# Patient Record
Sex: Female | Born: 1955 | Race: Black or African American | Hispanic: No | Marital: Married | Smoking: Never smoker
Health system: Southern US, Community
[De-identification: ages and names within clinical notes are randomized; demographics above are authoritative.]

## PROBLEM LIST (undated history)

## (undated) DIAGNOSIS — D649 Anemia, unspecified: Secondary | ICD-10-CM

## (undated) DIAGNOSIS — E785 Hyperlipidemia, unspecified: Secondary | ICD-10-CM

## (undated) DIAGNOSIS — IMO0001 Reserved for inherently not codable concepts without codable children: Secondary | ICD-10-CM

## (undated) DIAGNOSIS — F419 Anxiety disorder, unspecified: Secondary | ICD-10-CM

## (undated) DIAGNOSIS — M109 Gout, unspecified: Secondary | ICD-10-CM

## (undated) DIAGNOSIS — Z794 Long term (current) use of insulin: Secondary | ICD-10-CM

## (undated) DIAGNOSIS — F909 Attention-deficit hyperactivity disorder, unspecified type: Secondary | ICD-10-CM

## (undated) DIAGNOSIS — M1990 Unspecified osteoarthritis, unspecified site: Secondary | ICD-10-CM

## (undated) DIAGNOSIS — L219 Seborrheic dermatitis, unspecified: Secondary | ICD-10-CM

## (undated) DIAGNOSIS — G5601 Carpal tunnel syndrome, right upper limb: Secondary | ICD-10-CM

## (undated) DIAGNOSIS — F319 Bipolar disorder, unspecified: Secondary | ICD-10-CM

## (undated) DIAGNOSIS — I10 Essential (primary) hypertension: Secondary | ICD-10-CM

## (undated) DIAGNOSIS — E119 Type 2 diabetes mellitus without complications: Secondary | ICD-10-CM

## (undated) DIAGNOSIS — F329 Major depressive disorder, single episode, unspecified: Secondary | ICD-10-CM

## (undated) DIAGNOSIS — E1140 Type 2 diabetes mellitus with diabetic neuropathy, unspecified: Secondary | ICD-10-CM

## (undated) DIAGNOSIS — K219 Gastro-esophageal reflux disease without esophagitis: Secondary | ICD-10-CM

## (undated) DIAGNOSIS — G4730 Sleep apnea, unspecified: Secondary | ICD-10-CM

## (undated) DIAGNOSIS — H269 Unspecified cataract: Secondary | ICD-10-CM

## (undated) DIAGNOSIS — M2040 Other hammer toe(s) (acquired), unspecified foot: Secondary | ICD-10-CM

## (undated) DIAGNOSIS — F32A Depression, unspecified: Secondary | ICD-10-CM

## (undated) DIAGNOSIS — D509 Iron deficiency anemia, unspecified: Secondary | ICD-10-CM

## (undated) HISTORY — DX: Major depressive disorder, single episode, unspecified: F32.9

## (undated) HISTORY — PX: BREAST REDUCTION SURGERY: SHX8

## (undated) HISTORY — DX: Unspecified osteoarthritis, unspecified site: M19.90

## (undated) HISTORY — DX: Seborrheic dermatitis, unspecified: L21.9

## (undated) HISTORY — DX: Bipolar disorder, unspecified: F31.9

## (undated) HISTORY — DX: Other hammer toe(s) (acquired), unspecified foot: M20.40

## (undated) HISTORY — DX: Depression, unspecified: F32.A

## (undated) HISTORY — DX: Hyperlipidemia, unspecified: E78.5

## (undated) HISTORY — DX: Gastro-esophageal reflux disease without esophagitis: K21.9

## (undated) HISTORY — DX: Iron deficiency anemia, unspecified: D50.9

## (undated) HISTORY — DX: Gout, unspecified: M10.9

## (undated) HISTORY — DX: Anemia, unspecified: D64.9

## (undated) HISTORY — DX: Sleep apnea, unspecified: G47.30

---

## 1998-11-25 ENCOUNTER — Encounter: Admission: RE | Admit: 1998-11-25 | Discharge: 1999-02-23 | Payer: Self-pay | Admitting: Specialist

## 1998-12-31 ENCOUNTER — Ambulatory Visit (HOSPITAL_COMMUNITY): Admission: RE | Admit: 1998-12-31 | Discharge: 1998-12-31 | Payer: Self-pay | Admitting: Obstetrics and Gynecology

## 1998-12-31 ENCOUNTER — Encounter: Payer: Self-pay | Admitting: Obstetrics and Gynecology

## 1999-10-07 ENCOUNTER — Ambulatory Visit (HOSPITAL_COMMUNITY): Admission: RE | Admit: 1999-10-07 | Discharge: 1999-10-07 | Payer: Self-pay | Admitting: Family Medicine

## 1999-10-07 ENCOUNTER — Encounter: Payer: Self-pay | Admitting: Family Medicine

## 1999-11-09 ENCOUNTER — Encounter (INDEPENDENT_AMBULATORY_CARE_PROVIDER_SITE_OTHER): Payer: Self-pay

## 1999-11-09 ENCOUNTER — Other Ambulatory Visit: Admission: RE | Admit: 1999-11-09 | Discharge: 1999-11-09 | Payer: Self-pay | Admitting: Obstetrics and Gynecology

## 2000-01-02 ENCOUNTER — Encounter: Payer: Self-pay | Admitting: Obstetrics and Gynecology

## 2000-01-02 ENCOUNTER — Ambulatory Visit (HOSPITAL_COMMUNITY): Admission: RE | Admit: 2000-01-02 | Discharge: 2000-01-02 | Payer: Self-pay | Admitting: Obstetrics and Gynecology

## 2000-02-27 ENCOUNTER — Encounter: Admission: RE | Admit: 2000-02-27 | Discharge: 2000-05-27 | Payer: Self-pay | Admitting: *Deleted

## 2001-01-03 ENCOUNTER — Encounter: Payer: Self-pay | Admitting: Obstetrics and Gynecology

## 2001-01-03 ENCOUNTER — Ambulatory Visit (HOSPITAL_COMMUNITY): Admission: RE | Admit: 2001-01-03 | Discharge: 2001-01-03 | Payer: Self-pay | Admitting: Obstetrics and Gynecology

## 2001-01-07 ENCOUNTER — Other Ambulatory Visit: Admission: RE | Admit: 2001-01-07 | Discharge: 2001-01-07 | Payer: Self-pay | Admitting: Obstetrics and Gynecology

## 2002-01-13 ENCOUNTER — Other Ambulatory Visit: Admission: RE | Admit: 2002-01-13 | Discharge: 2002-01-13 | Payer: Self-pay | Admitting: Obstetrics and Gynecology

## 2002-01-13 ENCOUNTER — Ambulatory Visit (HOSPITAL_COMMUNITY): Admission: RE | Admit: 2002-01-13 | Discharge: 2002-01-13 | Payer: Self-pay | Admitting: Obstetrics and Gynecology

## 2002-01-13 ENCOUNTER — Encounter: Payer: Self-pay | Admitting: Obstetrics and Gynecology

## 2002-12-19 ENCOUNTER — Ambulatory Visit (HOSPITAL_COMMUNITY): Admission: RE | Admit: 2002-12-19 | Discharge: 2002-12-19 | Payer: Self-pay | Admitting: Family Medicine

## 2002-12-19 ENCOUNTER — Encounter: Payer: Self-pay | Admitting: Family Medicine

## 2003-01-15 ENCOUNTER — Encounter: Payer: Self-pay | Admitting: Obstetrics and Gynecology

## 2003-01-15 ENCOUNTER — Ambulatory Visit (HOSPITAL_COMMUNITY): Admission: RE | Admit: 2003-01-15 | Discharge: 2003-01-15 | Payer: Self-pay | Admitting: Obstetrics and Gynecology

## 2004-01-18 ENCOUNTER — Ambulatory Visit (HOSPITAL_COMMUNITY): Admission: RE | Admit: 2004-01-18 | Discharge: 2004-01-18 | Payer: Self-pay | Admitting: Obstetrics and Gynecology

## 2004-02-03 HISTORY — PX: ESOPHAGOGASTRODUODENOSCOPY: SHX1529

## 2004-02-22 ENCOUNTER — Other Ambulatory Visit: Admission: RE | Admit: 2004-02-22 | Discharge: 2004-02-22 | Payer: Self-pay | Admitting: Obstetrics and Gynecology

## 2004-05-23 ENCOUNTER — Encounter: Payer: Self-pay | Admitting: Gastroenterology

## 2004-05-23 LAB — HM COLONOSCOPY

## 2004-06-03 ENCOUNTER — Encounter: Payer: Self-pay | Admitting: Gastroenterology

## 2004-06-15 ENCOUNTER — Ambulatory Visit (HOSPITAL_COMMUNITY): Admission: RE | Admit: 2004-06-15 | Discharge: 2004-06-15 | Payer: Self-pay | Admitting: Gastroenterology

## 2004-07-11 ENCOUNTER — Ambulatory Visit: Payer: Self-pay | Admitting: Endocrinology

## 2004-11-10 ENCOUNTER — Ambulatory Visit: Payer: Self-pay | Admitting: Endocrinology

## 2004-11-18 ENCOUNTER — Ambulatory Visit: Payer: Self-pay | Admitting: Gastroenterology

## 2005-01-09 ENCOUNTER — Ambulatory Visit (HOSPITAL_COMMUNITY): Admission: RE | Admit: 2005-01-09 | Discharge: 2005-01-09 | Payer: Self-pay | Admitting: Obstetrics and Gynecology

## 2005-01-30 ENCOUNTER — Ambulatory Visit: Payer: Self-pay | Admitting: Endocrinology

## 2005-02-10 ENCOUNTER — Other Ambulatory Visit: Admission: RE | Admit: 2005-02-10 | Discharge: 2005-02-10 | Payer: Self-pay | Admitting: Obstetrics and Gynecology

## 2005-03-28 ENCOUNTER — Emergency Department (HOSPITAL_COMMUNITY): Admission: EM | Admit: 2005-03-28 | Discharge: 2005-03-28 | Payer: Self-pay | Admitting: Emergency Medicine

## 2005-04-07 ENCOUNTER — Ambulatory Visit (HOSPITAL_COMMUNITY): Admission: RE | Admit: 2005-04-07 | Discharge: 2005-04-07 | Payer: Self-pay | Admitting: *Deleted

## 2005-04-12 ENCOUNTER — Encounter: Admission: RE | Admit: 2005-04-12 | Discharge: 2005-07-11 | Payer: Self-pay | Admitting: *Deleted

## 2005-04-24 ENCOUNTER — Ambulatory Visit (HOSPITAL_COMMUNITY): Admission: RE | Admit: 2005-04-24 | Discharge: 2005-04-24 | Payer: Self-pay | Admitting: *Deleted

## 2005-05-22 ENCOUNTER — Ambulatory Visit: Payer: Self-pay | Admitting: Endocrinology

## 2005-05-26 ENCOUNTER — Ambulatory Visit: Payer: Self-pay | Admitting: Psychology

## 2005-05-29 ENCOUNTER — Ambulatory Visit: Payer: Self-pay | Admitting: Endocrinology

## 2005-06-14 ENCOUNTER — Ambulatory Visit: Payer: Self-pay | Admitting: Psychology

## 2005-06-19 ENCOUNTER — Ambulatory Visit: Payer: Self-pay | Admitting: Endocrinology

## 2005-08-07 ENCOUNTER — Ambulatory Visit: Payer: Self-pay | Admitting: Psychology

## 2005-09-19 ENCOUNTER — Ambulatory Visit: Payer: Self-pay | Admitting: Endocrinology

## 2005-10-23 ENCOUNTER — Ambulatory Visit: Payer: Self-pay | Admitting: Endocrinology

## 2005-11-22 ENCOUNTER — Encounter: Admission: RE | Admit: 2005-11-22 | Discharge: 2005-12-19 | Payer: Self-pay | Admitting: *Deleted

## 2005-12-05 ENCOUNTER — Encounter (INDEPENDENT_AMBULATORY_CARE_PROVIDER_SITE_OTHER): Payer: Self-pay | Admitting: *Deleted

## 2005-12-05 ENCOUNTER — Ambulatory Visit (HOSPITAL_COMMUNITY): Admission: RE | Admit: 2005-12-05 | Discharge: 2005-12-06 | Payer: Self-pay | Admitting: *Deleted

## 2005-12-05 HISTORY — PX: LAPAROSCOPIC GASTRIC BANDING: SHX1100

## 2005-12-21 ENCOUNTER — Ambulatory Visit: Payer: Self-pay | Admitting: Endocrinology

## 2006-01-25 ENCOUNTER — Ambulatory Visit (HOSPITAL_COMMUNITY): Admission: RE | Admit: 2006-01-25 | Discharge: 2006-01-25 | Payer: Self-pay | Admitting: Obstetrics and Gynecology

## 2006-03-09 ENCOUNTER — Ambulatory Visit: Payer: Self-pay | Admitting: Endocrinology

## 2006-03-12 ENCOUNTER — Ambulatory Visit: Payer: Self-pay | Admitting: Endocrinology

## 2006-04-16 ENCOUNTER — Other Ambulatory Visit: Admission: RE | Admit: 2006-04-16 | Discharge: 2006-04-16 | Payer: Self-pay | Admitting: Obstetrics and Gynecology

## 2006-06-12 ENCOUNTER — Ambulatory Visit: Payer: Self-pay | Admitting: Endocrinology

## 2006-06-12 LAB — CONVERTED CEMR LAB
Hgb A1c MFr Bld: 7.4 % — ABNORMAL HIGH (ref 4.6–6.0)
Microalb, Ur: 1.8 mg/dL (ref 0.0–1.9)

## 2006-09-03 ENCOUNTER — Ambulatory Visit: Payer: Self-pay | Admitting: Endocrinology

## 2006-10-04 ENCOUNTER — Ambulatory Visit: Payer: Self-pay | Admitting: Internal Medicine

## 2006-11-15 ENCOUNTER — Ambulatory Visit: Payer: Self-pay | Admitting: Endocrinology

## 2007-01-14 ENCOUNTER — Ambulatory Visit: Payer: Self-pay | Admitting: Endocrinology

## 2007-01-28 ENCOUNTER — Ambulatory Visit (HOSPITAL_COMMUNITY): Admission: RE | Admit: 2007-01-28 | Discharge: 2007-01-28 | Payer: Self-pay | Admitting: Obstetrics and Gynecology

## 2007-03-14 ENCOUNTER — Encounter: Payer: Self-pay | Admitting: Endocrinology

## 2007-03-14 DIAGNOSIS — E1149 Type 2 diabetes mellitus with other diabetic neurological complication: Secondary | ICD-10-CM | POA: Insufficient documentation

## 2007-03-14 DIAGNOSIS — F329 Major depressive disorder, single episode, unspecified: Secondary | ICD-10-CM | POA: Insufficient documentation

## 2007-03-14 DIAGNOSIS — F3289 Other specified depressive episodes: Secondary | ICD-10-CM | POA: Insufficient documentation

## 2007-03-14 DIAGNOSIS — J309 Allergic rhinitis, unspecified: Secondary | ICD-10-CM | POA: Insufficient documentation

## 2007-03-14 DIAGNOSIS — I10 Essential (primary) hypertension: Secondary | ICD-10-CM | POA: Insufficient documentation

## 2007-04-15 ENCOUNTER — Ambulatory Visit: Payer: Self-pay | Admitting: Endocrinology

## 2007-04-15 LAB — CONVERTED CEMR LAB
Hgb A1c MFr Bld: 7.3 % — ABNORMAL HIGH (ref 4.6–6.0)
Sed Rate: 28 mm/hr — ABNORMAL HIGH (ref 0–25)

## 2007-07-15 ENCOUNTER — Ambulatory Visit: Payer: Self-pay | Admitting: Endocrinology

## 2007-07-15 LAB — CONVERTED CEMR LAB: Hgb A1c MFr Bld: 7.3 % — ABNORMAL HIGH (ref 4.6–6.0)

## 2007-10-14 ENCOUNTER — Ambulatory Visit: Payer: Self-pay | Admitting: Endocrinology

## 2007-10-14 LAB — CONVERTED CEMR LAB: Microalb, Ur: 1.4 mg/dL (ref 0.0–1.9)

## 2007-12-23 ENCOUNTER — Encounter: Payer: Self-pay | Admitting: Endocrinology

## 2008-01-13 ENCOUNTER — Ambulatory Visit: Payer: Self-pay | Admitting: Endocrinology

## 2008-01-13 LAB — CONVERTED CEMR LAB: Hgb A1c MFr Bld: 7.6 % — ABNORMAL HIGH (ref 4.6–6.0)

## 2008-01-29 ENCOUNTER — Ambulatory Visit (HOSPITAL_COMMUNITY): Admission: RE | Admit: 2008-01-29 | Discharge: 2008-01-29 | Payer: Self-pay | Admitting: Obstetrics and Gynecology

## 2008-04-20 ENCOUNTER — Ambulatory Visit: Payer: Self-pay | Admitting: Endocrinology

## 2008-04-20 DIAGNOSIS — R209 Unspecified disturbances of skin sensation: Secondary | ICD-10-CM | POA: Insufficient documentation

## 2008-04-20 LAB — CONVERTED CEMR LAB
Hgb A1c MFr Bld: 7.8 % — ABNORMAL HIGH (ref 4.6–6.0)
Vitamin B-12: 240 pg/mL (ref 211–911)

## 2008-04-30 ENCOUNTER — Encounter: Payer: Self-pay | Admitting: Endocrinology

## 2008-07-13 ENCOUNTER — Ambulatory Visit: Payer: Self-pay | Admitting: Endocrinology

## 2008-07-13 LAB — CONVERTED CEMR LAB: Hgb A1c MFr Bld: 8.6 % — ABNORMAL HIGH (ref 4.6–6.0)

## 2008-11-30 ENCOUNTER — Ambulatory Visit: Payer: Self-pay | Admitting: Endocrinology

## 2008-11-30 LAB — CONVERTED CEMR LAB: Hgb A1c MFr Bld: 8.7 % — ABNORMAL HIGH (ref 4.6–6.5)

## 2008-12-04 ENCOUNTER — Encounter: Admission: RE | Admit: 2008-12-04 | Discharge: 2008-12-04 | Payer: Self-pay | Admitting: Emergency Medicine

## 2009-01-18 ENCOUNTER — Ambulatory Visit: Payer: Self-pay | Admitting: Endocrinology

## 2009-01-18 DIAGNOSIS — G4730 Sleep apnea, unspecified: Secondary | ICD-10-CM | POA: Insufficient documentation

## 2009-01-19 LAB — CONVERTED CEMR LAB
Folate: 4.7 ng/mL
Vitamin B-12: 295 pg/mL (ref 211–911)

## 2009-01-21 LAB — CONVERTED CEMR LAB: PTH: 70.8 pg/mL (ref 14.0–72.0)

## 2009-02-01 ENCOUNTER — Ambulatory Visit (HOSPITAL_COMMUNITY): Admission: RE | Admit: 2009-02-01 | Discharge: 2009-02-01 | Payer: Self-pay | Admitting: Family Medicine

## 2009-03-08 ENCOUNTER — Ambulatory Visit: Payer: Self-pay | Admitting: Endocrinology

## 2009-03-08 ENCOUNTER — Telehealth: Payer: Self-pay | Admitting: Endocrinology

## 2009-04-01 ENCOUNTER — Telehealth: Payer: Self-pay | Admitting: Endocrinology

## 2009-04-07 ENCOUNTER — Telehealth (INDEPENDENT_AMBULATORY_CARE_PROVIDER_SITE_OTHER): Payer: Self-pay | Admitting: *Deleted

## 2009-04-15 ENCOUNTER — Encounter: Payer: Self-pay | Admitting: Endocrinology

## 2009-06-07 ENCOUNTER — Ambulatory Visit: Payer: Self-pay | Admitting: Endocrinology

## 2009-06-07 LAB — CONVERTED CEMR LAB: Microalb, Ur: 1.5 mg/dL (ref 0.0–1.9)

## 2009-07-01 ENCOUNTER — Telehealth: Payer: Self-pay | Admitting: Endocrinology

## 2009-07-28 LAB — CONVERTED CEMR LAB

## 2009-08-28 LAB — CONVERTED CEMR LAB: Pap Smear: NORMAL

## 2009-09-20 ENCOUNTER — Ambulatory Visit: Payer: Self-pay | Admitting: Endocrinology

## 2009-12-20 ENCOUNTER — Ambulatory Visit: Payer: Self-pay | Admitting: Endocrinology

## 2009-12-20 DIAGNOSIS — R609 Edema, unspecified: Secondary | ICD-10-CM | POA: Insufficient documentation

## 2009-12-20 LAB — CONVERTED CEMR LAB
BUN: 6 mg/dL (ref 6–23)
Bilirubin Urine: NEGATIVE
CO2: 31 meq/L (ref 19–32)
GFR calc non Af Amer: 84.52 mL/min (ref >60)
Glucose, Bld: 91 mg/dL (ref 70–99)
Leukocytes, UA: NEGATIVE
Nitrite: NEGATIVE
Potassium: 4.2 meq/L (ref 3.5–5.1)
pH: 6 (ref 5.0–8.0)

## 2010-01-26 LAB — HM MAMMOGRAPHY: HM Mammogram: NORMAL

## 2010-02-07 ENCOUNTER — Ambulatory Visit (HOSPITAL_COMMUNITY): Admission: RE | Admit: 2010-02-07 | Discharge: 2010-02-07 | Payer: Self-pay | Admitting: Family Medicine

## 2010-03-04 ENCOUNTER — Telehealth: Payer: Self-pay | Admitting: Endocrinology

## 2010-03-14 ENCOUNTER — Ambulatory Visit: Payer: Self-pay | Admitting: Endocrinology

## 2010-03-14 LAB — CONVERTED CEMR LAB: Hgb A1c MFr Bld: 6.7 % — ABNORMAL HIGH (ref 4.6–6.5)

## 2010-03-22 ENCOUNTER — Telehealth: Payer: Self-pay | Admitting: Endocrinology

## 2010-09-01 ENCOUNTER — Encounter: Payer: Self-pay | Admitting: Gastroenterology

## 2010-09-17 ENCOUNTER — Encounter: Payer: Self-pay | Admitting: Gastroenterology

## 2010-09-29 NOTE — Letter (Signed)
Summary: New Patient letter  [facility]  [address] [place], [place] [address]   Phone: [phone]  Fax: [phone]       09/01/2010 MRN: [id number]  Amy [name] [address] [place], [place]  [address]  Dear Ms. Som,  Welcome to the Gastroenterology Division at [facility].    You are scheduled to see Dr.  Arlyce Dice on 10-10-10 at 2:15pm on the 3rd floor at [facility], [address]  We ask that you try to arrive at our office 15 minutes prior to your appointment time to allow for check-in.  We would like you to complete the enclosed self-administered evaluation form prior to your visit and bring it with you on the day of your appointment.  We will review it with you.  Also, please bring a complete list of all your medications or, if you prefer, bring the medication bottles and we will list them.  Please bring your insurance card so that we may make a copy of it.  If your insurance requires a referral to see a specialist, please bring your referral form from your primary care physician.  Co-payments are due at the time of your visit and may be paid by cash, check or credit card.     Your office visit will consist of a consult with your physician (includes a physical exam), any laboratory testing he/she may order, scheduling of any necessary diagnostic testing (e.g. x-ray, ultrasound, CT-scan), and scheduling of a procedure (e.g. Endoscopy, Colonoscopy) if required.  Please allow enough time on your schedule to allow for any/all of these possibilities.    If you cannot keep your appointment, please call [phone] to cancel or reschedule prior to your appointment date.  This allows [place] the opportunity to schedule an appointment for another patient in need of care.  If you do not cancel or reschedule by 5 p.m. the business day prior to your appointment date, you will be charged a $50.00 late cancellation/no-show fee.    Thank you for choosing  [facility] for your medical needs.  We appreciate the opportunity to care for you.  Please visit [place] at our website  to learn more about our practice.                     Sincerely,                                                             The Gastroenterology Division

## 2010-09-29 NOTE — Assessment & Plan Note (Signed)
Summary: 3 MTH FU  STC   Vital Signs:  Patient profile:   55 year old female Height:      65 inches (165.10 cm) Weight:      181 pounds (82.27 kg) BMI:     30.23 O2 Sat:      98 % on Room air Temp:     98.0 degrees F (36.67 degrees C) oral Pulse rate:   85 / minute BP sitting:   118 / 80  (left arm) Cuff size:   regular  Vitals Entered By: Josph Macho RMA (December 20, 2009 10:05 AM)  O2 Flow:  Room air CC: 3 month follow up/ CF Is Patient Diabetic? Yes   Primary Provider:  c. white  CC:  3 month follow up/ CF.  History of Present Illness: pt says she has regained some of her lost weight.  no cbg record, but states cbg's vary from 78-369.  it is highest at hs, and lowest in am. pt states 3 days of moderate swelling of the legs, but no associated sob.  Current Medications (verified): 1)  Humalog Pen 100 Unit/ml  Soln (Insulin Lispro (Human)) .... (Just Before Each Meal) 0-35-15 Units 2)  Adult Aspirin Low Strength 81 Mg  Tbdp (Aspirin) .... Take 1 By Mouth Qd 3)  Zyrtec Allergy 10 Mg  Tabs (Cetirizine Hcl) .... Take 1 By Mouth Qd 4)  Imipramine Pamoate 150 Mg  Caps (Imipramine Pamoate) .... Take 4 By Mouth Qhs 5)  Micardis 80 Mg  Tabs (Telmisartan) .... Take 1 By Mouth Qd 6)  Vytorin 10-80 Mg  Tabs (Ezetimibe-Simvastatin) .... Take 1 By Mouth Qd 7)  Wellbutrin Sr 150 Mg  Tb12 (Bupropion Hcl) .... Takje 1 By Mouth Qd 8)  Mobic 15 Mg  Tabs (Meloxicam) .... Take1 By Mouth Qd 9)  Rhinocort Aqua 32 Mcg/act  Susp (Budesonide (Nasal)) .... Take Prn 10)  Lantus 100 Unit/ml Soln (Insulin Glargine) .Marland Kitchen.. 15 Units At Bedtime 11)  Metformin Hcl 500 Mg Xr24h-Tab (Metformin Hcl) .Marland Kitchen.. 1 Bid 12)  Seroquel .Marland Kitchen.. 1 Daily 13)  Adderall .... 2 Tabs Daily 14)  Effexor 300 Mg .Marland KitchenMarland Kitchen. 1 Daily 15)  Atenolol 15 Mg .Marland KitchenMarland Kitchen. 1 Daily 16)  Accu-Chek Aviva  Strp (Glucose Blood) .... 3 Times A Day, and Lancets 250.01  Allergies (verified): No Known Drug Allergies  Review of Systems  The patient denies  hypoglycemia and chest pain.    Physical Exam  General:  obese.  no distress  Pulses:  dorsalis pedis intact bilat.  Extremities:  no deformity.  no ulcer on the feet.  feet are of normal color and temp.   2+ right pedal edema and 2+ left pedal edema.   Neurologic:  sensation is intact to touch on the feet, but slightly decreased from normal  Additional Exam:  Hemoglobin A1C       [H]  7.0 %                       4.6-6.5        Sodium                    143 mEq/L                   135-145   Potassium                 4.2 mEq/L  3.5-5.1   Chloride                  104 mEq/L                   96-112   Carbon Dioxide            31 mEq/L                    19-32   Glucose                   91 mg/dL                    16-10   BUN                       6 mg/dL                     9-60   Creatinine                0.9 mg/dL                   4.5-4.0   Calcium                   8.7 mg/dL                   9.8-11.9     B-Type Natriuetic Peptide        [H]  134.0 pg/mL    Impression & Recommendations:  Problem # 1:  DIABETES MELLITUS, TYPE II (ICD-250.00) she needs slight adjustment in her therapy  Problem # 2:  EDEMA (ICD-782.3) Assessment: New uncertain etiology  Medications Added to Medication List This Visit: 1)  Humalog Pen 100 Unit/ml Soln (Insulin lispro (human)) .... (just before each meal) 0-35-25 units 2)  Lantus 100 Unit/ml Soln (Insulin glargine) .Marland Kitchen.. 10 units at bedtime 3)  Furosemide 20 Mg Tabs (Furosemide) .Marland Kitchen.. 1 once daily  Other Orders: T-D-Dimer Fibrin Derivatives Quantitive (14782-95621) TLB-A1C / Hgb A1C (Glycohemoglobin) (83036-A1C) TLB-BMP (Basic Metabolic Panel-BMET) (80048-METABOL) TLB-BNP (B-Natriuretic Peptide) (83880-BNPR) TLB-Udip w/ Micro (81001-URINE) Est. Patient Level IV (30865)  Patient Instructions: 1)  tests are being ordered for you today. please call [phone] to hear your test results 2)  pending the test results, please: 3)   increase humalog to (just before each meal) 0-35-25 units 4)  reduce lantus to 10 units qhs. 5)  return 3 mos. 6)  check your blood glucose 1 time a day.  vary the time of day between before the 3 meals and at bedtime.  also check if you feel as though your glucose might be very high or too low.  bring a record of this to your doctor appointments. 7)  please see dr white to follow-up the edema 8)  (update: i left message on phone-tree:  add lasix 20/day--i'll drop back and take care of just the dm). Prescriptions: FUROSEMIDE 20 MG TABS (FUROSEMIDE) 1 once daily  #90 x 3   Entered and Authorized by:   Minus Breeding MD   Signed by:   Minus Breeding MD on 12/21/2009   Method used:   Electronically to        [facility]* (mail-order)             ,          Ph: [phone]  Fax: [phone]   RxID:   [id number]

## 2010-09-29 NOTE — Progress Notes (Signed)
Summary: Rx refill req  Phone Note Refill Request Message from:  Patient on March 22, 2010 4:01 PM  Refills Requested: Medication #1:  ACCU-CHEK AVIVA  STRP 3 times a day   Dosage confirmed as above?Dosage Confirmed   Supply Requested: 9 months To [facility]   Method Requested: Electronic Initial call taken by: Margaret Pyle, CMA,  March 22, 2010 4:02 PM    Prescriptions: ACCU-CHEK AVIVA  STRP (GLUCOSE BLOOD) 3 times a day, and lancets 250.01  #100 x 2   Entered by:   Margaret Pyle, CMA   Authorized by:   Minus Breeding MD   Signed by:   Margaret Pyle, CMA on 03/22/2010   Method used:   Electronically to        [facility]* (retail)       [address] [place], [place]  [address]       Ph: [phone]       Fax: [phone]   RxID:   [id number]

## 2010-09-29 NOTE — Assessment & Plan Note (Signed)
Summary: 3 MTH FU  STC   Vital Signs:  Patient profile:   55 year old female Height:      65 inches (165.10 cm) Weight:      173 pounds (78.64 kg) BMI:     28.89 O2 Sat:      98 % on Room air Temp:     98.0 degrees F (36.67 degrees C) oral Pulse rate:   82 / minute Pulse rhythm:   regular BP sitting:   140 / 88 Cuff size:   regular  Vitals Entered By: Brenton Grills MA (March 14, 2010 9:23 AM)  O2 Flow:  Room air CC: 3 mo F/U/pt is no longer taking Seroquel/aj   Primary Provider:  c. white  CC:  3 mo F/U/pt is no longer taking Seroquel/aj.  History of Present Illness: pt states she feels well in general.  no cbg record, but states cbg's are well-controlled.  it is lowest in am, and higest in the afternoon.  she has lost a few more lbs, and is aproaching her post-lap-band-low of 165 (6 mos ago).  Current Medications (verified): 1)  Humalog Pen 100 Unit/ml  Soln (Insulin Lispro (Human)) .... (Just Before Each Meal) 0-35-25 Units 2)  Adult Aspirin Low Strength 81 Mg  Tbdp (Aspirin) .... Take 1 By Mouth Qd 3)  Zyrtec Allergy 10 Mg  Tabs (Cetirizine Hcl) .... Take 1 By Mouth Qd 4)  Imipramine Pamoate 150 Mg  Caps (Imipramine Pamoate) .... Take 4 By Mouth Qhs 5)  Micardis 80 Mg  Tabs (Telmisartan) .... Take 1 By Mouth Qd 6)  Vytorin 10-80 Mg  Tabs (Ezetimibe-Simvastatin) .... Take 1 By Mouth Qd 7)  Wellbutrin Sr 150 Mg  Tb12 (Bupropion Hcl) .... Takje 1 By Mouth Qd 8)  Mobic 15 Mg  Tabs (Meloxicam) .... Take1 By Mouth Qd 9)  Rhinocort Aqua 32 Mcg/act  Susp (Budesonide (Nasal)) .... Take Prn 10)  Lantus 100 Unit/ml Soln (Insulin Glargine) .Marland Kitchen.. 10 Units At Bedtime 11)  Metformin Hcl 500 Mg Xr24h-Tab (Metformin Hcl) .Marland Kitchen.. 1 Bid 12)  Seroquel .Marland Kitchen.. 1 Daily 13)  Adderall .... 2 Tabs Daily 14)  Effexor 300 Mg .Marland KitchenMarland Kitchen. 1 Daily 15)  Atenolol 15 Mg .Marland KitchenMarland Kitchen. 1 Daily 16)  Accu-Chek Aviva  Strp (Glucose Blood) .... 3 Times A Day, and Lancets 250.01 17)  Furosemide 20 Mg Tabs (Furosemide) .Marland Kitchen.. 1 Once  Daily 18)  Abilify 15 Mg Tabs (Aripiprazole) .Marland Kitchen.. 1 Tablet By Mouth Once Daily 19)  Vitamin D (Ergocalciferol) 50000 Unit Caps (Ergocalciferol) .Marland Kitchen.. 1 Tablet By Mouth Twice A Week  Allergies (verified): No Known Drug Allergies  Past History:  Past Medical History: Last updated: 03/14/2007 Allergic rhinitis Depression Diabetes mellitus, type II Hypertension DM Painful periphral neuropathy Dyslipidemia OA Knee's Sleep Apnea CTS  Past Surgical History: Reviewed history from 03/14/2007 and no changes required. Lap Band (2007) Edg (06/03/2004) Lower Arterial (02/03/2004) Treadmill Cardiolite (2002)  Review of Systems       denies n/v  Physical Exam  General:  normal appearance.   Skin:  insulin injection sites at right anterior thigh are normal  Additional Exam:   Hemoglobin A1C       [H]  6.7 %     Impression & Recommendations:  Problem # 1:  DIABETES MELLITUS, TYPE II (ICD-250.00) weight-loss is reducing insulin requirements  Medications Added to Medication List This Visit: 1)  Abilify 15 Mg Tabs (Aripiprazole) .Marland Kitchen.. 1 tablet by mouth once daily 2)  Vitamin D (ergocalciferol) 50000  Unit Caps (Ergocalciferol) .Marland Kitchen.. 1 tablet by mouth twice a week  Other Orders: TLB-A1C / Hgb A1C (Glycohemoglobin) (83036-A1C) Est. Patient Level III ([address])  Patient Instructions: 1)  good diet and exercise habits significanly improve the control of your diabetes.  please let me know if you wish to be referred to a dietician.  high blood sugar is very risky to your health.  you should see an eye doctor every year. 2)  controlling your blood pressure and cholesterol drastically reduces the damage diabetes does to your body.  this also applies to quitting smoking.  please discuss these with your doctor.  you should take an aspirin every day, unless you have been advised by a doctor not to. 3)  blood tests are being ordered for you today.  please call [phone] to hear your test results. 4)   pending the test results, please continue the same insulin for now. 5)  (update: i left message on phone-tree:  stop lantus)   Preventive Care Screening  Mammogram:    Date:  01/26/2010    Results:  normal   Pap Smear:    Date:  08/28/2009    Results:  normal

## 2010-09-29 NOTE — Assessment & Plan Note (Signed)
Summary: 3 mth fu  stc   Vital Signs:  Patient profile:   55 year old female Height:      65 inches (165.10 cm) Weight:      160.50 pounds (72.95 kg) O2 Sat:      97 % on Room air Temp:     97.6 degrees F (36.44 degrees C) oral Pulse rate:   88 / minute BP sitting:   108 / 74  (left arm) Cuff size:   regular  Vitals Entered By: Josph Macho CMA (September 20, 2009 10:14 AM)  O2 Flow:  Room air CC: 3 month follow up/ CF Is Patient Diabetic? Yes   Primary Provider:  c. white  CC:  3 month follow up/ CF.  History of Present Illness: pt states she feels well in general.  she is almost at her goal weight of 150 lbs.  no cbg record, but states cbg's are well-controlled, except 200 after lunch.   Current Medications (verified): 1)  Humalog Pen 100 Unit/ml  Soln (Insulin Lispro (Human)) .... (Just Before Each Meal) 0-35-15 Units 2)  Adult Aspirin Low Strength 81 Mg  Tbdp (Aspirin) .... Take 1 By Mouth Qd 3)  Zyrtec Allergy 10 Mg  Tabs (Cetirizine Hcl) .... Take 1 By Mouth Qd 4)  Imipramine Pamoate 150 Mg  Caps (Imipramine Pamoate) .... Take 4 By Mouth Qhs 5)  Micardis 80 Mg  Tabs (Telmisartan) .... Take 1 By Mouth Qd 6)  Vytorin 10-80 Mg  Tabs (Ezetimibe-Simvastatin) .... Take 1 By Mouth Qd 7)  Wellbutrin Sr 150 Mg  Tb12 (Bupropion Hcl) .... Takje 1 By Mouth Qd 8)  Mobic 15 Mg  Tabs (Meloxicam) .... Take1 By Mouth Qd 9)  Rhinocort Aqua 32 Mcg/act  Susp (Budesonide (Nasal)) .... Take Prn 10)  Lantus 100 Unit/ml Soln (Insulin Glargine) .Marland Kitchen.. 15 Units At Bedtime 11)  Metformin Hcl 500 Mg Xr24h-Tab (Metformin Hcl) .Marland Kitchen.. 1 Bid 12)  Seroquel .Marland Kitchen.. 1 Daily 13)  Adderall .... 2 Tabs Daily 14)  Effexor 300 Mg .Marland KitchenMarland Kitchen. 1 Daily 15)  Atenolol 15 Mg .Marland KitchenMarland Kitchen. 1 Daily 16)  Accu-Chek Aviva  Strp (Glucose Blood) .... 3 Times A Day, and Lancets 250.01  Allergies (verified): No Known Drug Allergies  Past History:  Past Medical History: Last updated: 03/14/2007 Allergic rhinitis Depression Diabetes  mellitus, type II Hypertension DM Painful periphral neuropathy Dyslipidemia OA Knee's Sleep Apnea CTS  Review of Systems  The patient denies hypoglycemia.    Physical Exam  General:  normal appearance.   Pulses:  dorsalis pedis intact bilat.  no carotid bruit  Extremities:  no deformity.  no ulcer on the feet.  feet are of normal color and temp.  no edema.   Neurologic:  sensation is intact to touch on the feet, but slightly decreased from normal  Additional Exam:   Hemoglobin A1C       [H]  6.8 %    Impression & Recommendations:  Problem # 1:  DIABETES MELLITUS, TYPE II (ICD-250.00) well-controlled  Medications Added to Medication List This Visit: 1)  Lantus 100 Unit/ml Soln (Insulin glargine) .Marland Kitchen.. 15 units at bedtime  Other Orders: TLB-A1C / Hgb A1C (Glycohemoglobin) (83036-A1C) Est. Patient Level III (95621)  Patient Instructions: 1)  pending the test results, please continue the same medications for now: 2)  humalog (just before each meal) 0-35-15 units 3)  lantus (15 units at night). 4)  return 3 mos. 5)  check your blood glucose 1 time a day.  vary  the time of day between before the 3 meals and at bedtime.  also check if you feel as though your glucose might be very high or too low.  bring a record of this to your doctor appointments. 6)  tests are being ordered for you today.  a few days after the test(s), please call [phone] to hear your test results. 7)  (update: i left message on phone-tree:  rx as we discussed)  Preventive Care Screening  Pap Smear:    Date:  07/28/2009    Results:  historical   Mammogram:    Date:  01/26/2009    Results:  historical

## 2010-09-29 NOTE — Progress Notes (Signed)
Summary: Rx refill req  Phone Note Refill Request Message from:  Patient on March 04, 2010 11:11 AM  Refills Requested: Medication #1:  HUMALOG PEN 100 UNIT/ML  SOLN (just before each meal) 0-35-25 units   Dosage confirmed as above?Dosage Confirmed  Medication #2:  LANTUS 100 UNIT/ML SOLN 10 units at bedtime   Dosage confirmed as above?Dosage Confirmed Pt request refill of this medications today, before she goes leave for out of town.   Method Requested: Electronic Initial call taken by: Margaret Pyle, CMA,  March 04, 2010 11:11 AM    Prescriptions: LANTUS 100 UNIT/ML SOLN (INSULIN GLARGINE) 10 units at bedtime  #63mth x 5   Entered by:   Margaret Pyle, CMA   Authorized by:   Minus Breeding MD   Signed by:   Margaret Pyle, CMA on 03/04/2010   Method used:   Electronically to        [facility]  [address] [id number]* (retail)       [address]       [place], [place]  [address]       Ph: [phone] or [phone]       Fax: [phone]   RxID:   [id number] HUMALOG PEN 100 UNIT/ML  SOLN (INSULIN LISPRO (HUMAN)) (just before each meal) 0-35-25 units  #106mth x 5   Entered by:   Margaret Pyle, CMA   Authorized by:   Minus Breeding MD   Signed by:   Margaret Pyle, CMA on 03/04/2010   Method used:   [facility]        [facility]  [address] [phone][facility] (retail)       [address]       [place], [place]  [address]       Ph: [phone] or [phone]       Fax: [phone]   RxID:   [id number]

## 2010-10-10 ENCOUNTER — Encounter: Payer: Self-pay | Admitting: Gastroenterology

## 2010-10-10 ENCOUNTER — Ambulatory Visit (INDEPENDENT_AMBULATORY_CARE_PROVIDER_SITE_OTHER): Payer: 59 | Admitting: Gastroenterology

## 2010-10-10 ENCOUNTER — Other Ambulatory Visit: Payer: 59

## 2010-10-10 ENCOUNTER — Other Ambulatory Visit: Payer: Self-pay | Admitting: Gastroenterology

## 2010-10-10 ENCOUNTER — Encounter (INDEPENDENT_AMBULATORY_CARE_PROVIDER_SITE_OTHER): Payer: Self-pay | Admitting: *Deleted

## 2010-10-10 DIAGNOSIS — R1310 Dysphagia, unspecified: Secondary | ICD-10-CM | POA: Insufficient documentation

## 2010-10-10 DIAGNOSIS — K920 Hematemesis: Secondary | ICD-10-CM

## 2010-10-10 DIAGNOSIS — K219 Gastro-esophageal reflux disease without esophagitis: Secondary | ICD-10-CM | POA: Insufficient documentation

## 2010-10-10 LAB — CBC WITH DIFFERENTIAL/PLATELET
Eosinophils Absolute: 0.1 10*3/uL (ref 0.0–0.7)
HCT: 34.6 % — ABNORMAL LOW (ref 36.0–46.0)
MCHC: 33.4 g/dL (ref 30.0–36.0)
MCV: 89.1 fl (ref 78.0–100.0)
Monocytes Relative: 7.3 % (ref 3.0–12.0)
Neutro Abs: 5.1 10*3/uL (ref 1.4–7.7)
Neutrophils Relative %: 71.5 % (ref 43.0–77.0)
RDW: 15.8 % — ABNORMAL HIGH (ref 11.5–14.6)

## 2010-10-11 ENCOUNTER — Encounter: Payer: 59 | Admitting: Gastroenterology

## 2010-10-11 ENCOUNTER — Ambulatory Visit (HOSPITAL_COMMUNITY): Payer: 59

## 2010-10-11 ENCOUNTER — Encounter: Payer: Self-pay | Admitting: Gastroenterology

## 2010-10-11 ENCOUNTER — Ambulatory Visit (HOSPITAL_COMMUNITY)
Admission: RE | Admit: 2010-10-11 | Discharge: 2010-10-11 | Disposition: A | Discharge status: Home / Self Care | Payer: 59 | Source: Ambulatory Visit | Attending: Gastroenterology | Admitting: Gastroenterology

## 2010-10-11 DIAGNOSIS — R1310 Dysphagia, unspecified: Secondary | ICD-10-CM | POA: Insufficient documentation

## 2010-10-11 DIAGNOSIS — K2289 Other specified disease of esophagus: Secondary | ICD-10-CM

## 2010-10-11 DIAGNOSIS — K222 Esophageal obstruction: Secondary | ICD-10-CM | POA: Insufficient documentation

## 2010-10-11 DIAGNOSIS — Z9884 Bariatric surgery status: Secondary | ICD-10-CM | POA: Insufficient documentation

## 2010-10-11 DIAGNOSIS — K228 Other specified diseases of esophagus: Secondary | ICD-10-CM | POA: Insufficient documentation

## 2010-10-12 LAB — GLUCOSE, CAPILLARY: Glucose-Capillary: 109 mg/dL — ABNORMAL HIGH (ref 70–99)

## 2010-10-13 NOTE — Procedures (Signed)
Summary: Colonoscopy   Colonoscopy  Procedure date:  05/23/2004  Findings:      Results: Diverticulosis.       Location:  [facility].    Colonoscopy  Procedure date:  05/23/2004  Findings:      Results: Diverticulosis.       Location:  [facility].   Patient Name: Amy [name], Amy [name] MRN:  Procedure Procedures: Colonoscopy CPT: 08657.  Personnel: Endoscopist: Barbette Hair. Arlyce Dice, MD.  Indications  Evaluation of: Positive fecal occult blood test  History  Current Medications: Patient is not currently taking Coumadin.  Pre-Exam Physical: Performed May 23, 2004. Entire physical exam was normal.  Exam Exam: Extent of exam reached: Cecum, extent intended: Cecum.  The cecum was identified by IC valve. Colon retroflexion performed. ASA Classification: II. Tolerance: good.  Monitoring: Pulse and BP monitoring, Oximetry used. Supplemental O2 given. at 2 Liters.  Colon Prep Used Miralax for colon prep. Prep results: good.  Sedation Meds: Fentanyl 100 mcg. given IV. Versed 9 mg. given IV.  Findings DIVERTICULOSIS: Descending Colon. ICD9: Diverticulosis: 562.10. Comments: Rare tic.  NORMAL EXAM: Cecum.  NORMAL EXAM: Rectum.   Assessment Abnormal examination, see findings above.  Diagnoses: 562.10: Diverticulosis.   Events  Unplanned Interventions: No intervention was required.  Unplanned Events: There were no complications. Plans Patient Education: Patient given standard instructions for: Diverticulosis.  Scheduling/Referral: EGD, to Molly Maduro D. Arlyce Dice, MD, around May 26, 2004.    This report was created from the original endoscopy report, which was reviewed and signed by the above listed endoscopist.

## 2010-10-13 NOTE — Procedures (Signed)
Summary: EGD   EGD  Procedure date:  06/03/2004  Findings:      Findings: Normal  Location: [facility]   Patient Name: Amy [name], Amy [name] MRN:  Procedure Procedures: Panendoscopy (EGD) CPT: 43235.  Personnel: Endoscopist: Barbette Hair. Arlyce Dice, MD.  Indications  Evaluation of: Positive fecal occult blood test  History  Current Medications: Patient is not currently taking Coumadin.  Pre-Exam Physical: Performed Jun 03, 2004  Entire physical exam was normal.  Exam Exam Info: Maximum depth of insertion Duodenum, intended Duodenum. Vocal cords visualized. Gastric retroflexion performed. ASA Classification: I. Tolerance: excellent.  Sedation Meds: Robinul 0.2 given IV. Fentanyl 50 mcg. given IV. Versed 5 mg. given IV. Cetacaine Spray 2 sprays given aerosolized.  Monitoring: BP and pulse monitoring done. Oximetry used. Supplemental O2 given at 2 Liters.  Findings - Normal: Proximal Esophagus to Duodenal 2nd Portion.   Assessment Normal examination.  Events  Unplanned Intervention: No unplanned interventions were required.  Unplanned Events: There were no complications. Plans Patient Education: Patient given standard instructions for: a normal exam.  Scheduling: UGI/small bowel follow through, around Jun 08, 2004.  Office Visit, to [employer]. Arlyce Dice, MD, around Jun 17, 2004.    This report was created from the original endoscopy report, which was reviewed and signed by the above listed endoscopist.

## 2010-10-14 ENCOUNTER — Telehealth: Payer: Self-pay | Admitting: Gastroenterology

## 2010-10-18 ENCOUNTER — Ambulatory Visit (HOSPITAL_COMMUNITY)
Admit: 2010-10-18 | Discharge: 2010-10-18 | Disposition: A | Discharge status: Home / Self Care | Payer: 59 | Attending: Gastroenterology | Admitting: Gastroenterology

## 2010-10-18 ENCOUNTER — Other Ambulatory Visit (HOSPITAL_COMMUNITY): Payer: Self-pay | Admitting: Gastroenterology

## 2010-10-18 DIAGNOSIS — Z9884 Bariatric surgery status: Secondary | ICD-10-CM | POA: Insufficient documentation

## 2010-10-18 DIAGNOSIS — R1310 Dysphagia, unspecified: Secondary | ICD-10-CM | POA: Insufficient documentation

## 2010-10-18 DIAGNOSIS — R1110 Vomiting, unspecified: Secondary | ICD-10-CM | POA: Insufficient documentation

## 2010-10-19 NOTE — Letter (Signed)
Summary: EGD Instructions  [facility]  [address] [place], [place] [address]   Phone: [phone]  Fax: [phone]       Amy [name]    1956-05-02    MRN: [id number]       Procedure Day /Date:TUESDAY 10/11/2010     Arrival Time: 11:30AM     Procedure Time:12:30PM     Location of Procedure:                     X  [facility] ( Outpatient Registration)    PREPARATION FOR ENDOSCOPY/BALLOON   On 10/11/2010 THE DAY OF THE PROCEDURE:  1.   No solid foods, milk or milk products are allowed after midnight the night before your procedure.  2.   Do not drink anything colored red or purple.  Avoid juices with pulp.  No orange juice.  3.  You may drink clear liquids until 8:30AM , which is 4 hours before your procedure.                                                                                                CLEAR LIQUIDS INCLUDE: Water Jello Ice Popsicles Tea (sugar ok, no milk/cream) Powdered fruit flavored drinks Coffee (sugar ok, no milk/cream) Gatorade Juice: apple, white grape, white cranberry  Lemonade Clear bullion, consomm, broth Carbonated beverages (any kind) Strained chicken noodle soup Hard Candy   MEDICATION INSTRUCTIONS  Unless otherwise instructed, you should take regular prescription medications with a small sip of water as early as possible the morning of your procedure.  Diabetic patients - see separate instructions.             OTHER INSTRUCTIONS  You will need a responsible adult at least 53 years of age to accompany you and drive you home.   This person must remain in the waiting room during your procedure.  Wear loose fitting clothing that is easily removed.  Leave jewelry and other valuables at home.  However, you may wish to bring a book to read or an iPod/MP3 player to listen to music as you wait for your procedure to start.  Remove all body piercing jewelry and leave at home.  Total time from sign-in  until discharge is approximately 2-3 hours.  You should go home directly after your procedure and rest.  You can resume normal activities the day after your procedure.  The day of your procedure you should not:   Drive   Make legal decisions   Operate machinery   Drink alcohol   Return to work  You will receive specific instructions about eating, activities and medications before you leave.    The above instructions have been reviewed and explained to me by   _______________________    I fully understand and can verbalize these instructions _____________________________ Date _________

## 2010-10-19 NOTE — Progress Notes (Signed)
Summary: Questions  Phone Note Call from Patient Call back at Home Phone [phone] Call back at [phone]   Caller: Patient Call For: Dr. Arlyce Dice Reason for Call: Talk to Nurse Summary of Call: Pt has questions about her appt next week at [facility]? Initial call taken by: [place] Johnson,  October 14, 2010 2:39 PM  Follow-up for Phone Call        Patient asked questions about her procedure on 10/18/10 at [facility]. Per radiology scheduling she has an Upper GI scheduled for 10/18/10 @ 10:30am. They did not have an order- faxed to [phone]. LMOM for patient to call back. Graciella Freer RN  October 14, 2010 3:42 PM   Spoke with patient and gave her the instructions for her UGI. Patient stated understanding. Follow-up by: Graciella Freer RN,  October 14, 2010 4:15 PM

## 2010-10-19 NOTE — Procedures (Signed)
Summary: Upper Endoscopy  Patient: [name] Note: All result statuses are Final unless otherwise noted.  Tests: (1) Upper Endoscopy (EGD)   EGD Upper Endoscopy       DONE     [facility]     [address] [place], [place]  [address]           ENDOSCOPY PROCEDURE REPORT           PATIENT:  Amy, Amy [name]  MR#:  [id number]     BIRTHDATE:  29-Dec-1955, 54 yrs. old  GENDER:  female           ENDOSCOPIST:  Barbette Hair. Arlyce Dice, MD     Referred by:  Laurann Montana, M.D.           PROCEDURE DATE:  10/11/2010     PROCEDURE:  EGD, diagnostic 43235     ASA CLASS:  Class II     INDICATIONS:  dysphagia s/p lap band procedure           MEDICATIONS:   Fentanyl 40 mcg IV, Versed 4 mg IV, glycopyrrolate     (Robinal) 0.2 mg IV     TOPICAL ANESTHETIC:  Cetacaine Spray           DESCRIPTION OF PROCEDURE:   After the risks benefits and     alternatives of the procedure were thoroughly explained, informed     consent was obtained.  The EG-[id number] ([id number]) endoscope was     introduced through the mouth and advanced to the third portion of     the duodenum, without limitations.  The instrument was slowly     withdrawn as the mucosa was fully examined.     <<PROCEDUREIMAGES>>           A dilatation was found in the total esophagus (see image8).     Moderate dilation of entire esophagus  stenosis in the cardia.     Moderately severe stenosis in proximal cardia at 44cm,     corresponding to the lap band. Scope passed with resistance (see     image5). Inflamed mucosal in the cardia  Otherwise the examination     was normal (see image1, image3, and image4).    Retroflexed views     revealed no abnormalities.    The scope was then withdrawn from     the patient and the procedure completed.           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 Arlyce Dice, MD           CC:           n.     eSIGNED:   Barbette Hair. Zaeden Lastinger at 10/11/2010 12:55 PM           [name], [phone]  Note: An exclamation mark (!) indicates a result that was not dispersed into the flowsheet. Document Creation Date: 10/11/2010 1:33  PM _______________________________________________________________________  (1) Order result status: Final Collection or observation date-time: 10/11/2010 12:48 Requested date-time:  Receipt date-time:  Reported date-time:  Referring Physician:   Ordering Physician: Melvia Heaps [phone]) Specimen Source:  Source: Launa Grill Order Number: [id number] Lab site:

## 2010-10-19 NOTE — Letter (Signed)
Summary: Diabetic Instructions  [facility]  [address] [place], [place] [address]   Phone: [phone]  Fax: [phone]    Amy [name] July 26, 1956 MRN: [id number]   _  _   ORAL DIABETIC MEDICATION INSTRUCTIONS  The day before your procedure:   Take your diabetic pill as you do normally  The day of your procedure:   Do not take your diabetic pill    We will check your blood sugar levels during the admission process and again in Recovery before discharging you home  ________________________________________________________________________  _  _   INSULIN (LONG ACTING) MEDICATION INSTRUCTIONS (Lantus, NPH, 70/30, Humulin, Novolin-N)   The day before your procedure:   Take  your regular evening dose    The day of your procedure:   Do not take your morning dose    X    INSULIN (SHORT ACTING) MEDICATION INSTRUCTIONS (Regular, Humulog, Novolog)   The day before your procedure:   Do not take your evening dose   The day of your procedure:   Do not take your morning dose   _  _   INSULIN PUMP MEDICATION INSTRUCTIONS  We will contact the physician managing your diabetic care for written dosage instructions for the day before your procedure and the day of your procedure.  Once we have received the instructions, we will contact you.

## 2010-10-19 NOTE — Assessment & Plan Note (Signed)
Summary: Severe reflux    History of Present Illness Visit Type: Initial Consult Primary GI MD: Melvia Heaps MD [facility] Primary Provider: Laurann Montana, MD Requesting Provider: Laurann Montana, MD Chief Complaint: Patient states that she is having dysphagia which started about 6months ago and seems to be getting worse. She states that she was vomiting black even last night the vomiting strted about 2 weeks ago.   History of Present Illness:   Ms. Rouillard is a 55 year old African American female referred at the request of Dr. Cliffton Asters for evaluation of dysphagia.  She has a history of type 2 diabetes, hypertension, sleep apnea and bipolar disorder and is status post lap banding procedure in 2007.  She takes Nexium daily.  Over the past 6 months she has been complaining of worsening pyrosis and dysphagia solids.  She has been vomiting postprandially and claims to have vomited black material.  She denies hematemesis or melena.  She takes one Mobic daily.   GI Review of Systems    Reports acid reflux, dysphagia with liquids, dysphagia with solids, loss of appetite, and  vomiting.      Denies abdominal pain, belching, bloating, chest pain, heartburn, nausea, vomiting blood, weight loss, and  weight gain.        Denies anal fissure, black tarry stools, change in bowel habit, constipation, diarrhea, diverticulosis, fecal incontinence, heme positive stool, hemorrhoids, irritable bowel syndrome, jaundice, light color stool, liver problems, rectal bleeding, and  rectal pain. Preventive Screening-Counseling & Management  Alcohol-Tobacco     Smoking Status: never      Drug Use:  no.      Current Medications (verified): 1)  Humalog Pen 100 Unit/ml  Soln (Insulin Lispro (Human)) .... (Just Before Each Meal) 0-35-25 Units 2)  Adult Aspirin Low Strength 81 Mg  Tbdp (Aspirin) .... Take 1 By Mouth Qd 3)  Zyrtec Allergy 10 Mg  Tabs (Cetirizine Hcl) .... Take 1 By Mouth Qd 4)  Imipramine Pamoate 150 Mg  Caps  (Imipramine Pamoate) .... Take 4 By Mouth Qhs 5)  Micardis 80 Mg  Tabs (Telmisartan) .... Take 1 By Mouth Qd 6)  Vytorin 10-80 Mg  Tabs (Ezetimibe-Simvastatin) .... Take 1 By Mouth Qd 7)  Wellbutrin Sr 150 Mg  Tb12 (Bupropion Hcl) .... Takje 1 By Mouth Qd 8)  Mobic 15 Mg  Tabs (Meloxicam) .... Take1 By Mouth Qd 9)  Rhinocort Aqua 32 Mcg/act  Susp (Budesonide (Nasal)) .... Take Prn 10)  Adderall 15 Mg Tabs (Amphetamine-Dextroamphetamine) .... Take Two By Mouth Once Daily 11)  Effexor Xr 75 Mg Xr24h-Cap (Venlafaxine Hcl) .... Take Three By Mouth Once Daily 12)  Atenolol 25 Mg Tabs (Atenolol) .... Take One By Mouth Once Daily 13)  Accu-Chek Aviva  Strp (Glucose Blood) .... 3 Times A Day, and Lancets 250.01 14)  Furosemide 80 Mg Tabs (Furosemide) .... Take One By Mouth Once Daily 15)  Abilify 15 Mg Tabs (Aripiprazole) .Marland Kitchen.. 1 Tablet By Mouth Once Daily 16)  Vitamin D (Ergocalciferol) 50000 Unit Caps (Ergocalciferol) .Marland Kitchen.. 1 Tablet By Mouth Twice A Week 17)  Nexium 40 Mg Cpdr (Esomeprazole Magnesium) .... Take One By Mouth Once Daily 18)  Cpap .... Wear At Night  Allergies (verified): No Known Drug Allergies  Past History:  Past Medical History: Allergic rhinitis Depression Diabetes mellitus, type II Hypertension DM Painful periphral neuropathy Dyslipidemia OA Knee's Sleep Apnea CPAP CTS Diverticulosis Bipolar disorder Obesity Vitamin D deficiency Unspecified Anemia  Past Surgical History: Reviewed history from 03/14/2007 and no changes  required. Lap Band (2007) Edg (06/03/2004) Lower Arterial (02/03/2004) Treadmill Cardiolite (2002)  Family History: Family History of Breast Cancer: sister Family History of Prostate Cancer: brother Family History of Diabetes: brother Family History of Heart Disease: father No FH of Colon Cancer:  Social History: Occupation: maintenance  married with one son and one daighter Patient has never smoked.  Alcohol Use - no Daily Caffeine Use  3-4 per day Illicit Drug Use - no Drug Use:  no  Review of Systems       The patient complains of anxiety-new, breast changes/lumps, depression-new, fatigue, itching, muscle pains/cramps, sleeping problems, swelling of feet/legs, and thirst - excessive.  The patient denies allergy/sinus, anemia, arthritis/joint pain, back pain, blood in urine, change in vision, confusion, cough, coughing up blood, fainting, fever, headaches-new, hearing problems, heart murmur, heart rhythm changes, menstrual pain, night sweats, nosebleeds, pregnancy symptoms, shortness of breath, skin rash, sore throat, swollen lymph glands, thirst - excessive , urination - excessive , urination changes/pain, urine leakage, vision changes, and voice change.         All other systems were reviewed and were negative   Vital Signs:  Patient profile:   55 year old female Height:      65 inches Weight:      157.6 pounds BMI:     26.32 Pulse rate:   88 / minute Pulse rhythm:   regular BP sitting:   110 / 68  (left arm) Cuff size:   regular  Vitals Entered By: Harlow Mares CMA Duncan Dull) (October 10, 2010 2:37 PM)  Physical Exam  Additional Exam:  On physical exam she is a well-developed well-nourished female  skin: anicteric HEENT: normocephalic; PEERLA; no nasal or pharyngeal abnormalities neck: supple nodes: no cervical lymphadenopathy chest: clear to ausculatation and percussion heart: no murmurs, gallops, or rubs abd: soft, nontender; BS normoactive; no abdominal masses, tenderness, organomegaly; her lap band mechanism is palpated in the right.  Local area rectal: deferred ext: no cynanosis, clubbing, edema skeletal: no deformities neuro: oriented x 3; no focal abnormalities    Impression & Recommendations:  Problem # 1:  DYSPHAGIA UNSPECIFIED (ICD-787.20)  Rule out early esophageal stricture  Recommendations #1 upper endoscopy with balloon dilatation as indicated  Risks, alternatives, and complications  of the procedure, including bleeding, perforation, and possible need for surgery, were explained to the patient.  Patient's questions were answered.  Orders: ZEGD Balloon Dil (ZEGD Balloon)  Problem # 2:  SLEEP APNEA (ICD-780.57) Assessment: Comment Only  Problem # 3:  ESOPHAGEAL REFLUX (ICD-530.81) Assessment: Deteriorated  Reflux symptoms are not well controlled.  The patient takes Nexium postprandially.  She was instructed to take it before breakfast.  If she has breakthrough symptoms then I instructed her to take the second dose before dinner.  Orders: ZEGD Balloon Dil (ZEGD Balloon)  Problem # 4:  DIABETES MELLITUS, TYPE II (ICD-250.00) Assessment: Comment Only  Other Orders: TLB-CBC Platelet - w/Differential (85025-CBCD)  Patient Instructions: 1)  Copy sent to : Laurann Montana, MD 2)  Go to the basment for labs 3)  Your EGD is scheduled at [facility] Endoscopy department on 10/11/2010 at 12:30pm 4)  Conscious Sedation brochure given.  5)  Upper Endoscopy with Dilatation brochure given.  6)  The medication list was reviewed and reconciled.  All changed / newly prescribed medications were explained.  A complete medication list was provided to the patient / caregiver.

## 2010-10-19 NOTE — Procedures (Signed)
Summary: Instructions for procedure/[place]  Instructions for procedure/[place]   Imported By: Sherian Rein 10/13/2010 12:19:40  _____________________________________________________________________  External Attachment:    Type:   Image     Comment:   External Document

## 2010-10-24 ENCOUNTER — Telehealth: Payer: Self-pay | Admitting: Endocrinology

## 2010-11-03 NOTE — Progress Notes (Signed)
Summary: Rx refill req  Phone Note Refill Request Message from:  Patient on October 24, 2010 4:40 PM  Refills Requested: Medication #1:  ACCU-CHEK AVIVA  STRP 3 times a day   Dosage confirmed as above?Dosage Confirmed   Supply Requested: 3 months  Method Requested: Electronic Initial call taken by: Margaret Pyle, CMA,  October 24, 2010 4:40 PM    Prescriptions: ACCU-CHEK AVIVA  STRP (GLUCOSE BLOOD) 3 times a day, and lancets 250.01  #100 x 2   Entered by:   Margaret Pyle, CMA   Authorized by:   Minus Breeding MD   Signed by:   Margaret Pyle, CMA on 10/24/2010   Method used:   Electronically to        [facility]* (retail)       [address] [place], [place]  [address]       Ph: [phone]       Fax: [phone]   RxID:   [id number]

## 2010-11-21 ENCOUNTER — Encounter: Payer: Self-pay | Admitting: Endocrinology

## 2010-11-21 ENCOUNTER — Other Ambulatory Visit (INDEPENDENT_AMBULATORY_CARE_PROVIDER_SITE_OTHER): Payer: 59

## 2010-11-21 ENCOUNTER — Ambulatory Visit (INDEPENDENT_AMBULATORY_CARE_PROVIDER_SITE_OTHER): Payer: 59 | Admitting: Endocrinology

## 2010-11-21 VITALS — BP 132/84 | HR 83 | Temp 98.5°F | Ht 65.0 in | Wt 164.4 lb

## 2010-11-21 DIAGNOSIS — R209 Unspecified disturbances of skin sensation: Secondary | ICD-10-CM

## 2010-11-21 DIAGNOSIS — E119 Type 2 diabetes mellitus without complications: Secondary | ICD-10-CM

## 2010-11-21 LAB — VITAMIN B12: Vitamin B-12: 373 pg/mL (ref 211–911)

## 2010-11-21 MED ORDER — INSULIN LISPRO 100 UNIT/ML ~~LOC~~ SOLN: SUBCUTANEOUS | Status: DC

## 2010-11-21 NOTE — Patient Instructions (Addendum)
blood tests are being ordered for you today.  please call [phone] to hear your test results. pending the test results, please increase the humalog to (just before each meal) 0-30-40. Please return here in 3 months.   If you wish, we can also check a test of the nerve endings at a neurologist's office. To classify the cause of your symptoms. (update: i left message on phone-tree:  rx as we discussed)

## 2010-11-21 NOTE — Progress Notes (Signed)
  Subjective:    Patient ID: Amy [name], female    DOB: October 07, 1955, 55 y.o.   MRN: [id number]  HPI Pt continues to work with dr dr Arlyce Dice on her gerd sxs. She is off the lantus, but still takes the same humalog.  no cbg record, but states cbg's are 200's at hs, and in am.   Pt reports of moderate numbness of the hands and feet, and assoc pain. Past Medical History  Diagnosis Date  . DIABETES MELLITUS, TYPE II 03/14/2007  . HYPERTENSION 03/14/2007  . SLEEP APNEA 01/18/2009    CPAP  . OBESITY, MORBID 01/18/2009  . Edema 12/20/2009  . Depression   . Dyslipidemia   . DM type 2 with diabetic peripheral neuropathy     painful  . Osteoarthritis     knees  . CTS (carpal tunnel syndrome)   . Diverticulosis   . Bipolar disorder   . Vitamin D deficiency   . Anemia     unspecified   Past Surgical History  Procedure Date  . Laparoscopic gastric banding 2007  . Esophagogastroduodenoscopy 02/03/2004  . Treadmill cardiolite 2002  . Lower arterial 02/03/2004    reports that she has never smoked. She does not have any smokeless tobacco history on file. She reports that she does not drink alcohol or use illicit drugs. family history includes Cancer in her brother and sister; Diabetes in her sister; and Heart disease in her father. No Known Allergies Review of Systems Denies hypoglycemia.  She reports slight swelling of the hands    Objective:   Physical Exam Pulses: dorsalis pedis intact bilat.   Feet: no deformity.  no ulcer on the feet.  feet are of normal color and temp.  no edema Neuro: sensation is intact to touch on the feet     Lab Results  Component Value Date   WBC 7.1 10/10/2010   HGB 11.6* 10/10/2010   HCT 34.6* 10/10/2010   PLT 124.0* 10/10/2010   NA 143 12/20/2009   K 4.2 12/20/2009   CL 104 12/20/2009   CREATININE 0.9 12/20/2009   BUN 6 12/20/2009   CO2 31 12/20/2009   HGBA1C 7.8* 11/21/2010   MICROALBUR 1.5 06/07/2009   b-12 is normal   Assessment & Plan:  Dm, needs  increased rx Numbess, uncertain etiology.  New problem.

## 2010-12-30 ENCOUNTER — Telehealth: Payer: Self-pay

## 2010-12-30 NOTE — Telephone Encounter (Signed)
A user error has taken place: encounter opened in error, closed for administrative reasons.

## 2011-01-09 ENCOUNTER — Other Ambulatory Visit (HOSPITAL_COMMUNITY): Payer: Self-pay | Admitting: Family Medicine

## 2011-01-09 ENCOUNTER — Telehealth: Payer: Self-pay

## 2011-01-09 DIAGNOSIS — Z1231 Encounter for screening mammogram for malignant neoplasm of breast: Secondary | ICD-10-CM

## 2011-01-09 DIAGNOSIS — R209 Unspecified disturbances of skin sensation: Secondary | ICD-10-CM

## 2011-01-09 NOTE — Telephone Encounter (Signed)
done

## 2011-01-09 NOTE — Telephone Encounter (Signed)
Pt called requesting referral to Neurology for NGS at discussed at OV 11/21/2010

## 2011-01-10 NOTE — Consult Note (Signed)
[facility]                          ENDOCRINOLOGY CONSULTATION   Amy [name], Amy [name]                       MRN:          [id number]  DATE:04/15/2007                            DOB:          [birth year]    REASON FOR VISIT:  Follow up diabetes.   HISTORY OF PRESENT ILLNESS:  This is a 55 year old woman, who is  continuing to lose weight.  She has now lost about 70 pounds since her  lap band surgery.  She now takes Lantus 20 units q.h.s. and Humalog 10  breakfast, 10 lunch and 20 supper.  She states her glucose is well-  controlled.   She has a few diffuse myalgias and arthralgias.   PAST MEDICAL HISTORY:  1. Carpal tunnel syndrome.  2. Sleep apnea.  3. Osteoarthritis.  4. Hypertension.  5. Dyslipidemia.  6. Depression.  7. Painful peripheral neuropathy.  8. Allergic rhinitis.   REVIEW OF SYSTEMS:  Denies hypoglycemia.   PHYSICAL EXAMINATION:  Blood pressure is 126/85, heart rate is 114,  temperature is 98.2, the weight is 169.  GENERAL:  No distress.  FEET:  Normal color and temperature.  There is no ulcer present on the  feet.  Dorsalis pedis pulses are intact bilaterally and sensation is  intact to touch in the feet.   LABORATORY STUDIES:  Hemoglobin A1c 7.3.  Sedimentation 28.   IMPRESSION:  1. Insulin-requiring type 2 diabetes.  I believe her insulin      requirements will continue to decrease as she loses weight after      her surgery.  2. A few myalgias and arthralgias.   PLAN:  1. Continue to decrease her insulin according to your home glucoses.  2. Return in about three months.     Sean A. Everardo All, MD  Electronically Signed    SAE/MedQ  DD: 04/16/2007  DT: 04/17/2007  Job #: [id number]   cc:   [place]acie [place]. Cliffton Asters, M.D.  Alfonse Ras, MD

## 2011-01-13 ENCOUNTER — Other Ambulatory Visit (INDEPENDENT_AMBULATORY_CARE_PROVIDER_SITE_OTHER): Payer: Self-pay | Admitting: Surgery

## 2011-01-13 DIAGNOSIS — Z4651 Encounter for fitting and adjustment of gastric lap band: Secondary | ICD-10-CM

## 2011-01-13 NOTE — Op Note (Signed)
Amy [name], Amy [name]                ACCOUNT NO.:  [id number]   MEDICAL RECORD NO.:  [id number]          PATIENT TYPE:  AMB   LOCATION:  DAY                          FACILITY[facility]  [facility]   PHYSICIAN:  Alfonse Ras, MD   DATE OF BIRTH:  [birth year]   DATE OF PROCEDURE:  12/05/2005  DATE OF DISCHARGE:                                 OPERATIVE REPORT   PREOPERATIVE DIAGNOSIS:  Morbid obesity.   POSTOPERATIVE DIAGNOSIS:  Morbid obesity.   PROCEDURE:  Laparoscopic adjustable gastric banding with a 10 cm Innomed  band and truncal vagotomy.   SURGEON:  Alfonse Ras, M.D.   ASSISTANT:  Thornton Park. Daphine Deutscher, M.D.   ANESTHESIA:  General.   DESCRIPTION:  The patient was taken to the operating room and placed in the  supine position.  After adequate general anesthesia was induced, the abdomen  was prepped and draped in the normal sterile fashion.  Using an 11 mm  Optiview in the left upper quadrant, peritoneal access was obtained under  direct vision.  Pneumoperitoneum was obtained.  Additional 12 and 11 mm  trocars were placed throughout the abdomen.  The patient had a very large  left lateral segment of the liver.  A Nathanson liver retractor was placed  and the left lateral segment was retracted anteriorly.  The angle of His was  sharply and bluntly dissected.  I then turned my attention to the right crus  of the diaphragm, and the gastrohepatic ligament was incised.  The  retroesophageal space was then entered with Kitners and the posterior vagus  nerve was identified.  It was clipped, divided, and excised approximately 1  cm.  The anterior vagus was then located in the 11 o'clock position on the  esophagus, was dissected free, clipped and divided as well.  At this point  of the operation, the Olympus endoscope was inserted through the oropharynx  and a Congo red test was performed.  This showed red spraying of the mucosa  with no evidence of black, confirming a complete truncal  vagotomy.   I then turned my attention to the right crus of the diaphragm again and  where the crossing fat was, the LAP-BAND passer was passed in a  retroesophageal way and brought out at the angle of His.  A 10 cm band was  placed in the abdomen and passed around the proximal stomach with the band  passer.  It was snapped in place and anterior plicating sutures were  performed in a serosal to serosal manner using interrupted 2-0 Ethibond  sutures.  Satisfied with the placement of the band, the [facility] liver  retractor was removed, adequate hemostasis was assured,  trocars were removed.  The port was fixed to the anterior abdominal wall  through one of the previously-made incisions with interrupted 2-0 Prolene.  Skin incisions were closed with staples.  The patient tolerated the  procedure well, went to PACU in good condition.      Alfonse Ras, MD  Electronically Signed     KRE/MEDQ  D:  12/05/2005  T:  12/05/2005  Job:  [id number]

## 2011-01-13 NOTE — Consult Note (Signed)
[facility]                          ENDOCRINOLOGY CONSULTATION   Amy [name], Amy [name]                       MRN:          [id number]  DATE:11/15/2006                            DOB:          1956/03/21    REASON FOR VISIT:  Follow-up diabetes.   HISTORY OF PRESENT ILLNESS:  This is a 55 year old woman who had lap  band surgery last year and continues to lose weight. She has now lost 40  pounds since her surgery. She currently takes Lantus 30 units a day and  Humalog 20 breakfast, 20 lunch, 30 with supper. She states that her  glucose's are often about 200 but it is lowest before lunch when it  often below 100.   PAST MEDICAL HISTORY:  1. Carpal tunnel syndrome.  2. Sleep apnea.  3. Diffuse osteoarthritis.  4. Dyslipidemia.  5. Depression.  6. Hypertension.  7. Diabetes painful peripheral neuropathy.  8. Allergic rhinitis.   REVIEW OF SYSTEMS:  Denies hypoglycemia.   PHYSICAL EXAMINATION:  Blood pressure 109/67, heart rate 85, temperature  is 100.2. Weight is 192.   In no distress.   FEET: Normal color and temperature, there is no ulcer present on the  feet. Dorsalis pedis pulses are intact bilaterally and sensation is  intact to touch on the feet.   IMPRESSION:  Diabetes. She is making great progress with her weight  loss.   PLAN:  1. Continue Lantus at 30 units a day.  2. Decrease q.a.c. Humalog to 10 breakfast, 10 lunch and 20 supper.  3. Return in about 6 weeks.     Sean A. Everardo All, MD  Electronically Signed    SAE/MedQ  DD: 11/18/2006  DT: 11/19/2006  Job #: [id number]   cc:   Alfonse Ras, MD  [place]acie [place] Cliffton Asters, M.D.

## 2011-01-13 NOTE — Consult Note (Signed)
[facility]                            ENDOCRINOLOGY CONSULTATION   DESIRAE, MANCUSI                       MRN:          [id number]  DATE:03/12/2006                            DOB:          22-Jun-1956    REASON FOR VISIT:  Follow up diabetes.   HISTORY OF PRESENT ILLNESS:  This is a 55 year old woman who is here to  follow up for diabetes.  She has lost a few more pounds since her last  visit, on the lap-band system.  She states that on two occasions since it  was placed, the lap band has had to be tightened.  Still, the patient states  that she is disappointed with the degree of weight loss.  She states her  diabetes is well controlled except her glucose is still in the mid 100s in  the morning, and these numbers are higher than at h.s., despite not eating  any h.s. snack.   PAST MEDICAL HISTORY:  1.  Carpal tunnel syndrome.  2.  Allergic rhinitis.  3.  Sleep apnea.  4.  Osteoarthritis.  5.  Hypertension.  6.  Depression.  7.  Dyslipidemia.  8.  Diabetes, painful peripheral neuropathy.   REVIEW OF SYSTEMS:  She denies hypoglycemia.   PHYSICAL EXAMINATION:  VITAL SIGNS: Blood pressure 132/77, heart rate 85,  temperature 98.7.  Weight is 231.  GENERAL: Obese.  No distress.  FEET:  Normal color and temperature.  Dorsalis pedis pulses are intact  bilaterally.  There is no ulcer on the feet, and sensation is intact to  touch on the feet.   LABORATORY DATA:  Hemoglobin A1c 6.9.  Urine microscopic: Albumin is  negative.   IMPRESSION:  1.  Improved control of diabetes, but according to her home glucose, her      control could be improved even further.  2.  Only a modest amount of weight loss since she had the lap band placed.   PLAN:  1.  Continue followup with your bariatric surgeon to continue further      tightening of the lap band.  2.  Increase Lantus to 60 units nightly.  3.  Continue before meals or food Humalog 20 breakfast, 30  lunch, and 30      supper.  4.  Return in 3 months.  5.  I advised her that, if she should lose more weight, she may have to back      off on her insulin, and she is welcome to come back sooner, and I would      be happy to work with her on that.                                   Sean A. Everardo All, MD   SAE/MedQ  DD:  03/12/2006  DT:  03/12/2006  Job #:  [id number]   cc:   Stacie Acres. Cliffton Asters, MD

## 2011-01-17 ENCOUNTER — Other Ambulatory Visit (INDEPENDENT_AMBULATORY_CARE_PROVIDER_SITE_OTHER): Payer: Self-pay | Admitting: Surgery

## 2011-01-17 ENCOUNTER — Ambulatory Visit
Admission: RE | Admit: 2011-01-17 | Discharge: 2011-01-17 | Disposition: A | Discharge status: Home / Self Care | Payer: 59 | Source: Ambulatory Visit | Attending: Surgery | Admitting: Surgery

## 2011-01-17 DIAGNOSIS — Z4651 Encounter for fitting and adjustment of gastric lap band: Secondary | ICD-10-CM

## 2011-01-31 ENCOUNTER — Encounter (INDEPENDENT_AMBULATORY_CARE_PROVIDER_SITE_OTHER): Payer: Self-pay | Admitting: Surgery

## 2011-02-13 ENCOUNTER — Ambulatory Visit (HOSPITAL_COMMUNITY)
Admission: RE | Admit: 2011-02-13 | Discharge: 2011-02-13 | Disposition: A | Discharge status: Home / Self Care | Payer: 59 | Source: Ambulatory Visit | Attending: Family Medicine | Admitting: Family Medicine

## 2011-02-13 DIAGNOSIS — Z1231 Encounter for screening mammogram for malignant neoplasm of breast: Secondary | ICD-10-CM | POA: Insufficient documentation

## 2011-02-20 ENCOUNTER — Encounter: Payer: Self-pay | Admitting: Endocrinology

## 2011-02-20 ENCOUNTER — Ambulatory Visit (INDEPENDENT_AMBULATORY_CARE_PROVIDER_SITE_OTHER): Payer: 59 | Admitting: Endocrinology

## 2011-02-20 ENCOUNTER — Other Ambulatory Visit (INDEPENDENT_AMBULATORY_CARE_PROVIDER_SITE_OTHER): Payer: 59

## 2011-02-20 VITALS — BP 122/80 | HR 77 | Temp 98.5°F | Ht 65.0 in | Wt 195.2 lb

## 2011-02-20 DIAGNOSIS — E119 Type 2 diabetes mellitus without complications: Secondary | ICD-10-CM

## 2011-02-20 LAB — MICROALBUMIN / CREATININE URINE RATIO
Creatinine,U: 102.2 mg/dL
Microalb Creat Ratio: 1.1 mg/g (ref 0.0–30.0)

## 2011-02-20 MED ORDER — INSULIN GLARGINE 100 UNIT/ML ~~LOC~~ SOLN: 10.0000 [IU] | Freq: Every day | SUBCUTANEOUS | Status: DC

## 2011-02-20 MED ORDER — GLUCOSE BLOOD VI STRP: ORAL_STRIP | Status: DC

## 2011-02-20 NOTE — Progress Notes (Signed)
Subjective:    Patient ID: Amy [name], female    DOB: [birth year], 55 y.o.   MRN: [id number]  HPI Pt returns for f/u insulin-requiring type 2 dm.  She is discouraged about weight gain.  Her "lap-band" was loosened 2 weeks ago, and has since been re-tightened.  Pt says her physical activity is good.  no cbg record, but states cbg's are highest in am (300).  She says this is higher than at hs, even if she does not eat a bedtime-snack.   Past Medical History  Diagnosis Date  . DIABETES MELLITUS, TYPE II 03/14/2007  . HYPERTENSION 03/14/2007  . SLEEP APNEA 01/18/2009    CPAP  . OBESITY, MORBID 01/18/2009  . Edema 12/20/2009  . Depression   . Dyslipidemia   . DM type 2 with diabetic peripheral neuropathy     painful  . Osteoarthritis     knees  . CTS (carpal tunnel syndrome)   . Diverticulosis   . Bipolar disorder   . Vitamin D deficiency   . Anemia     unspecified    Past Surgical History  Procedure Date  . Laparoscopic gastric banding 2007  . Esophagogastroduodenoscopy 02/03/2004  . Treadmill cardiolite 2002  . Lower arterial 02/03/2004    History   Social History  . Marital Status: Married    Spouse Name: N/A    Number of Children: 2  . Years of Education: N/A   Occupational History  . maintenance [employer]   Social History Main Topics  . Smoking status: Never Smoker   . Smokeless tobacco: Not on file  . Alcohol Use: No  . Drug Use: No  . Sexually Active:    Other Topics Concern  . Not on file   Social History Narrative   Married with one son and one daughterDaily Caffeine use: 3-4 per day    Current Outpatient Prescriptions on File Prior to Visit  Medication Sig Dispense Refill  . amphetamine-dextroamphetamine (ADDERALL) 15 MG tablet Take 2 tablets by mouth once daily       . ARIPiprazole (ABILIFY) 15 MG tablet Take 15 mg by mouth daily.        Marland Kitchen aspirin 81 MG tablet Take 81 mg by mouth daily.        Marland Kitchen atenolol (TENORMIN) 25 MG tablet Take 25 mg by  mouth daily.        . budesonide (RHINOCORT AQUA) 32 MCG/ACT nasal spray Use as needed       . cetirizine (ZYRTEC) 10 MG tablet Take 10 mg by mouth daily.        Marland Kitchen desvenlafaxine (PRISTIQ) 50 MG 24 hr tablet Take 50 mg by mouth 2 (two) times daily. (100mg  daily)       . ergocalciferol (VITAMIN D2) 50000 UNITS capsule Take 50,000 Units by mouth once a week.        . esomeprazole (NEXIUM) 40 MG capsule Take 40 mg by mouth daily before breakfast.        . ezetimibe-simvastatin (VYTORIN) 10-80 MG per tablet Take 1 tablet by mouth at bedtime.        . furosemide (LASIX) 80 MG tablet Take 80 mg by mouth daily.        Marland Kitchen glucose blood (ACCU-CHEK AVIVA) test strip 3 times a day dx 250.01       . imipramine (TOFRANIL-PM) 150 MG capsule Take 4 by mouth at bedtime       . Lancets (ACCU-CHEK SOFT TOUCH) lancets  Use as instructed dx 250.01       . meloxicam (MOBIC) 15 MG tablet Take 15 mg by mouth daily.        . NON FORMULARY CPAP  Wear at night       . telmisartan (MICARDIS) 80 MG tablet Take 80 mg by mouth daily.        Marland Kitchen DISCONTD: insulin lispro (HUMALOG PEN) 100 UNIT/ML injection 0-30-40 units  30 mL  11  . DISCONTD: buPROPion (WELLBUTRIN SR) 150 MG 12 hr tablet Take 150 mg by mouth daily.        Marland Kitchen DISCONTD: venlafaxine (EFFEXOR-XR) 75 MG 24 hr capsule Take 3 by mouth once daily         No Known Allergies  Family History  Problem Relation Age of Onset  . Heart disease Father   . Cancer Sister     Breast Cancer  . Diabetes Sister   . Cancer Brother     Prostate Cancer (no Family History of Colon Cancer)    BP 122/80  Pulse 77  Temp(Src) 98.5 F (36.9 C) (Oral)  Ht 5\' 5"  (1.651 m)  Wt 195 lb 3.2 oz (88.542 kg)  BMI 32.48 kg/m2  SpO2 95%  Review of Systems denies hypoglycemia.    Objective:   Physical Exam Pulses: dorsalis pedis intact bilat.   Feet: no deformity.  no ulcer on the feet.  feet are of normal color and temp.  no edema Neuro: sensation is intact to touch on the feet,  but decreased from normal.    Lab Results  Component Value Date   HGBA1C 8.6* 02/20/2011   Assessment & Plan:  Dm, needs increased rx Weight gain, recurrent after bariatric surgery.  She is still better off having had the surgery.

## 2011-02-20 NOTE — Patient Instructions (Addendum)
blood tests are being ordered for you today.  please call [phone] to hear your test results.  You will be prompted to enter the 9-digit "MRN" number that appears at the top left of this page, followed by #.  Then you will hear the message. pending the test results, please continue the same humalog.   Add lantus 10 units at bedtime.    check your blood sugar 3 times a day.  vary the time of day when you check, between before the 3 meals, and at bedtime.  also check if you have symptoms of your blood sugar being too high or too low.  please keep a record of the readings and bring it to your next appointment here.  please call us sooner if you are having low blood sugar episodes.good diet and exercise habits significanly improve the control of your diabetes.  please let me know if you wish to be referred to a dietician.  high blood sugar is very risky to your health.  you should see an eye doctor every year. controlling your blood pressure and cholesterol drastically reduces the damage diabetes does to your body.  this also applies to quitting smoking.  please discuss these with your doctor.  you should take an aspirin every day, unless you have been advised by a doctor not to. Please make a follow-up appointment in 6 weeks.  (update: i left message on phone-tree:  rx as we discussed)

## 2011-03-16 ENCOUNTER — Ambulatory Visit (INDEPENDENT_AMBULATORY_CARE_PROVIDER_SITE_OTHER): Payer: 59 | Admitting: Surgery

## 2011-03-16 VITALS — BP 132/86 | HR 92 | Temp 96.9°F

## 2011-03-16 DIAGNOSIS — R1310 Dysphagia, unspecified: Secondary | ICD-10-CM

## 2011-03-16 DIAGNOSIS — Z9889 Other specified postprocedural states: Secondary | ICD-10-CM

## 2011-03-16 DIAGNOSIS — Z9884 Bariatric surgery status: Secondary | ICD-10-CM

## 2011-03-16 DIAGNOSIS — Z4651 Encounter for fitting and adjustment of gastric lap band: Secondary | ICD-10-CM

## 2011-03-16 NOTE — Progress Notes (Signed)
Any Amy [name] comes in today requesting more fluid. Today's weight is 192.6 and she felt she is needed more. She status post and vagotomy April 2007. I went ahead and added a total of 3.2 cc to her 10 cm band. Will see her back in 2 months.

## 2011-03-16 NOTE — Patient Instructions (Signed)
Thin liquids 1-2 ozs at a time tonight  Full liquids as tolerated x 2 days  THEN, small volumes of regular food as tolerated.  If things sticking, go back to warm liquids 1oz/hour max for 12-24hours.  Call if worse

## 2011-03-16 NOTE — Progress Notes (Signed)
Subjective:     Patient ID: Amy [name], female   DOB: 1955-09-07, 55 y.o.   MRN: [id number]  HPI  The patient had a LAP-BAND filled earlier today. She developed chest pain after drinking 16 ounces of water. She's having difficulty breathing. She called. She was sent back to [place] to see if the band needed to be emptied.  For the past few hours, she's been feeling fine. No difficulty breathing. She drinks a little bit of liquid prior to seeing me and tolerated small sips well. No difficulty walking. No pain in her arm or jaw. No diaphoresis or sweating. No colds coughs or wheezing  Review of Systems  Constitutional: Negative for fever, chills, diaphoresis, appetite change and fatigue.  HENT: Negative for nosebleeds, sore throat, mouth sores, neck pain and neck stiffness.   Eyes: Negative for photophobia, discharge and visual disturbance.  Respiratory: Negative for cough, choking, chest tightness, shortness of breath and wheezing.   Cardiovascular: Negative for chest pain, palpitations and leg swelling.  Gastrointestinal: Negative for nausea, vomiting, abdominal pain, diarrhea, constipation, blood in stool, abdominal distention, anal bleeding and rectal pain.  Genitourinary: Negative for dysuria, frequency, flank pain, vaginal bleeding, vaginal discharge and difficulty urinating.  Musculoskeletal: Negative for back pain, arthralgias and gait problem.  Skin: Negative for color change, pallor and rash.  Neurological: Negative for dizziness, speech difficulty, weakness and numbness.  Hematological: Negative for adenopathy. Does not bruise/bleed easily.  Psychiatric/Behavioral: Negative for confusion and agitation. The patient is not nervous/anxious.          Objective:   Physical Exam  Constitutional: She is oriented to person, place, and time. She appears well-developed and well-nourished. No distress.  HENT:  Head: Normocephalic.  Mouth/Throat: Oropharynx is clear and moist. No  oropharyngeal exudate.  Eyes: Conjunctivae and EOM are normal. Pupils are equal, round, and reactive to light. No scleral icterus.  Neck: Normal range of motion. Neck supple. No tracheal deviation present.  Cardiovascular: Normal rate, regular rhythm and intact distal pulses.   Pulmonary/Chest: Effort normal and breath sounds normal. No respiratory distress. She exhibits no tenderness.  Abdominal: Soft. She exhibits no distension and no mass. There is no tenderness. Hernia confirmed negative in the right inguinal area and confirmed negative in the left inguinal area.       LapBand site R mid abdomen clean.  No hematoma  Musculoskeletal: Normal range of motion. She exhibits no edema and no tenderness.  Lymphadenopathy:    She has no cervical adenopathy.       Right: No inguinal adenopathy present.       Left: No inguinal adenopathy present.  Neurological: She is alert and oriented to person, place, and time. No cranial nerve deficit. She exhibits normal muscle tone. Coordination normal.  Skin: Skin is warm and dry. No rash noted. She is not diaphoretic. No erythema.  Psychiatric: She has a normal mood and affect. Her behavior is normal. Judgment and thought content normal.       Assessment:     Episode of probable dysphasia. Resolved.    Plan:     She was able to drink a cup of water in 5 minutes without difficulty. No dysphagia. No nausea vomiting. No pain. She looks fine.  Because she feels better, I do not think we should remove any fluid at this time if she does not have any definite obstructive symptoms.  I recommended that she start with the small volumes of liquids tonight and gradually increased to  thickr liquids for a few days. Wait on any solid foods for several days. If she spits up or gets dysphagia again, switch to just 1 ounce an hour warm liquids. If that fails, then we need to see her to release fluid from the LAP-BAND. She agrees with this plan.

## 2011-04-03 ENCOUNTER — Ambulatory Visit: Payer: 59 | Admitting: Endocrinology

## 2011-04-21 ENCOUNTER — Ambulatory Visit: Payer: 59 | Admitting: Endocrinology

## 2011-06-02 ENCOUNTER — Ambulatory Visit (INDEPENDENT_AMBULATORY_CARE_PROVIDER_SITE_OTHER): Payer: 59 | Admitting: Physician Assistant

## 2011-06-02 ENCOUNTER — Encounter (INDEPENDENT_AMBULATORY_CARE_PROVIDER_SITE_OTHER): Payer: Self-pay

## 2011-06-02 VITALS — BP 136/84 | HR 76 | Resp 16 | Ht 65.0 in | Wt 201.8 lb

## 2011-06-02 DIAGNOSIS — Z4651 Encounter for fitting and adjustment of gastric lap band: Secondary | ICD-10-CM

## 2011-06-02 NOTE — Patient Instructions (Signed)
Take clear liquids for the next 48 hours. Thin protein shakes are ok to start on Saturday evening. Call us if you have persistent vomiting or regurgitation, night cough or reflux symptoms. Return as scheduled or sooner if you notice no changes in hunger/portion sizes.   

## 2011-06-02 NOTE — Progress Notes (Signed)
  HISTORY: Amy [name] is a 55 y.o.female who received an 10cm lap-band in April 2007 by Dr. Colin Benton. She comes in having last been seen in July and has gained 9 lbs. She says she's hungry and capable of eating bigger portions than she has in the past. She denies vomiting or regurgitation. During her last visit, she had 3.2 mL added to her band and had some dysphagia that self-resolved without removal of any fluid. She said her blood sugars have been very difficult to manage as well, with her evening AC FSBS being 340 last night.  VITAL SIGNS: Filed Vitals:   06/02/11 0909  BP: 136/84  Pulse: 76  Resp: 16    PHYSICAL EXAM: Physical exam reveals a very well-appearing 55 y.o.female in no apparent distress Neurologic: Awake, alert, oriented Psych: Bright affect, conversant Respiratory: Breathing even and unlabored. No stridor or wheezing Abdomen: Soft, nontender, nondistended to palpation. Incisions well-healed. No incisional hernias. Port easily palpated. Extremities: Atraumatic, good range of motion.  ASSESMENT: 55 y.o.  female  s/p 10cm lap-band.   PLAN: The patient's port was accessed with a 20G Huber needle without difficulty. Five mL of clear fluid was aspirated and 0.2 mL saline was added to the port to give a total predicted volume of 5.2 mL. This is significantly more than anticipated. The patient was able to swallow water without difficulty following the procedure and was instructed to take clear liquids for the next 24-48 hours and advance slowly as tolerated.

## 2011-06-13 ENCOUNTER — Encounter (INDEPENDENT_AMBULATORY_CARE_PROVIDER_SITE_OTHER): Payer: Self-pay | Admitting: Surgery

## 2011-07-19 ENCOUNTER — Encounter (INDEPENDENT_AMBULATORY_CARE_PROVIDER_SITE_OTHER): Payer: 59 | Admitting: Surgery

## 2011-07-19 ENCOUNTER — Encounter (INDEPENDENT_AMBULATORY_CARE_PROVIDER_SITE_OTHER): Payer: 59

## 2011-08-04 ENCOUNTER — Encounter (INDEPENDENT_AMBULATORY_CARE_PROVIDER_SITE_OTHER): Payer: Self-pay | Admitting: Surgery

## 2011-08-04 ENCOUNTER — Ambulatory Visit (INDEPENDENT_AMBULATORY_CARE_PROVIDER_SITE_OTHER): Payer: 59 | Admitting: Surgery

## 2011-08-04 ENCOUNTER — Encounter (INDEPENDENT_AMBULATORY_CARE_PROVIDER_SITE_OTHER): Payer: 59 | Admitting: Surgery

## 2011-08-04 DIAGNOSIS — Z4651 Encounter for fitting and adjustment of gastric lap band: Secondary | ICD-10-CM

## 2011-08-04 DIAGNOSIS — Z9884 Bariatric surgery status: Secondary | ICD-10-CM

## 2011-08-04 NOTE — Patient Instructions (Signed)

## 2011-08-04 NOTE — Progress Notes (Signed)
Amy [name] 55 y.o.  Body mass index is 32.99 kg/(m^2).  Patient Active Problem List  Diagnoses  . DIABETES MELLITUS, TYPE II  . OBESITY, MORBID  . DEPRESSION  . HYPERTENSION  . ALLERGIC RHINITIS  . SLEEP APNEA  . NUMBNESS  . EDEMA  . ESOPHAGEAL REFLUX  . HEMATEMESIS  . DYSPHAGIA UNSPECIFIED  . Lapband Vagotomy Study Pt May 2007 with 10 cm lapband    No Known Allergies  Past Surgical History  Procedure Date  . Laparoscopic gastric banding 2007  . Esophagogastroduodenoscopy 02/03/2004  . Treadmill cardiolite 2002  . Lower arterial 02/03/2004   Amy Bradford, MD 1. Lapband Vagotomy Study Pt May 2007 with 10 cm lapband     Amy [name] comes in today and await is 198.4. She felt like she needed a little bit more restriction. She's not hungry and she is not having any vomiting or reflux. When it and dated 0.25 cc to her lapband. I counseled her on avoidance of carbs in her diet. She has not had any complications from her vagotomy. She does still take medications for diabetes and is followed by Dr. Laurann Montana. For that.  Plan return in 2 months Matt B. Daphine Deutscher, MD, [facility] [phone] beeper [phone]  08/04/2011 10:09 AM

## 2011-08-24 ENCOUNTER — Ambulatory Visit (INDEPENDENT_AMBULATORY_CARE_PROVIDER_SITE_OTHER): Payer: 59 | Admitting: Surgery

## 2011-08-24 DIAGNOSIS — K9509 Other complications of gastric band procedure: Secondary | ICD-10-CM

## 2011-08-24 DIAGNOSIS — Z4651 Encounter for fitting and adjustment of gastric lap band: Secondary | ICD-10-CM

## 2011-08-24 NOTE — Patient Instructions (Signed)

## 2011-08-24 NOTE — Progress Notes (Signed)
Amy [name] There is no height or weight on file to calculate BMI.  Having regurgitation:  Yes.  Cannot keep anything down since Christmas meal.  Doesn't want much out if possible  Nocturnal reflux?  no  Amount of fill  -0.2 Able to drink water ok.  See [place] back in Feb at reg scheduled appt

## 2011-10-05 ENCOUNTER — Telehealth (INDEPENDENT_AMBULATORY_CARE_PROVIDER_SITE_OTHER): Payer: Self-pay | Admitting: Surgery

## 2011-11-10 ENCOUNTER — Ambulatory Visit (INDEPENDENT_AMBULATORY_CARE_PROVIDER_SITE_OTHER): Payer: 59 | Admitting: Surgery

## 2011-11-10 ENCOUNTER — Encounter (INDEPENDENT_AMBULATORY_CARE_PROVIDER_SITE_OTHER): Payer: Self-pay | Admitting: Surgery

## 2011-11-10 VITALS — BP 110/72 | HR 88 | Temp 99.2°F | Resp 16 | Ht 65.0 in | Wt 195.8 lb

## 2011-11-10 DIAGNOSIS — Z4651 Encounter for fitting and adjustment of gastric lap band: Secondary | ICD-10-CM

## 2011-11-10 DIAGNOSIS — Z9884 Bariatric surgery status: Secondary | ICD-10-CM

## 2011-11-10 NOTE — Progress Notes (Signed)
[name] Body mass index is 32.58 kg/(m^2).  Having regurgitation:  no  Nocturnal reflux?  no  Amount of fill  +0.2 Amy [name] has gained a little since last visit 12/12.  Needed a little more back in band.   Return 3 months

## 2011-11-17 ENCOUNTER — Other Ambulatory Visit (HOSPITAL_COMMUNITY): Payer: Self-pay | Admitting: Family Medicine

## 2011-11-17 DIAGNOSIS — Z1231 Encounter for screening mammogram for malignant neoplasm of breast: Secondary | ICD-10-CM

## 2011-11-20 ENCOUNTER — Telehealth (INDEPENDENT_AMBULATORY_CARE_PROVIDER_SITE_OTHER): Payer: Self-pay | Admitting: General Surgery

## 2011-11-20 ENCOUNTER — Encounter (INDEPENDENT_AMBULATORY_CARE_PROVIDER_SITE_OTHER): Payer: 59 | Admitting: Surgery

## 2011-11-20 NOTE — Telephone Encounter (Signed)
Amy [name] contacted the office this AM stating she is having a hard time swallowing since her fill last week, explained to her that she will need to be seen in urgent clinic today and that it is possible all of her fluid will be removed since the urgent clinic doctor is not a Bariatric physician. She was not happy regarding this but understood.

## 2011-12-14 ENCOUNTER — Encounter (INDEPENDENT_AMBULATORY_CARE_PROVIDER_SITE_OTHER): Payer: Self-pay

## 2011-12-14 ENCOUNTER — Ambulatory Visit (INDEPENDENT_AMBULATORY_CARE_PROVIDER_SITE_OTHER): Payer: 59 | Admitting: Physician Assistant

## 2011-12-14 VITALS — BP 112/76 | Ht 65.0 in | Wt 182.2 lb

## 2011-12-14 DIAGNOSIS — Z4651 Encounter for fitting and adjustment of gastric lap band: Secondary | ICD-10-CM

## 2011-12-14 NOTE — Patient Instructions (Signed)
Return to see Dr. Daphine Deutscher as scheduled. Return next week if you still have reflux and difficulty swallowing solids.

## 2011-12-14 NOTE — Progress Notes (Addendum)
  HISTORY: Amy [name] is a 56 y.o.female who received an 10cm lap-band in April 2007 by Dr. Daphine Deutscher. She comes in today with persistent reflux over the past few weeks. She has been taken off her nexium and placed on another PPI as her insurance company did not cover the cost. She says this too has made her reflux worse. She says that when she has restriction, she has reflux. When she doesn't have reflux, she gains weight.  VITAL SIGNS: Filed Vitals:   12/14/11 1147  BP: 112/76    PHYSICAL EXAM: Physical exam reveals a very well-appearing 56 y.o.female in no apparent distress Neurologic: Awake, alert, oriented Psych: Bright affect, conversant Respiratory: Breathing even and unlabored. No stridor or wheezing Abdomen: Soft, nontender, nondistended to palpation. Incisions well-healed. No incisional hernias. Port easily palpated. Extremities: Atraumatic, good range of motion.  ASSESMENT: 56 y.o.  female  s/p 10cm lap-band.   PLAN: The patient's port was accessed with a 20G Huber needle without difficulty. Clear fluid was aspirated and 0.1 mL saline was removed from her band. If this still doesn't help, I'll have her come back next week. Otherwise she'll keep her next appointment with Dr. Daphine Deutscher.  If symptoms persist, pt will need an UGI  Mary Sella. Andrey Campanile, MD, FACS General, Bariatric, & Minimally Invasive Surgery [facility], [place]

## 2011-12-27 ENCOUNTER — Encounter: Payer: Self-pay | Admitting: Gastroenterology

## 2011-12-27 ENCOUNTER — Ambulatory Visit (INDEPENDENT_AMBULATORY_CARE_PROVIDER_SITE_OTHER): Payer: 59 | Admitting: Gastroenterology

## 2011-12-27 VITALS — BP 116/70 | HR 80 | Ht 65.0 in | Wt 186.0 lb

## 2011-12-27 DIAGNOSIS — R1310 Dysphagia, unspecified: Secondary | ICD-10-CM

## 2011-12-27 DIAGNOSIS — K219 Gastro-esophageal reflux disease without esophagitis: Secondary | ICD-10-CM

## 2011-12-27 NOTE — Patient Instructions (Signed)
Follow up as needed

## 2011-12-27 NOTE — Progress Notes (Signed)
History of Present Illness:  Ms. Arvilla Market has returned for followup of GERD. She has GERD which is well controlled with Nexium. About a year ago she was complaining of dysphagia that was felt to secondary to the lap band that was too tight. The band was loosened and she no longer is symptomatic.    Review of Systems: Pertinent positive and negative review of systems were noted in the above HPI section. All other review of systems were otherwise negative.    Current Medications, Allergies, Past Medical History, Past Surgical History, Family History and Social History were reviewed in [employer] electronic medical record  Vital signs were reviewed in today's medical record. Vital signs from this visit reviewed General Well developed, well nourished  Skin No rashes; anicteric HEENT No pharyngeal abnormalities Neck No masses, thyroidomegaly Chest Clear to auscultation and percussion Cardiac No murmurs, gallops, rubs Abdomen BS active; no abdominal masses, tenderness, organomegaly Rectal deferred Extremeties No cyanosis, clubbing, edema Neuro Alert, oriented x 3; no focal abnormalities

## 2011-12-27 NOTE — Assessment & Plan Note (Signed)
Well controlled with Nexium. Plan to continue with the same.

## 2011-12-27 NOTE — Assessment & Plan Note (Addendum)
Resolved following loosening of  gastric band

## 2012-02-08 ENCOUNTER — Encounter (INDEPENDENT_AMBULATORY_CARE_PROVIDER_SITE_OTHER): Payer: Self-pay | Admitting: Surgery

## 2012-02-08 ENCOUNTER — Encounter (INDEPENDENT_AMBULATORY_CARE_PROVIDER_SITE_OTHER): Payer: 59 | Admitting: Surgery

## 2012-02-08 ENCOUNTER — Ambulatory Visit (INDEPENDENT_AMBULATORY_CARE_PROVIDER_SITE_OTHER): Payer: 59 | Admitting: Surgery

## 2012-02-08 VITALS — BP 108/78 | Ht 65.0 in | Wt 192.2 lb

## 2012-02-08 DIAGNOSIS — Z9884 Bariatric surgery status: Secondary | ICD-10-CM

## 2012-02-08 DIAGNOSIS — Z4651 Encounter for fitting and adjustment of gastric lap band: Secondary | ICD-10-CM

## 2012-02-08 NOTE — Patient Instructions (Addendum)

## 2012-02-08 NOTE — Progress Notes (Signed)
Amy [name] Body mass index is 31.99 kg/(m^2).  Having regurgitation:  no  Nocturnal reflux?  no  Amount of fill  +0.2  Amy [name] reports some occasional stomach rales. She denies any bloating or diarrhea. Since she had attempt to CC taken operative and she's gained 10 pounds. She asked me about a sleeve and we discussed that briefly. I went ahead and added 0.2 cc Dr. 10 cm band. I will see her again in 6 weeks and assess things. She has essentially no complications from her vagotomy.

## 2012-02-14 ENCOUNTER — Ambulatory Visit (HOSPITAL_COMMUNITY)
Admission: RE | Admit: 2012-02-14 | Discharge: 2012-02-14 | Disposition: A | Discharge status: Home / Self Care | Payer: 59 | Source: Ambulatory Visit | Attending: Family Medicine | Admitting: Family Medicine

## 2012-02-14 DIAGNOSIS — Z1231 Encounter for screening mammogram for malignant neoplasm of breast: Secondary | ICD-10-CM

## 2012-04-17 ENCOUNTER — Ambulatory Visit (INDEPENDENT_AMBULATORY_CARE_PROVIDER_SITE_OTHER): Payer: 59 | Admitting: Surgery

## 2012-04-17 ENCOUNTER — Encounter (INDEPENDENT_AMBULATORY_CARE_PROVIDER_SITE_OTHER): Payer: Self-pay | Admitting: Surgery

## 2012-04-17 VITALS — BP 122/74 | HR 96 | Temp 98.0°F | Resp 16 | Ht 65.0 in | Wt 182.8 lb

## 2012-04-17 DIAGNOSIS — Z9889 Other specified postprocedural states: Secondary | ICD-10-CM

## 2012-04-17 NOTE — Progress Notes (Signed)
[name] Body mass index is 30.42 kg/(m^2).  Having regurgitation:  no  Nocturnal reflux?  no  Amount of fill  None [place] has lost down to 182.  No complications from vagotomy.  Doing well Return 3 months

## 2012-04-17 NOTE — Patient Instructions (Signed)
Keep the carbs low in your diet

## 2012-08-08 ENCOUNTER — Encounter (INDEPENDENT_AMBULATORY_CARE_PROVIDER_SITE_OTHER): Payer: 59 | Admitting: Surgery

## 2013-01-06 ENCOUNTER — Other Ambulatory Visit (HOSPITAL_COMMUNITY): Payer: Self-pay | Admitting: Family Medicine

## 2013-01-06 DIAGNOSIS — Z1231 Encounter for screening mammogram for malignant neoplasm of breast: Secondary | ICD-10-CM

## 2013-02-17 ENCOUNTER — Ambulatory Visit (HOSPITAL_COMMUNITY)
Admission: RE | Admit: 2013-02-17 | Discharge: 2013-02-17 | Disposition: A | Discharge status: Home / Self Care | Payer: PRIVATE HEALTH INSURANCE | Source: Ambulatory Visit | Attending: Family Medicine | Admitting: Family Medicine

## 2013-02-17 DIAGNOSIS — Z1231 Encounter for screening mammogram for malignant neoplasm of breast: Secondary | ICD-10-CM | POA: Insufficient documentation

## 2013-03-05 ENCOUNTER — Other Ambulatory Visit: Payer: Self-pay | Admitting: *Deleted

## 2013-03-05 NOTE — Telephone Encounter (Signed)
Left message for patient to call back, need to find out where she is getting her diabetic supplies from

## 2013-03-06 ENCOUNTER — Other Ambulatory Visit: Payer: Self-pay | Admitting: *Deleted

## 2013-03-06 MED ORDER — GLUCOSE BLOOD VI STRP: ORAL_STRIP | Status: DC

## 2013-03-06 MED ORDER — INSULIN GLARGINE 100 UNIT/ML ~~LOC~~ SOLN: 26.0000 [IU] | Freq: Every day | SUBCUTANEOUS | Status: DC

## 2013-03-13 ENCOUNTER — Encounter (INDEPENDENT_AMBULATORY_CARE_PROVIDER_SITE_OTHER): Payer: Self-pay

## 2013-03-13 ENCOUNTER — Ambulatory Visit (INDEPENDENT_AMBULATORY_CARE_PROVIDER_SITE_OTHER): Payer: 59 | Admitting: Physician Assistant

## 2013-03-13 DIAGNOSIS — Z4651 Encounter for fitting and adjustment of gastric lap band: Secondary | ICD-10-CM

## 2013-03-13 NOTE — Patient Instructions (Signed)

## 2013-03-13 NOTE — Progress Notes (Signed)
  HISTORY: Amy [name] is a 57 y.o.female who received an 10cm lap-band in April 2007 by Dr. Daphine Deutscher. She was last seen 11 months ago by Dr. Daphine Deutscher. She did not have a fill at that point. Since then she's gained only 2 lbs but she describes being more hungry and eating larger portions. She denies regurgitation or reflux. She is continuing to take nexium with good results. She also reports improvement in her glycemic control, which was a big problem for her last year.  VITAL SIGNS: Filed Vitals:   03/13/13 1100  BP: 122/76  Pulse: 74  Resp: 14    PHYSICAL EXAM: Physical exam reveals a very well-appearing 57 y.o.female in no apparent distress Neurologic: Awake, alert, oriented Psych: Bright affect, conversant Respiratory: Breathing even and unlabored. No stridor or wheezing Abdomen: Soft, nontender, nondistended to palpation. Incisions well-healed. No incisional hernias. Port easily palpated. Extremities: Atraumatic, good range of motion.  ASSESMENT: 57 y.o.  female  s/p 10cm lap-band.   PLAN: The patient's port was accessed with a 20G Huber needle without difficulty. Clear fluid was aspirated and 0.15 mL saline was added to the port. The patient was able to swallow water without difficulty following the procedure and was instructed to take clear liquids for the next 24-48 hours and advance slowly as tolerated.

## 2013-03-18 ENCOUNTER — Other Ambulatory Visit: Payer: Self-pay | Admitting: *Deleted

## 2013-03-18 MED ORDER — INSULIN GLARGINE 100 UNIT/ML ~~LOC~~ SOLN: 26.0000 [IU] | Freq: Every day | SUBCUTANEOUS | Status: DC

## 2013-03-18 MED ORDER — GLUCOSE BLOOD VI STRP: ORAL_STRIP | Status: DC

## 2013-04-08 ENCOUNTER — Ambulatory Visit (INDEPENDENT_AMBULATORY_CARE_PROVIDER_SITE_OTHER): Payer: 59 | Admitting: Surgery

## 2013-04-08 ENCOUNTER — Encounter (INDEPENDENT_AMBULATORY_CARE_PROVIDER_SITE_OTHER): Payer: Self-pay | Admitting: Surgery

## 2013-04-08 VITALS — BP 100/62 | HR 78 | Temp 97.6°F | Resp 18 | Ht 65.0 in | Wt 175.0 lb

## 2013-04-08 DIAGNOSIS — Z9884 Bariatric surgery status: Secondary | ICD-10-CM

## 2013-04-08 DIAGNOSIS — Z4651 Encounter for fitting and adjustment of gastric lap band: Secondary | ICD-10-CM

## 2013-04-08 NOTE — Progress Notes (Signed)
Lapband Fill Encounter Problem List:   Patient Active Problem List   Diagnosis Date Noted  . Lapband Vagotomy Study Pt May 2007 with 10 cm lapband 08/04/2011    Priority: High  . ESOPHAGEAL REFLUX 10/10/2010  . HEMATEMESIS 10/10/2010  . DYSPHAGIA UNSPECIFIED 10/10/2010  . EDEMA 12/20/2009  . OBESITY, MORBID 01/18/2009  . SLEEP APNEA 01/18/2009  . NUMBNESS 04/20/2008  . DIABETES MELLITUS, TYPE II 03/14/2007  . DEPRESSION 03/14/2007  . HYPERTENSION 03/14/2007  . ALLERGIC RHINITIS 03/14/2007    [name] Body mass index is 29.12 kg/(m^2). Weight loss since surgery  67 pounds  Having regurgitation?:  no  Feel that they need a fill?  yes  Nocturnal reflux?  no  Amount of fill  0.1 cc     Instructions given and weight loss goals discussed.    Feliz  comes in today and asked a number of questions which were discussed. Since her vagotomy she doesn't perceive hunger when she should she says she's didn't think she's is restricted. She has lost 10 pounds since her last visit with Mardelle Matte  in July. I went ahead and put in  0.1 cc to her band.  We discussed what she shouldn't eat and I encouraged her to drink more water. She will be followed up in our office in 2 months.  Matt B. Daphine Deutscher, MD, FACS

## 2013-04-08 NOTE — Patient Instructions (Signed)

## 2013-04-09 ENCOUNTER — Observation Stay (HOSPITAL_COMMUNITY)
Admission: EM | Admit: 2013-04-09 | Discharge: 2013-04-10 | Disposition: A | Discharge status: Home / Self Care | Payer: 59 | Attending: Internal Medicine | Admitting: Internal Medicine

## 2013-04-09 ENCOUNTER — Emergency Department (HOSPITAL_COMMUNITY): Payer: 59

## 2013-04-09 DIAGNOSIS — R209 Unspecified disturbances of skin sensation: Secondary | ICD-10-CM

## 2013-04-09 DIAGNOSIS — R4182 Altered mental status, unspecified: Secondary | ICD-10-CM

## 2013-04-09 DIAGNOSIS — R1310 Dysphagia, unspecified: Secondary | ICD-10-CM

## 2013-04-09 DIAGNOSIS — R609 Edema, unspecified: Secondary | ICD-10-CM

## 2013-04-09 DIAGNOSIS — R079 Chest pain, unspecified: Secondary | ICD-10-CM

## 2013-04-09 DIAGNOSIS — G459 Transient cerebral ischemic attack, unspecified: Principal | ICD-10-CM | POA: Diagnosis present

## 2013-04-09 DIAGNOSIS — J309 Allergic rhinitis, unspecified: Secondary | ICD-10-CM

## 2013-04-09 DIAGNOSIS — F3289 Other specified depressive episodes: Secondary | ICD-10-CM

## 2013-04-09 DIAGNOSIS — G4730 Sleep apnea, unspecified: Secondary | ICD-10-CM | POA: Diagnosis present

## 2013-04-09 DIAGNOSIS — Z9884 Bariatric surgery status: Secondary | ICD-10-CM

## 2013-04-09 DIAGNOSIS — I10 Essential (primary) hypertension: Secondary | ICD-10-CM | POA: Diagnosis present

## 2013-04-09 DIAGNOSIS — F329 Major depressive disorder, single episode, unspecified: Secondary | ICD-10-CM

## 2013-04-09 DIAGNOSIS — R4789 Other speech disturbances: Secondary | ICD-10-CM | POA: Insufficient documentation

## 2013-04-09 DIAGNOSIS — E119 Type 2 diabetes mellitus without complications: Secondary | ICD-10-CM

## 2013-04-09 DIAGNOSIS — E1142 Type 2 diabetes mellitus with diabetic polyneuropathy: Secondary | ICD-10-CM | POA: Insufficient documentation

## 2013-04-09 DIAGNOSIS — K920 Hematemesis: Secondary | ICD-10-CM

## 2013-04-09 DIAGNOSIS — K219 Gastro-esophageal reflux disease without esophagitis: Secondary | ICD-10-CM | POA: Insufficient documentation

## 2013-04-09 DIAGNOSIS — R29898 Other symptoms and signs involving the musculoskeletal system: Secondary | ICD-10-CM | POA: Insufficient documentation

## 2013-04-09 DIAGNOSIS — E1149 Type 2 diabetes mellitus with other diabetic neurological complication: Secondary | ICD-10-CM | POA: Diagnosis present

## 2013-04-09 LAB — DIFFERENTIAL
Basophils Absolute: 0 10*3/uL (ref 0.0–0.1)
Basophils Relative: 0 % (ref 0–1)
Monocytes Absolute: 0.5 10*3/uL (ref 0.1–1.0)
Neutro Abs: 2.9 10*3/uL (ref 1.7–7.7)
Neutrophils Relative %: 53 % (ref 43–77)

## 2013-04-09 LAB — COMPREHENSIVE METABOLIC PANEL
ALT: 15 U/L (ref 0–35)
AST: 17 U/L (ref 0–37)
Albumin: 3.8 g/dL (ref 3.5–5.2)
Chloride: 98 mEq/L (ref 96–112)
Creatinine, Ser: 1.2 mg/dL — ABNORMAL HIGH (ref 0.50–1.10)
Potassium: 3.3 mEq/L — ABNORMAL LOW (ref 3.5–5.1)
Sodium: 139 mEq/L (ref 135–145)
Total Bilirubin: 0.4 mg/dL (ref 0.3–1.2)

## 2013-04-09 LAB — PROTIME-INR
INR: 1 (ref 0.00–1.49)
Prothrombin Time: 13 seconds (ref 11.6–15.2)

## 2013-04-09 LAB — POCT I-STAT TROPONIN I

## 2013-04-09 LAB — CBC
MCHC: 33.7 g/dL (ref 30.0–36.0)
Platelets: 174 10*3/uL (ref 150–400)
Platelets: 166 10*3/uL (ref 150–400)
RDW: 13.3 % (ref 11.5–15.5)
RDW: 13.4 % (ref 11.5–15.5)
WBC: 5.4 10*3/uL (ref 4.0–10.5)
WBC: 4.9 10*3/uL (ref 4.0–10.5)

## 2013-04-09 LAB — GLUCOSE, CAPILLARY
Glucose-Capillary: 67 mg/dL — ABNORMAL LOW (ref 70–99)
Glucose-Capillary: 74 mg/dL (ref 70–99)

## 2013-04-09 LAB — POCT I-STAT, CHEM 8
BUN: 10 mg/dL (ref 6–23)
Creatinine, Ser: 1.2 mg/dL — ABNORMAL HIGH (ref 0.50–1.10)
Potassium: 3.4 mEq/L — ABNORMAL LOW (ref 3.5–5.1)
Sodium: 141 mEq/L (ref 135–145)
TCO2: 30 mmol/L (ref 0–100)

## 2013-04-09 LAB — APTT: aPTT: 28 seconds (ref 24–37)

## 2013-04-09 LAB — TROPONIN I: Troponin I: <0.3 ng/mL (ref <0.30)

## 2013-04-09 LAB — CREATININE, SERUM
Creatinine, Ser: 0.95 mg/dL (ref 0.50–1.10)
GFR calc Af Amer: 77 mL/min — ABNORMAL LOW (ref >90)
GFR calc non Af Amer: 66 mL/min — ABNORMAL LOW (ref >90)

## 2013-04-09 MED ORDER — SODIUM CHLORIDE 0.9 % IV BOLUS (SEPSIS)
500.0000 mL | Freq: Once | INTRAVENOUS | Status: AC
  Administered 2013-04-09: 500 mL via INTRAVENOUS

## 2013-04-09 MED ORDER — ASPIRIN 81 MG PO CHEW
324.0000 mg | CHEWABLE_TABLET | Freq: Once | ORAL | Status: AC
  Administered 2013-04-09: 324 mg via ORAL
  Filled 2013-04-09: qty 4

## 2013-04-09 MED ORDER — ARIPIPRAZOLE 10 MG PO TABS
20.0000 mg | ORAL_TABLET | Freq: Every day | ORAL | Status: DC
  Administered 2013-04-09 – 2013-04-10 (×2): 20 mg via ORAL
  Filled 2013-04-09 (×2): qty 2

## 2013-04-09 MED ORDER — IMIPRAMINE HCL 50 MG PO TABS
200.0000 mg | ORAL_TABLET | Freq: Every day | ORAL | Status: DC
  Administered 2013-04-09 – 2013-04-10 (×2): 200 mg via ORAL
  Filled 2013-04-09 (×2): qty 4

## 2013-04-09 MED ORDER — IRBESARTAN 75 MG PO TABS
75.0000 mg | ORAL_TABLET | Freq: Every day | ORAL | Status: DC
  Administered 2013-04-09 – 2013-04-10 (×2): 75 mg via ORAL
  Filled 2013-04-09 (×2): qty 1

## 2013-04-09 MED ORDER — GABAPENTIN 300 MG PO CAPS
300.0000 mg | ORAL_CAPSULE | Freq: Two times a day (BID) | ORAL | Status: DC
Start: 2013-04-09 — End: 2013-04-10
  Administered 2013-04-09 – 2013-04-10 (×2): 300 mg via ORAL
  Filled 2013-04-09 (×3): qty 1

## 2013-04-09 MED ORDER — EZETIMIBE-SIMVASTATIN 10-80 MG PO TABS
1.0000 | ORAL_TABLET | Freq: Every day | ORAL | Status: DC
  Administered 2013-04-09: 1 via ORAL
  Filled 2013-04-09 (×2): qty 1

## 2013-04-09 MED ORDER — POTASSIUM CHLORIDE CRYS ER 20 MEQ PO TBCR
40.0000 meq | EXTENDED_RELEASE_TABLET | Freq: Once | ORAL | Status: AC
  Administered 2013-04-09: 40 meq via ORAL
  Filled 2013-04-09: qty 2

## 2013-04-09 MED ORDER — AMPHETAMINE-DEXTROAMPHETAMINE 15 MG PO TABS: 30.0000 mg | ORAL_TABLET | Freq: Every day | ORAL | Status: DC

## 2013-04-09 MED ORDER — ATENOLOL 25 MG PO TABS
25.0000 mg | ORAL_TABLET | Freq: Every day | ORAL | Status: DC
  Administered 2013-04-09 – 2013-04-10 (×2): 25 mg via ORAL
  Filled 2013-04-09 (×2): qty 1

## 2013-04-09 MED ORDER — MORPHINE SULFATE 4 MG/ML IJ SOLN
4.0000 mg | Freq: Once | INTRAMUSCULAR | Status: AC
  Administered 2013-04-09: 4 mg via INTRAVENOUS
  Filled 2013-04-09: qty 1

## 2013-04-09 MED ORDER — INSULIN ASPART 100 UNIT/ML ~~LOC~~ SOLN: 0.0000 [IU] | Freq: Three times a day (TID) | SUBCUTANEOUS | Status: DC

## 2013-04-09 MED ORDER — AMPHETAMINE-DEXTROAMPHETAMINE 10 MG PO TABS
30.0000 mg | ORAL_TABLET | Freq: Every day | ORAL | Status: DC
  Administered 2013-04-10: 30 mg via ORAL
  Filled 2013-04-09: qty 3

## 2013-04-09 MED ORDER — ASPIRIN 325 MG PO TABS
325.0000 mg | ORAL_TABLET | Freq: Every day | ORAL | Status: DC
  Administered 2013-04-10: 325 mg via ORAL
  Filled 2013-04-09 (×2): qty 1

## 2013-04-09 MED ORDER — INSULIN GLARGINE 100 UNIT/ML ~~LOC~~ SOLN
26.0000 [IU] | Freq: Every day | SUBCUTANEOUS | Status: DC
  Administered 2013-04-09: 26 [IU] via SUBCUTANEOUS
  Filled 2013-04-09 (×2): qty 0.26

## 2013-04-09 MED ORDER — METFORMIN HCL 500 MG PO TABS: 1000.0000 mg | ORAL_TABLET | Freq: Two times a day (BID) | ORAL | Status: DC

## 2013-04-09 MED ORDER — INSULIN ASPART 100 UNIT/ML ~~LOC~~ SOLN: 30.0000 [IU] | Freq: Three times a day (TID) | SUBCUTANEOUS | Status: DC

## 2013-04-09 MED ORDER — DEXTROSE 50 % IV SOLN: 50.0000 mL | Freq: Once | INTRAVENOUS | Status: DC

## 2013-04-09 MED ORDER — PANTOPRAZOLE SODIUM 40 MG PO TBEC
80.0000 mg | DELAYED_RELEASE_TABLET | Freq: Every day | ORAL | Status: DC
  Administered 2013-04-09 – 2013-04-10 (×2): 80 mg via ORAL
  Filled 2013-04-09 (×2): qty 2

## 2013-04-09 MED ORDER — AMPHETAMINE-DEXTROAMPHETAMINE 20 MG PO TABS: 30.0000 mg | ORAL_TABLET | Freq: Every day | ORAL | Status: DC

## 2013-04-09 MED ORDER — ACETAMINOPHEN 325 MG PO TABS: 650.0000 mg | ORAL_TABLET | ORAL | Status: DC | PRN

## 2013-04-09 MED ORDER — ARIPIPRAZOLE 10 MG PO TABS: 20.0000 mg | ORAL_TABLET | Freq: Every day | ORAL | Status: DC

## 2013-04-09 MED ORDER — METFORMIN HCL 500 MG PO TABS
1000.0000 mg | ORAL_TABLET | Freq: Two times a day (BID) | ORAL | Status: DC
  Administered 2013-04-10: 1000 mg via ORAL
  Filled 2013-04-09 (×3): qty 2

## 2013-04-09 MED ORDER — ENOXAPARIN SODIUM 40 MG/0.4ML ~~LOC~~ SOLN
40.0000 mg | SUBCUTANEOUS | Status: DC
  Administered 2013-04-09: 40 mg via SUBCUTANEOUS
  Filled 2013-04-09 (×2): qty 0.4

## 2013-04-09 MED ORDER — FUROSEMIDE 80 MG PO TABS
80.0000 mg | ORAL_TABLET | Freq: Every day | ORAL | Status: DC
  Administered 2013-04-10: 80 mg via ORAL
  Filled 2013-04-09 (×2): qty 1

## 2013-04-09 NOTE — ED Notes (Signed)
Pt given orange juice per Dr. Emilia Beck.

## 2013-04-09 NOTE — Progress Notes (Signed)
Pt. Refused cpap for tonight. 

## 2013-04-09 NOTE — Consult Note (Signed)
Referring Physician: Jodi Mourning    Chief Complaint: Code stroke  HPI:                                                                                                                                         Amy [name] is an 57 y.o. female who was a work when her coworkers and patient noted she had a slurred speech and left arm weakness. Patient was brought to [facility] hospital as a code stroke.  On arrival her NIHSS was 0 and patient was exhibiting stuttering speech which intermittently was fluent, lip smacking (which would cease when my hand was placed on her outer cheek). Patient showed no weakness on exam.  During consultation patient did state she was recently placed on Wellbutrin but she has not had this medication for 7 days.   Date last known well: Date: 04/09/2013 Time last known well: Time: 11:45 tPA Given: No: NIHSS 0  Past Medical History  Diagnosis Date  . DIABETES MELLITUS, TYPE II 03/14/2007  . HYPERTENSION 03/14/2007  . SLEEP APNEA 01/18/2009    CPAP  . OBESITY, MORBID 01/18/2009  . Edema 12/20/2009  . Depression   . Dyslipidemia   . DM type 2 with diabetic peripheral neuropathy     painful  . Osteoarthritis     knees  . CTS (carpal tunnel syndrome)   . Diverticulosis   . Bipolar disorder   . Vitamin D deficiency   . Anemia     unspecified  . GERD (gastroesophageal reflux disease)     Past Surgical History  Procedure Laterality Date  . Laparoscopic gastric banding  2007  . Esophagogastroduodenoscopy  02/03/2004  . Treadmill cardiolite  2002  . Lower arterial  02/03/2004    Family History  Problem Relation Age of Onset  . Heart disease Father   . Breast cancer Sister   . Diabetes Sister   . Colon cancer Neg Hx   . Sarcoidosis Brother    Social History:  reports that she has never smoked. She has never used smokeless tobacco. She reports that she does not drink alcohol or use illicit drugs.  Allergies: No Known Allergies  Medications:                                                                                                                            Current  Facility-Administered Medications  Medication Dose Route Frequency Provider Last Rate Last Dose  . dextrose 50 % solution 50 mL  50 mL Intravenous Once Enid Skeens, MD      . potassium chloride SA (K-DUR,KLOR-CON) CR tablet 40 mEq  40 mEq Oral Once Enid Skeens, MD      . sodium chloride 0.9 % bolus 500 mL  500 mL Intravenous Once Enid Skeens, MD       Current Outpatient Prescriptions  Medication Sig Dispense Refill  . ABILIFY 20 MG tablet daily.      Marland Kitchen amphetamine-dextroamphetamine (ADDERALL) 15 MG tablet Take 2 tablets by mouth once daily       . aspirin 81 MG tablet Take 81 mg by mouth daily.        Marland Kitchen atenolol (TENORMIN) 25 MG tablet Take 25 mg by mouth daily.        . budesonide (RHINOCORT AQUA) 32 MCG/ACT nasal spray Use as needed       . buPROPion (WELLBUTRIN SR) 150 MG 12 hr tablet Take 150 mg by mouth 2 (two) times daily.      . cetirizine (ZYRTEC) 10 MG tablet Take 10 mg by mouth daily.        . ergocalciferol (VITAMIN D2) 50000 UNITS capsule Take 50,000 Units by mouth once a week.        . esomeprazole (NEXIUM) 40 MG capsule Take 40 mg by mouth daily before breakfast.      . ezetimibe-simvastatin (VYTORIN) 10-80 MG per tablet Take 1 tablet by mouth at bedtime.        . furosemide (LASIX) 80 MG tablet Take 80 mg by mouth daily.        Marland Kitchen gabapentin (NEURONTIN) 300 MG capsule       . glucose blood (ACCU-CHEK SMARTVIEW) test strip Use as instructed, dx code 250.00  300 each  2  . imipramine (TOFRANIL) 50 MG tablet 200 mg.       . insulin glargine (LANTUS) 100 UNIT/ML injection Inject 0.26 mLs (26 Units total) into the skin at bedtime.  3 vial  2  . insulin lispro (HUMALOG) 100 UNIT/ML injection Inject into the skin 3 (three) times daily before meals. 0-30-40 units       . Lancets (ACCU-CHEK SOFT TOUCH) lancets Use as instructed dx 250.01       . metFORMIN (GLUCOPHAGE) 1000  MG tablet Take 1,000 mg by mouth 2 (two) times daily with a meal.      . NON FORMULARY CPAP  Wear at night       . telmisartan (MICARDIS) 80 MG tablet Take 80 mg by mouth daily.        . [DISCONTINUED] imipramine (TOFRANIL-PM) 150 MG capsule Take 4 by mouth at bedtime       . [DISCONTINUED] SEROQUEL XR 200 MG 24 hr tablet          ROS:  History obtained from the patient  General ROS: negative for - chills, fatigue, fever, night sweats, weight gain or weight loss Psychological ROS: negative for - behavioral disorder, hallucinations, memory difficulties, mood swings or suicidal ideation Ophthalmic ROS: negative for - blurry vision, double vision, eye pain or loss of vision ENT ROS: negative for - epistaxis, nasal discharge, oral lesions, sore throat, tinnitus or vertigo Allergy and Immunology ROS: negative for - hives or itchy/watery eyes Hematological and Lymphatic ROS: negative for - bleeding problems, bruising or swollen lymph nodes Endocrine ROS: negative for - galactorrhea, hair pattern changes, polydipsia/polyuria or temperature intolerance Respiratory ROS: negative for - cough, hemoptysis, shortness of breath or wheezing Cardiovascular ROS: negative for - chest pain, dyspnea on exertion, edema or irregular heartbeat Gastrointestinal ROS: negative for - abdominal pain, diarrhea, hematemesis, nausea/vomiting or stool incontinence Genito-Urinary ROS: negative for - dysuria, hematuria, incontinence or urinary frequency/urgency Musculoskeletal ROS: negative for - joint swelling or muscular weakness Neurological ROS: as noted in HPI Dermatological ROS: negative for rash and skin lesion changes  Neurologic Examination:                                                                                                      Blood pressure 114/70, pulse 91,  temperature 98.5 F (36.9 C), temperature source Oral, resp. rate 19, SpO2 97.00%.   Mental Status: Alert, oriented, thought content appropriate.  Speech intermittently fluid mixed with stuttering and speech hesitancy.  Able to follow 3 step commands without difficulty. Cranial Nerves: II: Visual fields grossly normal, pupils equal, round, reactive to light and accommodation III,IV, VI: ptosis not present, extra-ocular motions intact bilaterally V,VII: smile symmetric, facial light touch sensation normal bilaterally VIII: hearing normal bilaterally IX,X: gag reflex present XI: bilateral shoulder shrug XII: midline tongue extension Motor: Right : Upper extremity   5/5    Left:     Upper extremity   5/5  Lower extremity   5/5     Lower extremity   5/5 Tone and bulk:normal tone throughout; no atrophy noted Sensory: Pinprick and light touch intact throughout, bilaterally Deep Tendon Reflexes:  Right: Upper Extremity   Left: Upper extremity   biceps (C-5 to C-6) 2/4   biceps (C-5 to C-6) 2/4 tricep (C7) 2/4    triceps (C7) 2/4 Brachioradialis (C6) 2/4  Brachioradialis (C6) 2/4  Lower Extremity Lower Extremity  quadriceps (L-2 to L-4) 2/4   quadriceps (L-2 to L-4) 2/4 Achilles (S1) 2/4   Achilles (S1) 2/4  Plantars: Right: downgoing   Left: downgoing Cerebellar: normal finger-to-nose with jerky motion,  normal heel-to-shin test CV: pulses palpable throughout    Results for orders placed during the hospital encounter of 04/09/13 (from the past 48 hour(s))  GLUCOSE, CAPILLARY     Status: Abnormal   Collection Time    04/09/13 12:45 PM      Result Value Range   Glucose-Capillary 67 (*) 70 - 99 mg/dL   Comment 1 Documented in Chart    PROTIME-INR     Status: None   Collection Time    04/09/13  1:00 PM      Result Value Range   Prothrombin Time 13.0  11.6 - 15.2 seconds   INR 1.00  0.00 - 1.49  APTT     Status: None   Collection Time    04/09/13  1:00 PM      Result Value  Range   aPTT 28  24 - 37 seconds  CBC     Status: None   Collection Time    04/09/13  1:00 PM      Result Value Range   WBC 5.4  4.0 - 10.5 K/uL   RBC 4.31  3.87 - 5.11 MIL/uL   Hemoglobin 13.0  12.0 - 15.0 g/dL   HCT 16.1  09.6 - 04.5 %   MCV 89.6  78.0 - 100.0 fL   MCH 30.2  26.0 - 34.0 pg   MCHC 33.7  30.0 - 36.0 g/dL   RDW 40.9  81.1 - 91.4 %   Platelets 174  150 - 400 K/uL  DIFFERENTIAL     Status: None   Collection Time    04/09/13  1:00 PM      Result Value Range   Neutrophils Relative % 53  43 - 77 %   Neutro Abs 2.9  1.7 - 7.7 K/uL   Lymphocytes Relative 35  12 - 46 %   Lymphs Abs 1.9  0.7 - 4.0 K/uL   Monocytes Relative 9  3 - 12 %   Monocytes Absolute 0.5  0.1 - 1.0 K/uL   Eosinophils Relative 2  0 - 5 %   Eosinophils Absolute 0.1  0.0 - 0.7 K/uL   Basophils Relative 0  0 - 1 %   Basophils Absolute 0.0  0.0 - 0.1 K/uL  POCT I-STAT TROPONIN I     Status: None   Collection Time    04/09/13  1:05 PM      Result Value Range   Troponin i, poc 0.00  0.00 - 0.08 ng/mL   Comment 3            Comment: Due to the release kinetics of cTnI,     a negative result within the first hours     of the onset of symptoms does not rule out     myocardial infarction with certainty.     If myocardial infarction is still suspected,     repeat the test at appropriate intervals.  POCT I-STAT, CHEM 8     Status: Abnormal   Collection Time    04/09/13  1:06 PM      Result Value Range   Sodium 141  135 - 145 mEq/L   Potassium 3.4 (*) 3.5 - 5.1 mEq/L   Chloride 96  96 - 112 mEq/L   BUN 10  6 - 23 mg/dL   Creatinine, Ser 7.82 (*) 0.50 - 1.10 mg/dL   Glucose, Bld 66 (*) 70 - 99 mg/dL   Calcium, Ion 9.56  2.13 - 1.23 mmol/L   TCO2 30  0 - 100 mmol/L   Hemoglobin 14.3  12.0 - 15.0 g/dL   HCT 08.6  57.8 - 46.9 %   Ct Head (brain) Wo Contrast  04/09/2013   *RADIOLOGY REPORT*  Clinical Data: Acute right-sided weakness, code stroke.  CT HEAD WITHOUT CONTRAST  Technique:  Contiguous  axial images were obtained from the base of the skull through the vertex without contrast.  Comparison: 12/04/2008  Findings: There is no evidence of acute intracranial hemorrhage, brain  edema, mass lesion, acute infarction,   mass effect, or midline shift. Acute infarct may be inapparent on noncontrast CT. No other intra-axial abnormalities are seen, and the ventricles and sulci are within normal limits in size and symmetry.   No abnormal extra-axial fluid collections or masses are identified.  No significant calvarial abnormality.  IMPRESSION: 1. Negative for bleed or other acute intracranial process. I discussed the critical test results over the telephone with Dr. Cyril Mourning at the time of interpretation.   Original Report Authenticated By: D. Andria Rhein, MD    Assessment and plan discussed with with attending physician and they are in agreement.    Felicie Morn PA-C [facility] [phone]  04/09/2013, 1:31 PM   Assessment: 57 y.o. female presenting to [facility] hospital as code stroke.  Exam shows no localizing or lateralizing abnormality and some findings suggestive of underlying functional etiology.  Patient was not a tPA candidate due to NIHSS 0.   Recommend: 1) Due to multiple stroke risk factors and history of left sided weakness prior to exam would obtain MRI brain to evaluate for infarct.  If negative would go no further with stroke workup.  2) continue to treat patients underlying bipolar/anxiety  Stroke Risk Factors - diabetes mellitus, hyperlipidemia and hypertension    Patient seen and examined together with physician assistant and I concur with the assessment and plan.  Wyatt Portela, MD

## 2013-04-09 NOTE — ED Provider Notes (Signed)
CSN: [id number]     Arrival date & time 04/09/13  1241 History     First MD Initiated Contact with Patient 04/09/13 1250     Chief Complaint  Patient presents with  . Code Stroke   (Consider location/radiation/quality/duration/timing/severity/associated sxs/prior Treatment) HPI Comments: 57 yo female with DM, depression, htn, obesity presents from work with CODE STROKE at 1145 am.  Pt had acute onset speech difficulty, shakiness and left sided weakness.  No hx of stroke known or head injury.  Family not with pt.  Pt minimal verbal, shaky diffuse.  Pt says wellbutrin new medicine.  No hx of similar. Difficult hx due to minimal verbal, delayed.   The history is provided by the patient and the EMS personnel.    Past Medical History  Diagnosis Date  . DIABETES MELLITUS, TYPE II 03/14/2007  . HYPERTENSION 03/14/2007  . SLEEP APNEA 01/18/2009    CPAP  . OBESITY, MORBID 01/18/2009  . Edema 12/20/2009  . Depression   . Dyslipidemia   . DM type 2 with diabetic peripheral neuropathy     painful  . Osteoarthritis     knees  . CTS (carpal tunnel syndrome)   . Diverticulosis   . Bipolar disorder   . Vitamin D deficiency   . Anemia     unspecified  . GERD (gastroesophageal reflux disease)    Past Surgical History  Procedure Laterality Date  . Laparoscopic gastric banding  2007  . Esophagogastroduodenoscopy  02/03/2004  . Treadmill cardiolite  2002  . Lower arterial  02/03/2004   Family History  Problem Relation Age of Onset  . Heart disease Father   . Breast cancer Sister   . Diabetes Sister   . Colon cancer Neg Hx   . Sarcoidosis Brother    History  Substance Use Topics  . Smoking status: Never Smoker   . Smokeless tobacco: Never Used  . Alcohol Use: No   OB History   Grav Para Term Preterm Abortions TAB SAB Ect Mult Living                 Review of Systems  Unable to perform ROS: Patient nonverbal    Allergies  Review of patient's allergies indicates no known  allergies.  Home Medications   Current Outpatient Rx  Name  Route  Sig  Dispense  Refill  . ABILIFY 20 MG tablet      daily.         Marland Kitchen amphetamine-dextroamphetamine (ADDERALL) 15 MG tablet      Take 2 tablets by mouth once daily          . aspirin 81 MG tablet   Oral   Take 81 mg by mouth daily.           Marland Kitchen atenolol (TENORMIN) 25 MG tablet   Oral   Take 25 mg by mouth daily.           . budesonide (RHINOCORT AQUA) 32 MCG/ACT nasal spray      Use as needed          . buPROPion (WELLBUTRIN SR) 150 MG 12 hr tablet   Oral   Take 150 mg by mouth 2 (two) times daily.         . cetirizine (ZYRTEC) 10 MG tablet   Oral   Take 10 mg by mouth daily.           . ergocalciferol (VITAMIN D2) 50000 UNITS capsule   Oral   Take  50,000 Units by mouth once a week.           . esomeprazole (NEXIUM) 40 MG capsule   Oral   Take 40 mg by mouth daily before breakfast.         . ezetimibe-simvastatin (VYTORIN) 10-80 MG per tablet   Oral   Take 1 tablet by mouth at bedtime.           . furosemide (LASIX) 80 MG tablet   Oral   Take 80 mg by mouth daily.           Marland Kitchen gabapentin (NEURONTIN) 300 MG capsule               . glucose blood (ACCU-CHEK SMARTVIEW) test strip      Use as instructed, dx code 250.00   300 each   2   . imipramine (TOFRANIL) 50 MG tablet      200 mg.          . insulin glargine (LANTUS) 100 UNIT/ML injection   Subcutaneous   Inject 0.26 mLs (26 Units total) into the skin at bedtime.   3 vial   2     Dispense as written.   . insulin lispro (HUMALOG) 100 UNIT/ML injection   Subcutaneous   Inject into the skin 3 (three) times daily before meals. 0-30-40 units          . Lancets (ACCU-CHEK SOFT TOUCH) lancets      Use as instructed dx 250.01          . metFORMIN (GLUCOPHAGE) 1000 MG tablet   Oral   Take 1,000 mg by mouth 2 (two) times daily with a meal.         . NON FORMULARY      CPAP  Wear at night           . telmisartan (MICARDIS) 80 MG tablet   Oral   Take 80 mg by mouth daily.            BP 114/70  Pulse 91  Temp(Src) 98.5 F (36.9 C) (Oral)  Resp 19  SpO2 97% Physical Exam  Nursing note and vitals reviewed. Constitutional: She appears well-developed and well-nourished.  HENT:  Head: Normocephalic and atraumatic.  Mild dry mm  Eyes: Conjunctivae are normal. Right eye exhibits no discharge. Left eye exhibits no discharge.  Neck: Normal range of motion. Neck supple. No tracheal deviation present.  Cardiovascular: Normal rate and regular rhythm.   Pulmonary/Chest: Effort normal and breath sounds normal.  Abdominal: Soft. She exhibits no distension. There is no tenderness. There is no guarding.  Musculoskeletal: She exhibits no edema and no tenderness.  Neurological: She is alert. GCS eye subscore is 4. GCS verbal subscore is 5. GCS motor subscore is 6.  Diffuse shaky/ mild tremor Moves all ext equal bilateral Mild dysmetria bilateral Sensation intact Vision grossly intact No drift or droop CNs grossly intact except verbal- broken / hesitant speech  Skin: Skin is warm. No rash noted.  Psychiatric: Her mood appears anxious. Her affect is blunt.  anxious    ED Course   Procedures (including critical care time)  Labs Reviewed  GLUCOSE, CAPILLARY - Abnormal; Notable for the following:    Glucose-Capillary 67 (*)    All other components within normal limits  COMPREHENSIVE METABOLIC PANEL - Abnormal; Notable for the following:    Potassium 3.3 (*)    CO2 33 (*)    Glucose, Bld 67 (*)  Creatinine, Ser 1.20 (*)    GFR calc non Af Amer 50 (*)    GFR calc Af Amer 58 (*)    All other components within normal limits  HEMOGLOBIN A1C - Abnormal; Notable for the following:    Hemoglobin A1C 6.7 (*)    Mean Plasma Glucose 146 (*)    All other components within normal limits  CREATININE, SERUM - Abnormal; Notable for the following:    GFR calc non Af Amer 66 (*)    GFR  calc Af Amer 77 (*)    All other components within normal limits  GLUCOSE, CAPILLARY - Abnormal; Notable for the following:    Glucose-Capillary 158 (*)    All other components within normal limits  URINE RAPID DRUG SCREEN (HOSP PERFORMED) - Abnormal; Notable for the following:    Opiates POSITIVE (*)    Amphetamines POSITIVE (*)    All other components within normal limits  POCT I-STAT, CHEM 8 - Abnormal; Notable for the following:    Potassium 3.4 (*)    Creatinine, Ser 1.20 (*)    Glucose, Bld 66 (*)    All other components within normal limits  PROTIME-INR  APTT  CBC  DIFFERENTIAL  TROPONIN I  TROPONIN I  LIPID PANEL  CBC  GLUCOSE, CAPILLARY  GLUCOSE, CAPILLARY  GLUCOSE, CAPILLARY  POCT I-STAT TROPONIN I   Ct Head (brain) Wo Contrast  04/09/2013   *RADIOLOGY REPORT*  Clinical Data: Acute right-sided weakness, code stroke.  CT HEAD WITHOUT CONTRAST  Technique:  Contiguous axial images were obtained from the base of the skull through the vertex without contrast.  Comparison: 12/04/2008  Findings: There is no evidence of acute intracranial hemorrhage, brain edema, mass lesion, acute infarction,   mass effect, or midline shift. Acute infarct may be inapparent on noncontrast CT. No other intra-axial abnormalities are seen, and the ventricles and sulci are within normal limits in size and symmetry.   No abnormal extra-axial fluid collections or masses are identified.  No significant calvarial abnormality.  IMPRESSION: 1. Negative for bleed or other acute intracranial process. I discussed the critical test results over the telephone with Dr. Cyril Mourning at the time of interpretation.   Original Report Authenticated By: D. Andria Rhein, MD   No diagnosis found.  MDM  Clinically patient presented as medicines SE/ stress, not classic for stroke. With sxs possibly concerning for TIA/ stroke in the field a code stroke called. Neuro assisted in ED Evaluation. CT and mRi unremarkable.  Pt  gradually improved in ED however speech still shaky/ not at baseline per family. Pt had episode of chest pain in ED, troponin and EKG unremarkable. Admitted to tele for further eval. Spoke with hospitalist. Accepted.   Date: 04/10/2013  Rate: 93  Rhythm: sinus  QRS Axis: normal  Intervals: mild qt prolonged  ST/T Wave abnormalities: non specific T wave - lateral inversions  Conduction Disutrbancesnone  Narrative Interpretation:   Old EKG Reviewed: similar  Repeat with CP episode Date: 04/10/2013  Rate: 93  Rhythm: sinus  QRS Axis: normal  Intervals: mild qt prolonged  ST/T Wave abnormalities: non specific T wave - lateral inversions  Conduction Disutrbancesnone  Narrative Interpretation:   Old EKG Reviewed: similar  Pt admitted to tele for AMS, stroke like sxs, arf, chest pain  Enid Skeens, MD 04/10/13 2149

## 2013-04-09 NOTE — ED Notes (Signed)
Pt states at 1145 onset of left leg weakness and slurred speech.  cbg 67 at triage

## 2013-04-09 NOTE — H&P (Signed)
[facility] History and Physical  Amy [name]:[id number] DOB: [birth year] DOA: 04/09/2013  Referring physician:  PCP: Cala Bradford, MD  Specialists:   Chief Complaint: Right-sided weakness slurred speech  HPI: Amy [name] is a 57 y.o. female PMHx HTN, DM sleep apnea on CPAP, TIA 2012. Today was at work when her coworkers and patient noted she had a slurred speech and left arm weakness. Patient was brought to [facility] hospital as a code stroke. On arrival her NIHSS was 0 and patient was exhibiting stuttering speech which intermittently was fluent, lip smacking (which would cease when my hand was placed on her outer cheek). Patient showed no weakness on exam. During consultation patient did state she was recently placed on Wellbutrin but she has not had this medication for 7 days. Patient was seen in the ED by neurology (Dr.Camilo Georga Hacking). Currently still has some slurred speech and difficulty ambulating    Procedure Brain MRI 04/09/2013 No acute abnormality. Mild chronic small vessel disease of the  cerebral hemispheric white matter.\  Consult Neurology(Dr.Camilo Georga Hacking)    Review of Systems: The patient denies anorexia, fever, weight loss,, vision loss, decreased hearing, hoarseness, chest pain, syncope, dyspnea on exertion, peripheral edema, hemoptysis, abdominal pain, melena, hematochezia, severe indigestion/heartburn, hematuria, incontinence, genital sores, suspicious skin lesions, transient blindness, unusual weight change, abnormal bleeding, enlarged lymph nodes, angioedema, and breast masses.    Past Medical History  Diagnosis Date  . DIABETES MELLITUS, TYPE II 03/14/2007  . HYPERTENSION 03/14/2007  . SLEEP APNEA 01/18/2009    CPAP  . OBESITY, MORBID 01/18/2009  . Edema 12/20/2009  . Depression   . Dyslipidemia   . DM type 2 with diabetic peripheral neuropathy     painful  . Osteoarthritis     knees  . CTS (carpal tunnel syndrome)   . Diverticulosis   . Bipolar  disorder   . Vitamin D deficiency   . Anemia     unspecified  . GERD (gastroesophageal reflux disease)    Past Surgical History  Procedure Laterality Date  . Laparoscopic gastric banding  2007  . Esophagogastroduodenoscopy  02/03/2004  . Treadmill cardiolite  2002  . Lower arterial  02/03/2004   Social History:  reports that she has never smoked. She has never used smokeless tobacco. She reports that she does not drink alcohol or use illicit drugs.  where does patient live--home, ALF, with family  Can patient participate in ADLs?     Yes  No Known Allergies  Family History  Problem Relation Age of Onset  . Heart disease Father   . Breast cancer Sister   . Diabetes Sister   . Colon cancer Neg Hx   . Sarcoidosis Brother      Prior to Admission medications   Medication Sig Start Date End Date Taking? Authorizing Provider  ABILIFY 20 MG tablet daily. 10/09/11   Historical Provider, MD  amphetamine-dextroamphetamine (ADDERALL) 15 MG tablet Take 2 tablets by mouth once daily     Historical Provider, MD  aspirin 81 MG tablet Take 81 mg by mouth daily.      Historical Provider, MD  atenolol (TENORMIN) 25 MG tablet Take 25 mg by mouth daily.      Historical Provider, MD  budesonide (RHINOCORT AQUA) 32 MCG/ACT nasal spray Use as needed     Historical Provider, MD  buPROPion (WELLBUTRIN SR) 150 MG 12 hr tablet Take 150 mg by mouth 2 (two) times daily.    Historical Provider, MD  cetirizine (ZYRTEC)  10 MG tablet Take 10 mg by mouth daily.      Historical Provider, MD  ergocalciferol (VITAMIN D2) 50000 UNITS capsule Take 50,000 Units by mouth once a week.      Historical Provider, MD  esomeprazole (NEXIUM) 40 MG capsule Take 40 mg by mouth daily before breakfast.    Historical Provider, MD  ezetimibe-simvastatin (VYTORIN) 10-80 MG per tablet Take 1 tablet by mouth at bedtime.      Historical Provider, MD  furosemide (LASIX) 80 MG tablet Take 80 mg by mouth daily.      Historical Provider,  MD  gabapentin (NEURONTIN) 300 MG capsule  08/02/11   Historical Provider, MD  glucose blood (ACCU-CHEK SMARTVIEW) test strip Use as instructed, dx code 250.00 03/18/13   Reather Littler, MD  imipramine (TOFRANIL) 50 MG tablet 200 mg.  07/17/11   Historical Provider, MD  insulin glargine (LANTUS) 100 UNIT/ML injection Inject 0.26 mLs (26 Units total) into the skin at bedtime. 03/18/13 03/18/14  Reather Littler, MD  insulin lispro (HUMALOG) 100 UNIT/ML injection Inject into the skin 3 (three) times daily before meals. 0-30-40 units  11/21/10   Romero Belling, MD  Lancets (ACCU-CHEK SOFT TOUCH) lancets Use as instructed dx 250.01     Historical Provider, MD  metFORMIN (GLUCOPHAGE) 1000 MG tablet Take 1,000 mg by mouth 2 (two) times daily with a meal.    Historical Provider, MD  NON FORMULARY CPAP  Wear at night     Historical Provider, MD  telmisartan (MICARDIS) 80 MG tablet Take 80 mg by mouth daily.      Historical Provider, MD   Physical Exam: Filed Vitals:   04/09/13 1530 04/09/13 1545 04/09/13 1600 04/09/13 1816  BP: 108/69 111/70 113/68 124/71  Pulse:   76 70  Temp:    97.9 F (36.6 C)  TempSrc:      Resp:    18  Height:    5\' 5"  (1.651 m)  Weight:    77.021 kg (169 lb 12.8 oz)  SpO2:   99% 100%     General: Alert,NAD  Eyes: Equal round reactive to light and accommodation  ENT: Uvula midline   Cardiovascular: Regular rhythm and rate, negative murmurs rubs gallops, DP/PT pulse 2+ bilateral  Respiratory: Clear to auscultation bilateral  Abdomen: Soft nontender nondistended plus bowel sounds  Neurologic: Alert and oriented x4, renal nerves II through XII intact, positive right upper [place] muscle strength 4/5, positive decreased strength right lower extremity 4/5. Negative Romberg negative pronator drift, unable to walk on toes or balance on one leg, unable to walk on heels, drags right leg on ambulating  Labs on Admission:  Basic Metabolic Panel:  Recent Labs Lab 04/09/13 1300  04/09/13 1306  NA 139 141  K 3.3* 3.4*  CL 98 96  CO2 33*  --   GLUCOSE 67* 66*  BUN 11 10  CREATININE 1.20* 1.20*  CALCIUM 9.4  --    Liver Function Tests:  Recent Labs Lab 04/09/13 1300  AST 17  ALT 15  ALKPHOS 112  BILITOT 0.4  PROT 7.4  ALBUMIN 3.8   No results found for this basename: LIPASE, AMYLASE,  in the last 168 hours No results found for this basename: AMMONIA,  in the last 168 hours CBC:  Recent Labs Lab 04/09/13 1300 04/09/13 1306  WBC 5.4  --   NEUTROABS 2.9  --   HGB 13.0 14.3  HCT 38.6 42.0  MCV 89.6  --   PLT 174  --  Cardiac Enzymes:  Recent Labs Lab 04/09/13 1300 04/09/13 1617  TROPONINI <0.30 <0.30    BNP (last 3 results) No results found for this basename: PROBNP,  in the last 8760 hours CBG:  Recent Labs Lab 04/09/13 1245  GLUCAP 67*    Radiological Exams on Admission: Ct Head (brain) Wo Contrast  04/09/2013   *RADIOLOGY REPORT*  Clinical Data: Acute right-sided weakness, code stroke.  CT HEAD WITHOUT CONTRAST  Technique:  Contiguous axial images were obtained from the base of the skull through the vertex without contrast.  Comparison: 12/04/2008  Findings: There is no evidence of acute intracranial hemorrhage, brain edema, mass lesion, acute infarction,   mass effect, or midline shift. Acute infarct may be inapparent on noncontrast CT. No other intra-axial abnormalities are seen, and the ventricles and sulci are within normal limits in size and symmetry.   No abnormal extra-axial fluid collections or masses are identified.  No significant calvarial abnormality.  IMPRESSION: 1. Negative for bleed or other acute intracranial process. I discussed the critical test results over the telephone with Dr. Cyril Mourning at the time of interpretation.   Original Report Authenticated By: D. Andria Rhein, MD   Mr Brain Wo Contrast  04/09/2013   *RADIOLOGY REPORT*  Clinical Data: Slurred speech.  Left arm weakness.  MRI HEAD WITHOUT CONTRAST   Technique:  Multiplanar, multiecho pulse sequences of the brain and surrounding structures were obtained according to standard protocol without intravenous contrast.  Comparison: Head CT same day  Findings: Diffusion imaging does not show any acute or subacute infarction.  The the brainstem and cerebellum are normal.  The cerebral hemispheres show scattered foci of T2 and FLAIR signal within the deep and subcortical white matter consistent with mild chronic small vessel disease.  No cortical or large vessel territory infarction.  No mass lesion, hemorrhage, hydrocephalus or extra-axial collection.  The sinuses are clear.  No pituitary mass. No skull or skull base lesion.  IMPRESSION: No acute abnormality.  Mild chronic small vessel disease of the cerebral hemispheric white matter.   Original Report Authenticated By: Paulina Fusi, M.D.    EKG: No previous EKG for comparison; normal sinus rhythm, diffuse ST-T wave abnormalities. Cannot rule out ischemia  Assessment/Plan Principal Problem:   TIA (transient ischemic attack) Active Problems:   HYPERTENSION   SLEEP APNEA   1. TIA; patient most likely has a TIA as the signs symptoms are similar to her previous TIA in 2012, although atypical reaction to Wellbutrin cannot be rule out. Therefore hold Wellbutrin. Per neurology with negative MRI no further CVA workup for 2. HTN; controlled on current medication 3. Sleep apnea; consult respiratory for in-house CPAP machine 4. EKG abnormality; obtain echocardiogram   Family Communication: Husband and sister present for discussion of plan of care Disposition Plan: ??  Time spent: 60 min  Drema Dallas [facility] Pager [phone]  If 7PM-7AM, please contact night-coverage [email or web address] Password [facility] 04/09/2013, 7:17 PM

## 2013-04-09 NOTE — Code Documentation (Signed)
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  LKW 1145.  NIHSS 0 on arrival.  Patient does have hesitancy of speech and lip smacking.  CT unremarkable.  Neurologist suspects psychogenic event.  Will get MRI to rule out acute ischemic event.  Handoff with ED RN Melissa at bedside.

## 2013-04-10 ENCOUNTER — Encounter (HOSPITAL_COMMUNITY): Payer: Self-pay | Admitting: General Practice

## 2013-04-10 DIAGNOSIS — G459 Transient cerebral ischemic attack, unspecified: Secondary | ICD-10-CM

## 2013-04-10 LAB — RAPID URINE DRUG SCREEN, HOSP PERFORMED
Barbiturates: NOT DETECTED
Benzodiazepines: NOT DETECTED
Cocaine: NOT DETECTED
Tetrahydrocannabinol: NOT DETECTED

## 2013-04-10 LAB — LIPID PANEL
HDL: 50 mg/dL (ref >39)
LDL Cholesterol: 70 mg/dL (ref 0–99)
Total CHOL/HDL Ratio: 2.8 RATIO

## 2013-04-10 LAB — GLUCOSE, CAPILLARY: Glucose-Capillary: 82 mg/dL (ref 70–99)

## 2013-04-10 LAB — HEMOGLOBIN A1C: Hgb A1c MFr Bld: 6.7 % — ABNORMAL HIGH (ref <5.7)

## 2013-04-10 NOTE — Progress Notes (Signed)
Pt was discharged home to private residence per MD order. Pt verbalized understanding of discharge teaching. Pt was alert & oriented and had no complaints of pain. Pt was taken down to private vehicle VIA wheelchair by nurse tech. Ilean Skill, Obelia Bonello R, RN

## 2013-04-10 NOTE — Progress Notes (Signed)
NEURO HOSPITALIST PROGRESS NOTE   SUBJECTIVE:                                                                                                                        Patient is feeling much better today. She still is having some oral/buccal facial movements --when talking or when distracted movements stop  OBJECTIVE:                                                                                                                           Vital signs in last 24 hours: Temp:  [97.9 F (36.6 C)-98.5 F (36.9 C)] 98 F (36.7 C) (08/14 0746) Pulse Rate:  [70-91] 71 (08/14 0746) Resp:  [11-19] 18 (08/14 0746) BP: (100-124)/(62-74) 116/64 mmHg (08/14 0746) SpO2:  [96 %-100 %] 100 % (08/14 0746) Weight:  [77.021 kg (169 lb 12.8 oz)] 77.021 kg (169 lb 12.8 oz) (08/13 1816)  Intake/Output from previous day:   Intake/Output this shift:   Nutritional status: Carb Control  Past Medical History  Diagnosis Date  . DIABETES MELLITUS, TYPE II 03/14/2007  . HYPERTENSION 03/14/2007  . SLEEP APNEA 01/18/2009    CPAP  . OBESITY, MORBID 01/18/2009  . Edema 12/20/2009  . Depression   . Dyslipidemia   . DM type 2 with diabetic peripheral neuropathy     painful  . Osteoarthritis     knees  . CTS (carpal tunnel syndrome)   . Diverticulosis   . Bipolar disorder   . Vitamin D deficiency   . Anemia     unspecified  . GERD (gastroesophageal reflux disease)       Neurologic Exam:   Mental Status:  Alert, oriented, thought content appropriate. Speech now showing no issues and is fluent. When not talking she will have intermittent oral/buccal movements but the cease when talking or distracted. Able to follow 3 step commands without difficulty.  Cranial Nerves:  II: Visual fields grossly normal, pupils equal, round, reactive to light and accommodation  III,IV, VI: ptosis not present, extra-ocular motions intact bilaterally  V,VII: smile symmetric, facial light  touch sensation normal bilaterally  VIII: hearing normal bilaterally  IX,X: gag reflex present  XI: bilateral shoulder shrug  XII: midline tongue extension  Motor:  Moving all extremities  5/5 strength Sensory: Pinprick and light touch intact throughout, bilaterally  Deep Tendon Reflexes:  2+ throughout  Plantars:  Right: downgoing   Left: downgoing  Cerebellar:  normal finger-to-nose with jerky motion, normal heel-to-shin test  CV: pulses palpable throughout     Lab Results: Lab Results  Component Value Date/Time   CHOL 139 04/10/2013  5:18 AM   Lipid Panel  Recent Labs  04/10/13 0518  CHOL 139  TRIG 94  HDL 50  CHOLHDL 2.8  VLDL 19  LDLCALC 70    Studies/Results: Ct Head (brain) Wo Contrast  04/09/2013   *RADIOLOGY REPORT*  Clinical Data: Acute right-sided weakness, code stroke.  CT HEAD WITHOUT CONTRAST  Technique:  Contiguous axial images were obtained from the base of the skull through the vertex without contrast.  Comparison: 12/04/2008  Findings: There is no evidence of acute intracranial hemorrhage, brain edema, mass lesion, acute infarction,   mass effect, or midline shift. Acute infarct may be inapparent on noncontrast CT. No other intra-axial abnormalities are seen, and the ventricles and sulci are within normal limits in size and symmetry.   No abnormal extra-axial fluid collections or masses are identified.  No significant calvarial abnormality.  IMPRESSION: 1. Negative for bleed or other acute intracranial process. I discussed the critical test results over the telephone with Dr. Cyril Mourning at the time of interpretation.   Original Report Authenticated By: D. Andria Rhein, MD   Mr Brain Wo Contrast  04/09/2013   *RADIOLOGY REPORT*  Clinical Data: Slurred speech.  Left arm weakness.  MRI HEAD WITHOUT CONTRAST  Technique:  Multiplanar, multiecho pulse sequences of the brain and surrounding structures were obtained according to standard protocol without intravenous  contrast.  Comparison: Head CT same day  Findings: Diffusion imaging does not show any acute or subacute infarction.  The the brainstem and cerebellum are normal.  The cerebral hemispheres show scattered foci of T2 and FLAIR signal within the deep and subcortical white matter consistent with mild chronic small vessel disease.  No cortical or large vessel territory infarction.  No mass lesion, hemorrhage, hydrocephalus or extra-axial collection.  The sinuses are clear.  No pituitary mass. No skull or skull base lesion.  IMPRESSION: No acute abnormality.  Mild chronic small vessel disease of the cerebral hemispheric white matter.   Original Report Authenticated By: Paulina Fusi, M.D.    MEDICATIONS                                                                                                                        Scheduled: . amphetamine-dextroamphetamine  30 mg Oral Q breakfast  . ARIPiprazole  20 mg Oral Daily  . aspirin  325 mg Oral Daily  . atenolol  25 mg Oral Daily  . enoxaparin (LOVENOX) injection  40 mg Subcutaneous Q24H  . ezetimibe-simvastatin  1 tablet Oral QHS  . furosemide  80 mg Oral Daily  . gabapentin  300 mg Oral BID  .  imipramine  200 mg Oral Daily  . insulin aspart  0-15 Units Subcutaneous TID WC  . insulin glargine  26 Units Subcutaneous QHS  . irbesartan  75 mg Oral Daily  . metFORMIN  1,000 mg Oral BID WC  . pantoprazole  80 mg Oral Q1200   VASCULAR LAB  PRELIMINARY PRELIMINARY PRELIMINARY PRELIMINARY  Carotid duplex completed.  Preliminary report: Bilateral: 1-39% ICA stenosis. Vertebral artery flow is antegrade.   MRI Brain: Findings: Diffusion imaging does not show any acute or subacute  infarction. The the brainstem and cerebellum are normal. The  cerebral hemispheres show scattered foci of T2 and FLAIR signal  within the deep and subcortical white matter consistent with mild  chronic small vessel disease. No cortical or large vessel  territory infarction. No  mass lesion, hemorrhage, hydrocephalus or  extra-axial collection. The sinuses are clear. No pituitary mass.  No skull or skull base lesion.  IMPRESSION:  No acute abnormality. Mild chronic small vessel disease of the  cerebral hemispheric white matter.    ASSESSMENT/PLAN:                                                                                                             57 YO female with improving orobuccal movements.  MRI brain negative for acute stroke. Patient admitted to being under significant stress and recently was placed on Wellbutrin one week ago and titrated up very quickly.  Etiology of oral movements not clear but may be secondary to Wellbutrin.   Recommend: 1) D/C Wellbutrin 2) Follow up out patient with her psychologist.  Neurology S/O  Assessment and plan discussed with with attending physician and they are in agreement.    Felicie Morn PA-C [facility] [phone]  04/10/2013, 10:42 AM

## 2013-04-10 NOTE — Discharge Summary (Signed)
PATIENT DETAILS Name: Amy Amy Age: 57 y.o. Sex: female Date of Birth: 11-22-1955 MRN: [id number]. Admit Date: 04/09/2013 Admitting Physician: Drema Dallas, MD WUJ:WJXBJ,YNWGNFA S, MD  Recommendations for Outpatient Follow-up:  1. Stopped Wellbutrin 2. Periodic surveillance with Carodit Dopplers-preliminary results show 0-30 % stenosis bilaterally  PRIMARY DISCHARGE DIAGNOSIS:  Principal Problem:   TIA (transient ischemic attack) vs Adverse effects of Wellbutrin Active Problems:   DIABETES MELLITUS, TYPE II   HYPERTENSION   SLEEP APNEA     PAST MEDICAL HISTORY: Past Medical History  Diagnosis Date  . DIABETES MELLITUS, TYPE II 03/14/2007  . HYPERTENSION 03/14/2007  . SLEEP APNEA 01/18/2009    CPAP  . OBESITY, MORBID 01/18/2009  . Edema 12/20/2009  . Depression   . Dyslipidemia   . DM type 2 with diabetic peripheral neuropathy     painful  . Osteoarthritis     knees  . CTS (carpal tunnel syndrome)   . Diverticulosis   . Bipolar disorder   . Vitamin D deficiency   . Anemia     unspecified  . GERD (gastroesophageal reflux disease)   . Transient ischemic attack (TIA) 04/09/2013    DISCHARGE MEDICATIONS:   Medication List    STOP taking these medications       buPROPion 150 MG 12 hr tablet  Commonly known as:  WELLBUTRIN SR      TAKE these medications       ABILIFY 20 MG tablet  Generic drug:  ARIPiprazole  Take 20 mg by mouth daily.     accu-chek soft touch lancets  Use as instructed dx 250.01     amphetamine-dextroamphetamine 15 MG tablet  Commonly known as:  ADDERALL  Take 15 mg by mouth daily.     aspirin 81 MG tablet  Take 81 mg by mouth daily.     atenolol 25 MG tablet  Commonly known as:  TENORMIN  Take 25 mg by mouth daily.     cetirizine 10 MG tablet  Commonly known as:  ZYRTEC  Take 10 mg by mouth daily as needed for allergies.     ergocalciferol 50000 UNITS capsule  Commonly known as:  VITAMIN D2  Take 50,000 Units by mouth once  a week.     esomeprazole 40 MG capsule  Commonly known as:  NEXIUM  Take 40 mg by mouth daily before breakfast.     ezetimibe-simvastatin 10-80 MG per tablet  Commonly known as:  VYTORIN  Take 1 tablet by mouth at bedtime.     FER-IRON PO  Take 1 tablet by mouth daily.     furosemide 80 MG tablet  Commonly known as:  LASIX  Take 80 mg by mouth daily.     gabapentin 300 MG capsule  Commonly known as:  NEURONTIN  Take 600 mg by mouth 3 (three) times daily.     glucose blood test strip  Commonly known as:  ACCU-CHEK SMARTVIEW  Use as instructed, dx code 250.00     imipramine 50 MG tablet  Commonly known as:  TOFRANIL  200 mg.     insulin glargine 100 UNIT/ML injection  Commonly known as:  LANTUS  Inject 0.26 mLs (26 Units total) into the skin at bedtime.     insulin lispro 100 UNIT/ML injection  Commonly known as:  HUMALOG  Inject 1-6 Units into the skin 3 (three) times daily before meals.     metFORMIN 1000 MG tablet  Commonly known as:  GLUCOPHAGE  Take 1,000  mg by mouth 2 (two) times daily with a meal.     NON FORMULARY  at bedtime.     RHINOCORT AQUA 32 MCG/ACT nasal spray  Generic drug:  budesonide  Place 1 spray into the nose daily as needed (congestion). Use as needed     telmisartan 80 MG tablet  Commonly known as:  MICARDIS  Take 80 mg by mouth daily.        ALLERGIES:  No Known Allergies  BRIEF HPI:  See H&P, Labs, Consult and Test reports for all details in brief, patient is a 57 year old female with DM, HTN, TIA who recently was started on Wellbutrin and uptitrated quickly, was brought to the hospital from her work when her coworkers and patient noted she had a slurred speech and left arm weakness. She was exhibiting stuttering speech which intermittently was fluent, lip smacking. She has also been on stress at home.  CONSULTATIONS:   neurology  PERTINENT RADIOLOGIC STUDIES: Ct Head (brain) Wo Contrast  04/09/2013   *RADIOLOGY REPORT*   Clinical Data: Acute right-sided weakness, code stroke.  CT HEAD WITHOUT CONTRAST  Technique:  Contiguous axial images were obtained from the base of the skull through the vertex without contrast.  Comparison: 12/04/2008  Findings: There is no evidence of acute intracranial hemorrhage, brain edema, mass lesion, acute infarction,   mass effect, or midline shift. Acute infarct may be inapparent on noncontrast CT. No other intra-axial abnormalities are seen, and the ventricles and sulci are within normal limits in size and symmetry.   No abnormal extra-axial fluid collections or masses are identified.  No significant calvarial abnormality.  IMPRESSION: 1. Negative for bleed or other acute intracranial process. I discussed the critical test results over the telephone with Dr. Cyril Mourning at the time of interpretation.   Original Report Authenticated By: D. Andria Rhein, MD   Mr Brain Wo Contrast  04/09/2013   *RADIOLOGY REPORT*  Clinical Data: Slurred speech.  Left arm weakness.  MRI HEAD WITHOUT CONTRAST  Technique:  Multiplanar, multiecho pulse sequences of the brain and surrounding structures were obtained according to standard protocol without intravenous contrast.  Comparison: Head CT same day  Findings: Diffusion imaging does not show any acute or subacute infarction.  The the brainstem and cerebellum are normal.  The cerebral hemispheres show scattered foci of T2 and FLAIR signal within the deep and subcortical white matter consistent with mild chronic small vessel disease.  No cortical or large vessel territory infarction.  No mass lesion, hemorrhage, hydrocephalus or extra-axial collection.  The sinuses are clear.  No pituitary mass. No skull or skull base lesion.  IMPRESSION: No acute abnormality.  Mild chronic small vessel disease of the cerebral hemispheric white matter.   Original Report Authenticated By: Paulina Fusi, M.D.     PERTINENT LAB RESULTS: CBC:  Recent Labs  04/09/13 1300 04/09/13 1306  04/09/13 1948  WBC 5.4  --  4.9  HGB 13.0 14.3 12.2  HCT 38.6 42.0 36.7  PLT 174  --  166   CMET CMP     Component Value Date/Time   NA 141 04/09/2013 1306   K 3.4* 04/09/2013 1306   CL 96 04/09/2013 1306   CO2 33* 04/09/2013 1300   GLUCOSE 66* 04/09/2013 1306   BUN 10 04/09/2013 1306   CREATININE 0.95 04/09/2013 1948   CALCIUM 9.4 04/09/2013 1300   CALCIUM 9.2 01/18/2009 2305   PROT 7.4 04/09/2013 1300   ALBUMIN 3.8 04/09/2013 1300   AST  17 04/09/2013 1300   ALT 15 04/09/2013 1300   ALKPHOS 112 04/09/2013 1300   BILITOT 0.4 04/09/2013 1300   GFRNONAA 66* 04/09/2013 1948   GFRAA 77* 04/09/2013 1948    GFR Estimated Creatinine Clearance: 68.7 ml/min (by C-G formula based on Cr of 0.95). No results found for this basename: LIPASE, AMYLASE,  in the last 72 hours  Recent Labs  04/09/13 1300 04/09/13 1617  TROPONINI <0.30 <0.30   No components found with this basename: POCBNP,  No results found for this basename: DDIMER,  in the last 72 hours  Recent Labs  04/09/13 1948  HGBA1C PENDING    Recent Labs  04/10/13 0518  CHOL 139  HDL 50  LDLCALC 70  TRIG 94  CHOLHDL 2.8   No results found for this basename: TSH, T4TOTAL, FREET3, T3FREE, THYROIDAB,  in the last 72 hours No results found for this basename: VITAMINB12, FOLATE, FERRITIN, TIBC, IRON, RETICCTPCT,  in the last 72 hours Coags:  Recent Labs  04/09/13 1300  INR 1.00   Microbiology: No results found for this or any previous visit (from the past 240 hour(s)).   BRIEF HOSPITAL COURSE:   Principal Problem:   Speech difficulty/?right sided weakness -suspected etiology to be from Wellbutrin rather than TIA.  -Patient was admitted and underwent a TIA work up-which was negative-MRI Brain negative, Echo did not show any major abnormalities. Carotid doppler showed minimal stenosis. Neurology did not have any further recommendations, other than stopping Wellbutrin.Clinically, compared to yesterday, patient is  significantly better, her speech has improved, although not back to usual baseline. I have asked her to give this some time, and to see her psychiatrist for another option for Wellbutrin. She was also recommended that if her speech difficulty still was present in the next few weeks, then she will need evaluation by Neurology  Active Problems:   DIABETES MELLITUS, TYPE II -resume home medications on discharge    HYPERTENSION -continue with Lasix and Atenolol  Rest of her medical issues are stable   TODAY-DAY OF DISCHARGE:  Subjective:   Amy Amy today has no headache,no chest abdominal pain,no new weakness tingling or numbness. She is ambulating independently in the room, and is stable for discharge  Objective:   Blood pressure 122/73, pulse 74, temperature 98.3 F (36.8 C), temperature source Oral, resp. rate 18, height 5\' 5"  (1.651 m), weight 77.021 kg (169 lb 12.8 oz), SpO2 100.00%. No intake or output data in the 24 hours ending 04/10/13 1415 Filed Weights   04/09/13 1816  Weight: 77.021 kg (169 lb 12.8 oz)    Exam Awake Alert, Oriented *3, No new F.N deficits, Normal affect [place].AT,PERRAL Supple Neck,No JVD, No cervical lymphadenopathy appriciated.  Symmetrical Chest wall movement, Good air movement bilaterally, CTAB RRR,No Gallops,Rubs or new Murmurs, No Parasternal Heave +ve B.Sounds, Abd Soft, Non tender, No organomegaly appriciated, No rebound -guarding or rigidity. No Cyanosis, Clubbing or edema, No new Rash or bruise  DISCHARGE CONDITION: Stable  DISPOSITION: Home  DISCHARGE INSTRUCTIONS:    Activity:  As tolerated   Diet recommendation: Diabetic Diet Heart Healthy diet       Discharge Orders   Future Appointments Provider Department Dept Phone   04/21/2013 8:00 AM [facility] PRIMARY CARE ENDOCRINOLOGY [phone]   04/23/2013 3:45 PM Reather Littler, MD [facility] PRIMARY CARE ENDOCRINOLOGY [phone]   06/12/2013 4:00 PM [facility], [place] [phone]   08/14/2013 4:15 PM [facility] [place]  [employer] [facility], [place] [phone]   Future Orders Complete By Expires   Call MD for:  persistant dizziness or light-headedness  As directed    Diet - low sodium heart healthy  As directed    Increase activity slowly  As directed       Follow-up Information   Follow up with Cala Bradford, MD. Schedule an appointment as soon as possible for a visit in 1 week.   Specialty:  Family Medicine   Contact information:   [address] [place] [address] [phone]         Total Time spent on discharge equals 45 minutes.  SignedJeoffrey Massed 04/10/2013 2:15 PM

## 2013-04-10 NOTE — Progress Notes (Signed)
Inpatient Diabetes Program Recommendations  AACE/ADA: New Consensus Statement on Inpatient Glycemic Control (2013)  Target Ranges:  Prepandial:   less than 140 mg/dL      Peak postprandial:   less than 180 mg/dL (1-2 hours)      Critically ill patients:  140 - 180 mg/dL     Results for AUNDRA, PUNG (MRN [id number]) as of 04/10/2013 12:02  Ref. Range 04/10/2013 07:34 04/10/2013 11:25  Glucose-Capillary Latest Range: 70-99 mg/dL 82 73    **Per records, patient takes the following DM medications at home: Lantus 26 units QHS Humalog (unsure of dose) Metformin 1000 mg bid  **Unsure how much Humalog patient is actually taking at home.  Spoke with patient on telephone and patient was able to tell me that she takes Lantus 26 units at bedtime but was not able to tell me the exact amount of Humalog that she takes.  Patient stated she takes 2 units with breakfast, 5 units with lunch, and 2 units with supper, however, she sounded very unsure or herself and I am not convinced this is the correct dose of Humalog.  **Currently, patient's CBGs are well controlled on Lantus 26 units and Novolog Moderate SSI.    **Will follow and assist daily as needed.  Ambrose Finland RN, MSN, CDE Diabetes Coordinator Inpatient Diabetes Program [phone]

## 2013-04-10 NOTE — Progress Notes (Signed)
VASCULAR LAB PRELIMINARY  PRELIMINARY  PRELIMINARY  PRELIMINARY  Carotid duplex  completed.    Preliminary report:  Bilateral:  1-39% ICA stenosis.  Vertebral artery flow is antegrade.      Mica Releford, RVT 04/10/2013, 9:36 AM

## 2013-04-11 NOTE — Progress Notes (Signed)
Utilization review completed.  

## 2013-04-17 ENCOUNTER — Ambulatory Visit (INDEPENDENT_AMBULATORY_CARE_PROVIDER_SITE_OTHER): Payer: 59 | Admitting: Physician Assistant

## 2013-04-17 ENCOUNTER — Encounter (INDEPENDENT_AMBULATORY_CARE_PROVIDER_SITE_OTHER): Payer: Self-pay

## 2013-04-17 ENCOUNTER — Other Ambulatory Visit: Payer: Self-pay | Admitting: *Deleted

## 2013-04-17 ENCOUNTER — Encounter (INDEPENDENT_AMBULATORY_CARE_PROVIDER_SITE_OTHER): Payer: 59

## 2013-04-17 VITALS — BP 84/60 | HR 104 | Resp 16 | Ht 65.0 in | Wt 169.0 lb

## 2013-04-17 DIAGNOSIS — E119 Type 2 diabetes mellitus without complications: Secondary | ICD-10-CM

## 2013-04-17 DIAGNOSIS — Z4651 Encounter for fitting and adjustment of gastric lap band: Secondary | ICD-10-CM

## 2013-04-17 NOTE — Progress Notes (Signed)
  HISTORY: Amy [name] is a 57 y.o.female who received an 10cm lap-band in April 2007 by Dr. Daphine Deutscher. She comes in with recent discharge from the hospital for suspected TIA vs. Adverse reaction to Wellbutrin. She has been experiencing obstructive symptoms including liquid intolerance since just after seeing Dr. Daphine Deutscher for a fill on 8/12.  VITAL SIGNS: Filed Vitals:   04/17/13 1437  BP: 84/60  Pulse: 104  Resp: 16    PHYSICAL EXAM: Physical exam reveals a very well-appearing 57 y.o.female in no apparent distress Neurologic: Awake, alert, oriented Psych: Bright affect, conversant Respiratory: Breathing even and unlabored. No stridor or wheezing Abdomen: Soft, nontender, nondistended to palpation. Incisions well-healed. No incisional hernias. Port easily palpated. Extremities: Atraumatic, good range of motion.  ASSESMENT: 56 y.o.  female  s/p 10cm lap-band.   PLAN: The patient's port was accessed with a 20G Huber needle without difficulty. Clear fluid was aspirated and 2 mL saline was removed from the port to give a total predicted volume of 2.25 mL. The patient was advised to concentrate on healthy food choices and to avoid slider foods high in fats and carbohydrates. I discussed her case with Dr. Ezzard Standing. If she has further symptoms we asked her to return sooner. Otherwise I'll have her back in two weeks.

## 2013-04-17 NOTE — Patient Instructions (Signed)
Return in two weeks. Focus on good food choices as well as physical activity. Return sooner if you have an increase in hunger, portion sizes or weight. Return also for difficulty swallowing, night cough, reflux.   

## 2013-04-21 ENCOUNTER — Other Ambulatory Visit (INDEPENDENT_AMBULATORY_CARE_PROVIDER_SITE_OTHER): Payer: 59

## 2013-04-21 DIAGNOSIS — E119 Type 2 diabetes mellitus without complications: Secondary | ICD-10-CM

## 2013-04-21 LAB — URINALYSIS
Hgb urine dipstick: NEGATIVE
Ketones, ur: NEGATIVE
Leukocytes, UA: NEGATIVE
Specific Gravity, Urine: 1.02 (ref 1.000–1.030)
Urobilinogen, UA: 0.2 (ref 0.0–1.0)

## 2013-04-21 LAB — COMPREHENSIVE METABOLIC PANEL
ALT: 42 U/L — ABNORMAL HIGH (ref 0–35)
Albumin: 3.7 g/dL (ref 3.5–5.2)
Calcium: 9 mg/dL (ref 8.4–10.5)
GFR: 75.65 mL/min (ref >60.00)
Glucose, Bld: 98 mg/dL (ref 70–99)
Potassium: 3.4 mEq/L — ABNORMAL LOW (ref 3.5–5.1)
Sodium: 137 mEq/L (ref 135–145)

## 2013-04-21 LAB — MICROALBUMIN / CREATININE URINE RATIO: Creatinine,U: 214.7 mg/dL

## 2013-04-23 ENCOUNTER — Ambulatory Visit (INDEPENDENT_AMBULATORY_CARE_PROVIDER_SITE_OTHER): Payer: 59 | Admitting: Endocrinology

## 2013-04-23 ENCOUNTER — Encounter: Payer: Self-pay | Admitting: Endocrinology

## 2013-04-23 VITALS — BP 118/70 | HR 107 | Temp 98.7°F | Resp 12 | Ht 65.0 in | Wt 180.9 lb

## 2013-04-23 DIAGNOSIS — I10 Essential (primary) hypertension: Secondary | ICD-10-CM

## 2013-04-23 DIAGNOSIS — IMO0001 Reserved for inherently not codable concepts without codable children: Secondary | ICD-10-CM

## 2013-04-23 DIAGNOSIS — E785 Hyperlipidemia, unspecified: Secondary | ICD-10-CM

## 2013-04-23 NOTE — Progress Notes (Signed)
Patient ID: Amy [name], female   DOB: 02-02-1956, 57 y.o.   MRN: [id number]  Amy [name] is an 57 y.o. female.   Reason for Appointment: Diabetes follow-up   History of Present Illness   Diagnosis: Type 2 DIABETES MELITUS,   She diabetes treated for several years with insulin and also metformin  She usually his A1c results near normal and is fairly compliant with her basal bolus regimen She continues to be an atypical antipsychotic drugs also More recently has not checked her blood sugars consistently and does have tendency to high readings after supper for the last 2 days She does not know how to explain this She has not done much exercise     Insulin regimen: Humalog breakfast 2-3 units, lunch 6-8; supper 6-8. Lantus insulin 26 units        Proper timing of medications in relation to meals: Yes.         Monitors blood glucose: Once a day.    Glucometer:  Accu-Chek           Blood Glucose readings from meter download: readings before breakfast: 87, 101, after supper recently 171-283 otherwise daytime readings 93-172   Hypoglycemia frequency: Never.          Meals: 3 meals per day.   eggs only; diet ok      Physical activity: exercise: walks 3/7           Dietician visit:  a few years ago          She has had neuropathy from diabetes. Normal urine microalbumin  Lab Results  Component Value Date   HGBA1C 7.3* 04/21/2013     Wt Readings from Last 3 Encounters:  04/23/13 180 lb 14.4 oz (82.056 kg)  04/17/13 169 lb (76.658 kg)  04/09/13 169 lb 12.8 oz (77.021 kg)    Appointment on 04/21/2013  Component Date Value Range Status  . Hemoglobin A1C 04/21/2013 7.3* 4.6 - 6.5 % Final   Glycemic Control Guidelines for People with Diabetes:Non Diabetic:  <6%Goal of Therapy: <7%Additional Action Suggested:  >8%   . Sodium 04/21/2013 137  135 - 145 mEq/L Final  . Potassium 04/21/2013 3.4* 3.5 - 5.1 mEq/L Final  . Chloride 04/21/2013 98  96 - 112 mEq/L Final  . CO2 04/21/2013 32   19 - 32 mEq/L Final  . Glucose, Bld 04/21/2013 98  70 - 99 mg/dL Final  . BUN 40/98/1191 9  6 - 23 mg/dL Final  . Creatinine, Ser 04/21/2013 1.0  0.4 - 1.2 mg/dL Final  . Total Bilirubin 04/21/2013 0.3  0.3 - 1.2 mg/dL Final  . Alkaline Phosphatase 04/21/2013 102  39 - 117 U/L Final  . AST 04/21/2013 35  0 - 37 U/L Final  . ALT 04/21/2013 42* 0 - 35 U/L Final  . Total Protein 04/21/2013 7.2  6.0 - 8.3 g/dL Final  . Albumin 47/82/9562 3.7  3.5 - 5.2 g/dL Final  . Calcium 13/03/6577 9.0  8.4 - 10.5 mg/dL Final  . GFR 46/96/2952 75.65  >60.00 mL/min Final  . Color, Urine 04/21/2013 LT. YELLOW  Yellow;Lt. Yellow Final  . APPearance 04/21/2013 CLEAR  Clear Final  . Specific Gravity, Urine 04/21/2013 1.020  1.000-1.030 Final  . pH 04/21/2013 6.0  5.0 - 8.0 Final  . Total Protein, Urine 04/21/2013 NEGATIVE  Negative Final  . Urine Glucose 04/21/2013 NEGATIVE  Negative Final  . Ketones, ur 04/21/2013 NEGATIVE  Negative Final  . Bilirubin Urine 04/21/2013  NEGATIVE  Negative Final  . Hgb urine dipstick 04/21/2013 NEGATIVE  Negative Final  . Urobilinogen, UA 04/21/2013 0.2  0.0 - 1.0 Final  . Leukocytes, UA 04/21/2013 NEGATIVE  Negative Final  . Nitrite 04/21/2013 NEGATIVE  Negative Final  . Microalb, Ur 04/21/2013 0.5  0.0 - 1.9 mg/dL Final  . Creatinine,U 16/05/9603 214.7   Final  . Microalb Creat Ratio 04/21/2013 0.2  0.0 - 30.0 mg/g Final      Medication List       This list is accurate as of: 04/23/13  4:16 PM.  Always use your most recent med list.               ABILIFY 20 MG tablet  Generic drug:  ARIPiprazole  Take 20 mg by mouth daily.     accu-chek soft touch lancets  Use as instructed dx 250.01     amphetamine-dextroamphetamine 15 MG tablet  Commonly known as:  ADDERALL  Take 15 mg by mouth daily.     aspirin 81 MG tablet  Take 81 mg by mouth daily.     atenolol 25 MG tablet  Commonly known as:  TENORMIN  Take 25 mg by mouth daily.     cetirizine 10 MG tablet   Commonly known as:  ZYRTEC  Take 10 mg by mouth daily as needed for allergies.     ergocalciferol 50000 UNITS capsule  Commonly known as:  VITAMIN D2  Take 50,000 Units by mouth once a week.     esomeprazole 40 MG capsule  Commonly known as:  NEXIUM  Take 40 mg by mouth daily before breakfast.     ezetimibe-simvastatin 10-80 MG per tablet  Commonly known as:  VYTORIN  Take 1 tablet by mouth at bedtime.     FER-IRON PO  Take 1 tablet by mouth daily.     furosemide 80 MG tablet  Commonly known as:  LASIX  Take 80 mg by mouth daily.     gabapentin 300 MG capsule  Commonly known as:  NEURONTIN  Take 600 mg by mouth 3 (three) times daily.     glucose blood test strip  Commonly known as:  ACCU-CHEK SMARTVIEW  Use as instructed, dx code 250.00     imipramine 50 MG tablet  Commonly known as:  TOFRANIL  200 mg.     insulin glargine 100 UNIT/ML injection  Commonly known as:  LANTUS  Inject 0.26 mLs (26 Units total) into the skin at bedtime.     insulin lispro 100 UNIT/ML injection  Commonly known as:  HUMALOG  Inject 1-6 Units into the skin 3 (three) times daily before meals.     metFORMIN 1000 MG tablet  Commonly known as:  GLUCOPHAGE  Take 1,000 mg by mouth 2 (two) times daily with a meal.     NON FORMULARY  at bedtime.     RHINOCORT AQUA 32 MCG/ACT nasal spray  Generic drug:  budesonide  Place 1 spray into the nose daily as needed (congestion). Use as needed     telmisartan 80 MG tablet  Commonly known as:  MICARDIS  Take 80 mg by mouth daily.        Allergies: No Known Allergies  Past Medical History  Diagnosis Date  . DIABETES MELLITUS, TYPE II 03/14/2007  . HYPERTENSION 03/14/2007  . SLEEP APNEA 01/18/2009    CPAP  . OBESITY, MORBID 01/18/2009  . Edema 12/20/2009  . Depression   . Dyslipidemia   . DM type  2 with diabetic peripheral neuropathy     painful  . Osteoarthritis     knees  . CTS (carpal tunnel syndrome)   . Diverticulosis   . Bipolar  disorder   . Vitamin D deficiency   . Anemia     unspecified  . GERD (gastroesophageal reflux disease)   . Transient ischemic attack (TIA) 04/09/2013    Past Surgical History  Procedure Laterality Date  . Laparoscopic gastric banding  2007  . Esophagogastroduodenoscopy  02/03/2004  . Treadmill cardiolite  2002  . Lower arterial  02/03/2004    Family History  Problem Relation Age of Onset  . Heart disease Father   . Breast cancer Sister   . Diabetes Sister   . Colon cancer Neg Hx   . Sarcoidosis Brother     Social History:  reports that she has never smoked. She has never used smokeless tobacco. She reports that she does not drink alcohol or use illicit drugs.  Review of Systems:  She has a history of obesity, previously weight was 250, treated with a LAP-BAND, recently this needed to be loosened   Recent admission to the hospital with TIA or medication side effect, had difficulty with  speech. Normal carotid studies  HYPERTENSION:   This in mild and well controlled with Micardis  HYPERLIPIDEMIA: The lipid abnormality consists of elevated LDL. Has been given Vytorin by PCP Lab Results  Component Value Date   LDLCALC 70 04/10/2013     No history of recent leg edema  Symptoms of neuropathy are fairly well controlled with gabapentin and imipramine     Examination:   BP 118/70  Pulse 107  Temp(Src) 98.7 F (37.1 C)  Resp 12  Ht 5\' 5"  (1.651 m)  Wt 180 lb 14.4 oz (82.056 kg)  BMI 30.1 kg/m2  SpO2 96%  Body mass index is 30.1 kg/(m^2).   ASSESSMENT/ PLAN::   Diabetes type 2   The patient's diabetes control appears to be relatively worse at mostly higher postprandial readings. She is also taking less than the usual amount of coverage for her meals especially supper She has not been exercising consistently and encouraged her to do so with walking She will also work on keeping her diet controlled with lower fat meals, consider followup visit for dietitian  She  will add at least 2 more units to her evening insulin and discussed keeping the postprandial readings at least under 180  NEUROPATHY: She will continue gabapentin  Recent speech difficulty: Review hospital records and neurology consultation does not confirm any vascular event, she will continue low-dose aspirin  Bryceson Grape 04/23/2013, 4:16 PM

## 2013-04-23 NOTE — Patient Instructions (Addendum)
Humalog 2-3 units am, lunch 6-8; supper 8-10      Please check blood sugars at least half the time about 2 hours after any meal and as directed on waking up. Please bring blood sugar monitor to each visit  Increase walking

## 2013-04-24 ENCOUNTER — Encounter (INDEPENDENT_AMBULATORY_CARE_PROVIDER_SITE_OTHER): Payer: 59

## 2013-04-28 DIAGNOSIS — E785 Hyperlipidemia, unspecified: Secondary | ICD-10-CM | POA: Insufficient documentation

## 2013-05-01 ENCOUNTER — Ambulatory Visit (INDEPENDENT_AMBULATORY_CARE_PROVIDER_SITE_OTHER): Payer: 59 | Admitting: Physician Assistant

## 2013-05-01 ENCOUNTER — Encounter (INDEPENDENT_AMBULATORY_CARE_PROVIDER_SITE_OTHER): Payer: Self-pay

## 2013-05-01 VITALS — BP 98/60 | HR 82 | Resp 14 | Ht 65.0 in | Wt 185.0 lb

## 2013-05-01 DIAGNOSIS — Z4651 Encounter for fitting and adjustment of gastric lap band: Secondary | ICD-10-CM

## 2013-05-01 NOTE — Progress Notes (Signed)
  HISTORY: Amy [name] is a 57 y.o.female who received an 10cm lap-band in April 2007 by Dr. Daphine Deutscher. She had fluid removed due to obstruction two weeks ago. She is feeling much better now but has gained 16 lbs. She is hungry and eating more. She'd like a fill today to help continue losing weight.  VITAL SIGNS: Filed Vitals:   05/01/13 1517  BP: 98/60  Pulse: 82  Resp: 14    PHYSICAL EXAM: Physical exam reveals a very well-appearing 57 y.o.female in no apparent distress Neurologic: Awake, alert, oriented Psych: Bright affect, conversant Respiratory: Breathing even and unlabored. No stridor or wheezing Abdomen: Soft, nontender, nondistended to palpation. Incisions well-healed. No incisional hernias. Port easily palpated. Extremities: Atraumatic, good range of motion.  ASSESMENT: 57 y.o.  female  s/p 10cm lap-band.   PLAN: The patient's port was accessed with a 20G Huber needle without difficulty. Clear fluid was aspirated and 1.5 mL saline was added to the port to give a total predicted volume of 3.75 mL. The patient was able to swallow water without difficulty following the procedure and was instructed to take clear liquids for the next 24-48 hours and advance slowly as tolerated.

## 2013-05-01 NOTE — Patient Instructions (Signed)

## 2013-06-03 ENCOUNTER — Encounter: Payer: Self-pay | Admitting: Podiatry

## 2013-06-03 ENCOUNTER — Ambulatory Visit (INDEPENDENT_AMBULATORY_CARE_PROVIDER_SITE_OTHER): Payer: 59 | Admitting: Podiatry

## 2013-06-03 DIAGNOSIS — M2040 Other hammer toe(s) (acquired), unspecified foot: Secondary | ICD-10-CM

## 2013-06-03 DIAGNOSIS — E1169 Type 2 diabetes mellitus with other specified complication: Secondary | ICD-10-CM

## 2013-06-03 NOTE — Progress Notes (Signed)
Patient presents to office to pick up diabetic shoes and insoles. Instructions for wearing were reviewed. Return for follow with Dr. Al Corpus as needed.

## 2013-06-03 NOTE — Patient Instructions (Signed)

## 2013-06-12 ENCOUNTER — Ambulatory Visit (INDEPENDENT_AMBULATORY_CARE_PROVIDER_SITE_OTHER): Payer: 59 | Admitting: Physician Assistant

## 2013-06-12 ENCOUNTER — Encounter (INDEPENDENT_AMBULATORY_CARE_PROVIDER_SITE_OTHER): Payer: Self-pay

## 2013-06-12 VITALS — BP 110/64 | HR 112 | Temp 99.0°F | Resp 16 | Ht 65.0 in | Wt 178.4 lb

## 2013-06-12 DIAGNOSIS — Z4651 Encounter for fitting and adjustment of gastric lap band: Secondary | ICD-10-CM

## 2013-06-12 NOTE — Progress Notes (Signed)
  HISTORY: Amy [name] is a 57 y.o.female who received an 10cm lap-band in April 2007 by Dr. Colin Benton. She comes in with 6. Lbs weight loss since her last visit. She describes feeling less restriction than she's used to. She denies regurgitation or reflux. She wants a fill today to keep her weight down.  VITAL SIGNS: Filed Vitals:   06/12/13 1557  BP: 110/64  Pulse: 112  Temp: 99 F (37.2 C)  Resp: 16    PHYSICAL EXAM: Physical exam reveals a very well-appearing 57 y.o.female in no apparent distress Neurologic: Awake, alert, oriented Psych: Bright affect, conversant Respiratory: Breathing even and unlabored. No stridor or wheezing Abdomen: Soft, nontender, nondistended to palpation. Incisions well-healed. No incisional hernias. Port easily palpated. Extremities: Atraumatic, good range of motion.  ASSESMENT: 57 y.o.  female  s/p 10cm lap-band.   PLAN: As she has a 10cm 4 mL band, I'm concerned about lack of restriction this close to maximum capacity. Therefore the patient's port was accessed with a 20G Huber needle without difficulty. 3.75 mL clear fluid was aspirated and 0.25 mL saline was added to the port to give a total predicted volume of 4 mL. The patient was able to swallow water without difficulty following the procedure and was instructed to take clear liquids for the next 24-48 hours and advance slowly as tolerated.

## 2013-06-12 NOTE — Patient Instructions (Signed)

## 2013-07-03 ENCOUNTER — Other Ambulatory Visit: Payer: Self-pay

## 2013-07-14 ENCOUNTER — Other Ambulatory Visit (INDEPENDENT_AMBULATORY_CARE_PROVIDER_SITE_OTHER): Payer: 59

## 2013-07-14 DIAGNOSIS — IMO0001 Reserved for inherently not codable concepts without codable children: Secondary | ICD-10-CM

## 2013-07-14 LAB — BASIC METABOLIC PANEL
BUN: 6 mg/dL (ref 6–23)
CO2: 30 mEq/L (ref 19–32)
Chloride: 101 mEq/L (ref 96–112)
Creatinine, Ser: 0.8 mg/dL (ref 0.4–1.2)
Glucose, Bld: 120 mg/dL — ABNORMAL HIGH (ref 70–99)

## 2013-07-14 LAB — HEMOGLOBIN A1C: Hgb A1c MFr Bld: 8.9 % — ABNORMAL HIGH (ref 4.6–6.5)

## 2013-07-14 LAB — LIPID PANEL
HDL: 60.5 mg/dL (ref >39.00)
LDL Cholesterol: 63 mg/dL (ref 0–99)
Total CHOL/HDL Ratio: 2
VLDL: 9 mg/dL (ref 0.0–40.0)

## 2013-07-18 ENCOUNTER — Encounter: Payer: Self-pay | Admitting: Endocrinology

## 2013-07-18 ENCOUNTER — Ambulatory Visit (INDEPENDENT_AMBULATORY_CARE_PROVIDER_SITE_OTHER): Payer: 59 | Admitting: Endocrinology

## 2013-07-18 VITALS — BP 134/80 | HR 105 | Temp 98.6°F | Resp 12 | Ht 65.0 in | Wt 186.8 lb

## 2013-07-18 DIAGNOSIS — E1149 Type 2 diabetes mellitus with other diabetic neurological complication: Secondary | ICD-10-CM

## 2013-07-18 DIAGNOSIS — E785 Hyperlipidemia, unspecified: Secondary | ICD-10-CM

## 2013-07-18 DIAGNOSIS — F3289 Other specified depressive episodes: Secondary | ICD-10-CM

## 2013-07-18 DIAGNOSIS — F329 Major depressive disorder, single episode, unspecified: Secondary | ICD-10-CM

## 2013-07-18 DIAGNOSIS — I10 Essential (primary) hypertension: Secondary | ICD-10-CM

## 2013-07-18 NOTE — Progress Notes (Signed)
Patient ID: Amy [name], female   DOB: 11-06-1955, 57 y.o.   MRN: [id number]  Amy [name] is an 56 y.o. female.   Reason for Appointment: Diabetes follow-up   History of Present Illness   Diagnosis: Type 2 DIABETES MELITUS,   Her diabetes has been treated for several years with basal bolus insulin and also metformin She usually has had A1c results near normal and is fairly compliant with her basal bolus regimen She continues to be an atypical antipsychotic drugs also  Not clear why her blood sugars are much worse now with A1c nearly 9% it has been gradually rising earlier this year also Again she has not checked her blood sugars consistently and does have tendency to high readings after lunch and supper periodically However does not have markedly increased readings persistently to explain her A1c Most likely she is not watching her diet consistently since she has gained a significant amount of weight over the last month     Insulin regimen: Humalog breakfast 3 units, lunch 8; supper 8-10. Lantus insulin 26 units        Proper timing of medications in relation to meals: Yes.          Monitors blood glucose: Once a day.    Glucometer:  Accu-Chek           Blood Glucose readings from meter download on 07/18/13: readings before breakfast: 108, midday 94:33, early afternoon 131-187 and evening 39-228 with some readings postprandial and overall average 147 for the last 17 readings in 30 days Hypoglycemia frequency:  none recently.          Meals: 3 meals per day.   eggs only; portions larger    Physical activity: exercise: walks 3/7           Dietician visit:  a few years ago          Complications: She has had neuropathy from diabetes.  Normal urine microalbumin   Wt Readings from Last 3 Encounters:  07/18/13 186 lb 12.8 oz (84.732 kg)  06/12/13 178 lb 6.4 oz (80.922 kg)  05/01/13 185 lb (83.915 kg)   Lab Results  Component Value Date   HGBA1C 8.9* 07/14/2013   HGBA1C 7.3*  04/21/2013   HGBA1C 6.7* 04/09/2013   Lab Results  Component Value Date   MICROALBUR 0.5 04/21/2013   LDLCALC 63 07/14/2013   CREATININE 0.8 07/14/2013    Appointment on 07/14/2013  Component Date Value Range Status  . Sodium 07/14/2013 139  135 - 145 mEq/L Final  . Potassium 07/14/2013 3.6  3.5 - 5.1 mEq/L Final  . Chloride 07/14/2013 101  96 - 112 mEq/L Final  . CO2 07/14/2013 30  19 - 32 mEq/L Final  . Glucose, Bld 07/14/2013 120* 70 - 99 mg/dL Final  . BUN 40/98/1191 6  6 - 23 mg/dL Final  . Creatinine, Ser 07/14/2013 0.8  0.4 - 1.2 mg/dL Final  . Calcium 47/82/9562 9.2  8.4 - 10.5 mg/dL Final  . GFR 13/03/6577 92.85  >60.00 mL/min Final  . Hemoglobin A1C 07/14/2013 8.9* 4.6 - 6.5 % Final   Glycemic Control Guidelines for People with Diabetes:Non Diabetic:  <6%Goal of Therapy: <7%Additional Action Suggested:  >8%   . Cholesterol 07/14/2013 132  0 - 200 mg/dL Final   ATP III Classification       Desirable:  < 200 mg/dL               Borderline High:  200 - 239 mg/dL          High:  > = 469 mg/dL  . Triglycerides 07/14/2013 45.0  0.0 - 149.0 mg/dL Final   Normal:  <629 mg/dLBorderline High:  150 - 199 mg/dL  . HDL 07/14/2013 60.50  >39.00 mg/dL Final  . VLDL 52/84/1324 9.0  0.0 - 40.1 mg/dL Final  . LDL Cholesterol 07/14/2013 63  0 - 99 mg/dL Final  . Total CHOL/HDL Ratio 07/14/2013 2   Final                  Men          Women1/2 Average Risk     3.4          3.3Average Risk          5.0          4.42X Average Risk          9.6          7.13X Average Risk          15.0          11.0                          Medication List       This list is accurate as of: 07/18/13  4:34 PM.  Always use your most recent med list.               ABILIFY 20 MG tablet  Generic drug:  ARIPiprazole  Take 20 mg by mouth daily.     accu-chek soft touch lancets  Use as instructed dx 250.01     amphetamine-dextroamphetamine 15 MG tablet  Commonly known as:  ADDERALL  Take 15 mg by mouth  daily.     aspirin 81 MG tablet  Take 81 mg by mouth daily.     cetirizine 10 MG tablet  Commonly known as:  ZYRTEC  Take 10 mg by mouth daily as needed for allergies.     ergocalciferol 50000 UNITS capsule  Commonly known as:  VITAMIN D2  Take 50,000 Units by mouth once a week.     esomeprazole 40 MG capsule  Commonly known as:  NEXIUM  Take 40 mg by mouth daily before breakfast.     ezetimibe-simvastatin 10-80 MG per tablet  Commonly known as:  VYTORIN  Take 1 tablet by mouth at bedtime.     FER-IRON PO  Take 1 tablet by mouth daily.     furosemide 80 MG tablet  Commonly known as:  LASIX  Take 80 mg by mouth daily.     gabapentin 300 MG capsule  Commonly known as:  NEURONTIN  Take 600 mg by mouth 3 (three) times daily.     glucose blood test strip  Commonly known as:  ACCU-CHEK SMARTVIEW  Use as instructed, dx code 250.00     imipramine 50 MG tablet  Commonly known as:  TOFRANIL  200 mg.     insulin glargine 100 UNIT/ML injection  Commonly known as:  LANTUS  Inject 0.26 mLs (26 Units total) into the skin at bedtime.     insulin lispro 100 UNIT/ML injection  Commonly known as:  HUMALOG  Inject 1-6 Units into the skin 3 (three) times daily before meals.     metFORMIN 1000 MG tablet  Commonly known as:  GLUCOPHAGE  Take 1,000 mg by mouth 2 (two) times daily with a meal.  NON FORMULARY  at bedtime.     RHINOCORT AQUA 32 MCG/ACT nasal spray  Generic drug:  budesonide  Place 1 spray into the nose daily as needed (congestion). Use as needed     telmisartan 80 MG tablet  Commonly known as:  MICARDIS  Take 80 mg by mouth daily.        Allergies: No Known Allergies  Past Medical History  Diagnosis Date  . DIABETES MELLITUS, TYPE II 03/14/2007  . HYPERTENSION 03/14/2007  . SLEEP APNEA 01/18/2009    CPAP  . OBESITY, MORBID 01/18/2009  . Edema 12/20/2009  . Depression   . Dyslipidemia   . DM type 2 with diabetic peripheral neuropathy     painful  .  Osteoarthritis     knees  . CTS (carpal tunnel syndrome)   . Diverticulosis   . Bipolar disorder   . Vitamin D deficiency   . Anemia     unspecified  . GERD (gastroesophageal reflux disease)   . Transient ischemic attack (TIA) 04/09/2013    Past Surgical History  Procedure Laterality Date  . Laparoscopic gastric banding  2007  . Esophagogastroduodenoscopy  02/03/2004  . Treadmill cardiolite  2002  . Lower arterial  02/03/2004    Family History  Problem Relation Age of Onset  . Heart disease Father   . Breast cancer Sister   . Diabetes Sister   . Colon cancer Neg Hx   . Sarcoidosis Brother     Social History:  reports that she has never smoked. She has never used smokeless tobacco. She reports that she does not drink alcohol or use illicit drugs.  Review of Systems:  She has a history of obesity, previously weight was 250, treated with a LAP-BAND, recently this needed to be loosened   HYPERTENSION:   This in mild and well controlled with Micardis  HYPERLIPIDEMIA: The lipid abnormality consists of elevated LDL. Has been given Vytorin by PCP Lab Results  Component Value Date   LDLCALC 63 07/14/2013   No history of recent leg edema  Symptoms of neuropathy are fairly well controlled with gabapentin and imipramine, now also on Cymbalta for her depression  She thinks her Cymbalta that was started recently is making her excessively hungry     Examination:   BP 134/80  Pulse 105  Temp(Src) 98.6 F (37 C)  Resp 12  Ht 5\' 5"  (1.651 m)  Wt 186 lb 12.8 oz (84.732 kg)  BMI 31.09 kg/m2  SpO2 99%  Body mass index is 31.09 kg/(m^2).   ASSESSMENT/ PLAN:   Diabetes type 2   The patient's diabetes control appears to be significantly worse with A1c nearly 9%. Not clear When her blood sugars are consistently high since most of her recent readings are fairly good However she is not checking enough readings after supper She feels that her high sugars related to increased  hunger from her antidepressants She has also gained weight.  Because of her type 2 diabetes and difficulty with control with insulin alone she is a good candidate for a GLP-1 drug Have discussed with the patient the use of GLP-1 drugs and the mechanism of how they work and benefit blood glucose as well as potentially help with weight loss, increase satiety and gastric fullness. Explained possible side effects and safety information . Discussed dosage titration of Victoza  starting with 0.6 mg and increasing to 1.2 mg, timing of injection and use of the flex pen. Patient brochure and co-pay card given  She will also reduce her mealtime coverage by 2 units, may need to consider reducing her Lantus by at least 2 units if morning sugars are trending below 90, discussed this in detail with her She can probably skip her dose for Humalog in the morning since she is taking only 3 units Encouraged her to check more readings after meals and exercise regularly     [facility] 07/18/2013, 4:34 PM

## 2013-07-18 NOTE — Patient Instructions (Signed)
Start VICTOZA injection with the sample pen once daily at the same time of the day preferably at bedtime.  Dial the dose to 0.6 mg for the first week.   You may  experience nausea in the first few days which usually gets better After 1 week increase the dose to 1.2mg  daily if no nausea.  You may inject in the stomach, thigh or arm.    You will feel fullness of the stomach with starting the medication and should try to keep portions of food small.    INSULIN: With starting Victoza may not take Humalog at breakfast Reduce the doses at lunch and supper by 2 units of Humalog In the morning sugar is below 90 reduced the Lantus by 2 units  Try to walk as regularly as possible Avoid high fat foods  Check more readings after supper

## 2013-08-07 ENCOUNTER — Other Ambulatory Visit: Payer: Self-pay | Admitting: *Deleted

## 2013-08-07 MED ORDER — LIRAGLUTIDE 18 MG/3ML ~~LOC~~ SOPN: 1.2000 mg | PEN_INJECTOR | Freq: Every day | SUBCUTANEOUS | Status: DC

## 2013-08-14 ENCOUNTER — Encounter (INDEPENDENT_AMBULATORY_CARE_PROVIDER_SITE_OTHER): Payer: 59

## 2013-08-25 ENCOUNTER — Encounter: Payer: Self-pay | Admitting: Endocrinology

## 2013-08-25 ENCOUNTER — Ambulatory Visit (INDEPENDENT_AMBULATORY_CARE_PROVIDER_SITE_OTHER): Payer: 59 | Admitting: Endocrinology

## 2013-08-25 VITALS — BP 122/74 | Temp 98.2°F | Resp 12 | Ht 65.0 in | Wt 194.2 lb

## 2013-08-25 DIAGNOSIS — I10 Essential (primary) hypertension: Secondary | ICD-10-CM

## 2013-08-25 DIAGNOSIS — E1149 Type 2 diabetes mellitus with other diabetic neurological complication: Secondary | ICD-10-CM

## 2013-08-25 DIAGNOSIS — R609 Edema, unspecified: Secondary | ICD-10-CM

## 2013-08-25 NOTE — Progress Notes (Signed)
Patient ID: Amy [name], female   DOB: 1955/12/05, 57 y.o.   MRN: [id number]  Reason for Appointment: Diabetes follow-up   History of Present Illness   Diagnosis: Type 2 DIABETES MELITUS, date of diagnosis 1993   Her diabetes has been treated for several years with basal bolus insulin and also metformin She usually has had A1c results near normal and is fairly compliant with her basal bolus regimen She continues to be on various atypical antipsychotic drugs also  Recent history: Not clear why her blood sugars were much worse on her last visit with A1c nearly 9% it has been gradually rising previously also. She had been having difficulty with increased appetite with her various antidepressant drugs She was started on VICTOZA in 11/14 when she had titrated dose to 1.2 mg. However this has not helped her satiety and she has gained weight. Also her blood sugars are about the same HYPERGLYCEMIA appears to be occurring mostly midday and afternoon and occasionally late evening She has not checked her fasting readings since blood sugars are usually normal at that time She thinks some of her high readings are related to eating sweets and she has a craving for chocolate Again  she has gained a significant amount of weight over the last month     Insulin regimen: Humalog breakfast 3 units, lunch 6-8; supper 10-12. Lantus insulin 26 units        Proper timing of medications in relation to meals: Yes.          Monitors blood glucose: Once a day.    Glucometer:  Accu-Chek           Blood Glucose readings from meter download   PREMEAL Breakfast Lunch Dinner Bedtime Overall  Glucose range: ?   240   60-175     Mean/median:      149    POST-MEAL PC Breakfast PC Lunch PC Dinner  Glucose range: ?   76, 197   131-228   Mean/median:    160    Hypoglycemia: Had a glucose of 60 at 5 PM on 08/07/13    Meals: 3 meals per day.  her portions have been larger, eating more  sweets Physical activity:  exercise: walks 3/7 days           Dietician visit:  a few years ago          Complications: She has had neuropathy from diabetes.  Normal urine microalbumin   Wt Readings from Last 3 Encounters:  08/25/13 194 lb 3.2 oz (88.089 kg)  07/18/13 186 lb 12.8 oz (84.732 kg)  06/12/13 178 lb 6.4 oz (80.922 kg)   Lab Results  Component Value Date   HGBA1C 8.9* 07/14/2013   HGBA1C 7.3* 04/21/2013   HGBA1C 6.7* 04/09/2013   Lab Results  Component Value Date   MICROALBUR 0.5 04/21/2013   LDLCALC 63 07/14/2013   CREATININE 0.8 07/14/2013    No visits with results within 1 Week(s) from this visit. Latest known visit with results is:  Appointment on 07/14/2013  Component Date Value Range Status  . Sodium 07/14/2013 139  135 - 145 mEq/L Final  . Potassium 07/14/2013 3.6  3.5 - 5.1 mEq/L Final  . Chloride 07/14/2013 101  96 - 112 mEq/L Final  . CO2 07/14/2013 30  19 - 32 mEq/L Final  . Glucose, Bld 07/14/2013 120* 70 - 99 mg/dL Final  . BUN 40/98/1191 6  6 - 23 mg/dL Final  . Creatinine,  Ser 07/14/2013 0.8  0.4 - 1.2 mg/dL Final  . Calcium 09/81/1914 9.2  8.4 - 10.5 mg/dL Final  . GFR 78/29/5621 92.85  >60.00 mL/min Final  . Hemoglobin A1C 07/14/2013 8.9* 4.6 - 6.5 % Final   Glycemic Control Guidelines for People with Diabetes:Non Diabetic:  <6%Goal of Therapy: <7%Additional Action Suggested:  >8%   . Cholesterol 07/14/2013 132  0 - 200 mg/dL Final   ATP III Classification       Desirable:  < 200 mg/dL               Borderline High:  200 - 239 mg/dL          High:  > = 308 mg/dL  . Triglycerides 07/14/2013 45.0  0.0 - 149.0 mg/dL Final   Normal:  <657 mg/dLBorderline High:  150 - 199 mg/dL  . HDL 07/14/2013 60.50  >39.00 mg/dL Final  . VLDL 84/69/6295 9.0  0.0 - 28.4 mg/dL Final  . LDL Cholesterol 07/14/2013 63  0 - 99 mg/dL Final  . Total CHOL/HDL Ratio 07/14/2013 2   Final                  Men          Women1/2 Average Risk     3.4          3.3Average Risk          5.0          4.42X  Average Risk          9.6          7.13X Average Risk          15.0          11.0                          Medication List       This list is accurate as of: 08/25/13 10:52 AM.  Always use your most recent med list.               ABILIFY 20 MG tablet  Generic drug:  ARIPiprazole  Take 20 mg by mouth daily.     accu-chek soft touch lancets  Use as instructed dx 250.01     amphetamine-dextroamphetamine 15 MG tablet  Commonly known as:  ADDERALL  Take 15 mg by mouth daily.     aspirin 81 MG tablet  Take 81 mg by mouth daily.     cetirizine 10 MG tablet  Commonly known as:  ZYRTEC  Take 10 mg by mouth daily as needed for allergies.     ergocalciferol 50000 UNITS capsule  Commonly known as:  VITAMIN D2  Take 50,000 Units by mouth once a week.     esomeprazole 40 MG capsule  Commonly known as:  NEXIUM  Take 40 mg by mouth daily before breakfast.     ezetimibe-simvastatin 10-80 MG per tablet  Commonly known as:  VYTORIN  Take 1 tablet by mouth at bedtime.     FER-IRON PO  Take 1 tablet by mouth daily.     FETZIMA 40 MG Cp24  Generic drug:  Levomilnacipran HCl ER  Take 40 mg by mouth 3 (three) times daily.     furosemide 80 MG tablet  Commonly known as:  LASIX  Take 80 mg by mouth daily.     gabapentin 300 MG capsule  Commonly known as:  NEURONTIN  Take 600  mg by mouth 3 (three) times daily.     glucose blood test strip  Commonly known as:  ACCU-CHEK SMARTVIEW  Use as instructed, dx code 250.00     imipramine 50 MG tablet  Commonly known as:  TOFRANIL  200 mg.     insulin glargine 100 UNIT/ML injection  Commonly known as:  LANTUS  Inject 0.26 mLs (26 Units total) into the skin at bedtime.     insulin lispro 100 UNIT/ML injection  Commonly known as:  HUMALOG  Inject 1-6 Units into the skin 3 (three) times daily before meals.     Liraglutide 18 MG/3ML Sopn  Inject 1.2 mg into the skin daily.     metFORMIN 1000 MG tablet  Commonly known as:   GLUCOPHAGE  Take 1,000 mg by mouth 2 (two) times daily with a meal.     NON FORMULARY  at bedtime.     QUEtiapine 50 MG tablet  Commonly known as:  SEROQUEL  Take 50 mg by mouth at bedtime.     RHINOCORT AQUA 32 MCG/ACT nasal spray  Generic drug:  budesonide  Place 1 spray into the nose daily as needed (congestion). Use as needed     telmisartan 80 MG tablet  Commonly known as:  MICARDIS  Take 80 mg by mouth daily.        Allergies: No Known Allergies  Past Medical History  Diagnosis Date  . DIABETES MELLITUS, TYPE II 03/14/2007  . HYPERTENSION 03/14/2007  . SLEEP APNEA 01/18/2009    CPAP  . OBESITY, MORBID 01/18/2009  . Edema 12/20/2009  . Depression   . Dyslipidemia   . DM type 2 with diabetic peripheral neuropathy     painful  . Osteoarthritis     knees  . CTS (carpal tunnel syndrome)   . Diverticulosis   . Bipolar disorder   . Vitamin D deficiency   . Anemia     unspecified  . GERD (gastroesophageal reflux disease)   . Transient ischemic attack (TIA) 04/09/2013    Past Surgical History  Procedure Laterality Date  . Laparoscopic gastric banding  2007  . Esophagogastroduodenoscopy  02/03/2004  . Treadmill cardiolite  2002  . Lower arterial  02/03/2004    Family History  Problem Relation Age of Onset  . Heart disease Father   . Breast cancer Sister   . Diabetes Sister   . Colon cancer Neg Hx   . Sarcoidosis Brother     Social History:  reports that she has never smoked. She has never used smokeless tobacco. She reports that she does not drink alcohol or use illicit drugs.  Review of Systems:  She has a history of obesity, previously weight was 250, treated with a LAP-BAND,  followup visit will be in 1/15. Currently not benefiting from this and appetite is not controlled  HYPERTENSION:   This in mild and well controlled with Micardis and Lasix  HYPERLIPIDEMIA: The lipid abnormality consists of elevated LDL. Has been given Vytorin by PCP Lab Results   Component Value Date   LDLCALC 63 07/14/2013   No history of recent leg edema, still taking 80 mg Lasix   Symptoms of neuropathy are fairly well controlled with gabapentin and imipramine; currently not on Cymbalta which was stopped  She is now on new medications for her depression      Examination:   BP 122/74  Temp(Src) 98.2 F (36.8 C)  Resp 12  Ht 5\' 5"  (1.651 m)  Wt 194 lb  3.2 oz (88.089 kg)  BMI 32.32 kg/m2  Body mass index is 32.32 kg/(m^2).   No pedal edema present  ASSESSMENT/ PLAN:   Diabetes type 2   The patient's diabetes control is somewhat difficult to assess since she is checking her readings mostly around supper time Does have high readings the last few days probably from eating more sweets over the holidays A1c not due at this time She has not responded to Victoza since she has gained weight and her insulin requirement is still about the same  For now will stop the Victoza and start her on Invokana 100 mg. Discussed how the this will improve her blood sugar patterns and help with weight loss and blood pressure control. Discussed possible side effects, dosage and timing of medication  Encouraged her to check more readings in the morning also and after breakfast or lunch  She will start cutting back her all her insulin doses by 2-3 units as her blood sugars improve with Invokana She will need to cut back her Lantus by 2-4 units if blood sugars are below 90 in the morning She will also need to reduce her Lasix to 40 mg to avoid volume the patient with Invokana Also she will need to discuss weight gain and increased appetite with her psychiatrist Followup in 6 weeks with A1c  Counseling time over 50% of today's 25 minute visit  Ninetta Adelstein 08/25/2013, 10:52 AM

## 2013-08-25 NOTE — Patient Instructions (Addendum)
Stop Victoza  Invokana 100mg  in am daily; may need reduce all Insulin doses  Reduce Lasix to 1/2 daily   Please check blood sugars at least half the time about 2 hours after any meal and as directed on waking up. Please bring blood sugar monitor to each visit

## 2013-08-26 ENCOUNTER — Other Ambulatory Visit: Payer: Self-pay | Admitting: *Deleted

## 2013-08-26 MED ORDER — INSULIN GLARGINE 100 UNIT/ML SOLOSTAR PEN: 26.0000 [IU] | PEN_INJECTOR | Freq: Every day | SUBCUTANEOUS | Status: DC

## 2013-08-26 MED ORDER — GLUCOSE BLOOD VI STRP: ORAL_STRIP | Status: DC

## 2013-08-26 MED ORDER — GABAPENTIN 300 MG PO CAPS: 600.0000 mg | ORAL_CAPSULE | Freq: Three times a day (TID) | ORAL | Status: DC

## 2013-08-26 MED ORDER — METFORMIN HCL 1000 MG PO TABS: 1000.0000 mg | ORAL_TABLET | Freq: Two times a day (BID) | ORAL | Status: DC

## 2013-08-26 MED ORDER — ACCU-CHEK SOFT TOUCH LANCETS MISC: Status: DC

## 2013-08-26 MED ORDER — CANAGLIFLOZIN 100 MG PO TABS: 100.0000 mg | ORAL_TABLET | Freq: Every day | ORAL | Status: DC

## 2013-08-26 MED ORDER — INSULIN LISPRO 100 UNIT/ML (KWIKPEN): PEN_INJECTOR | SUBCUTANEOUS | Status: DC

## 2013-09-01 ENCOUNTER — Other Ambulatory Visit: Payer: Self-pay | Admitting: *Deleted

## 2013-09-02 ENCOUNTER — Other Ambulatory Visit: Payer: Self-pay | Admitting: *Deleted

## 2013-09-02 MED ORDER — FLUCONAZOLE 150 MG PO TABS: 150.0000 mg | ORAL_TABLET | Freq: Once | ORAL | Status: DC

## 2013-09-03 ENCOUNTER — Other Ambulatory Visit: Payer: Self-pay | Admitting: *Deleted

## 2013-09-03 ENCOUNTER — Ambulatory Visit (INDEPENDENT_AMBULATORY_CARE_PROVIDER_SITE_OTHER): Payer: 59 | Admitting: Endocrinology

## 2013-09-03 ENCOUNTER — Encounter: Payer: Self-pay | Admitting: Endocrinology

## 2013-09-03 VITALS — BP 114/68 | HR 100 | Temp 98.3°F | Resp 14 | Ht 65.0 in | Wt 187.1 lb

## 2013-09-03 DIAGNOSIS — B370 Candidal stomatitis: Secondary | ICD-10-CM

## 2013-09-03 DIAGNOSIS — E1149 Type 2 diabetes mellitus with other diabetic neurological complication: Secondary | ICD-10-CM

## 2013-09-03 MED ORDER — INSULIN GLARGINE 100 UNIT/ML SOLOSTAR PEN: 26.0000 [IU] | PEN_INJECTOR | Freq: Every day | SUBCUTANEOUS | Status: DC

## 2013-09-03 NOTE — Patient Instructions (Addendum)
Take 1/2 daily of Invokana for 5 days then go to 1 daily  Reduce Lantus 2 units if am sugar < 80

## 2013-09-03 NOTE — Progress Notes (Signed)
Patient ID: Amy [name], female   DOB: Apr 06, 1956, 58 y.o.   MRN: [id number]  Reason for Appointment: Thrush  History of Present Illness   Diagnosis: Type 2 DIABETES MELITUS, date of diagnosis 1993   She was seen on 08/25/13 because of poorly controlled blood sugars She was given Invokana samples 100 mg With this she had lost weight and her blood sugars appear to be improved However since the day after she started this she was having vaginal yeast infection symptoms and a couple of days later who is having sore and swollen tongue with some sores on it. She was given Diflucan yesterday and her vaginal candida symptoms are better. She still has some soreness of her tongue Also continues to have some dry mouth which is not new She was told to stop her Victoza since it was not beneficial but she is still taking it She thinks her cravings for sweets are better now     Insulin regimen: Humalog breakfast 3 units, lunch 6-8; supper 10-12. Lantus insulin 26 units        Proper timing of medications in relation to meals: Yes.          Monitors blood glucose: Once a day.    Glucometer:  Accu-Chek           Blood Glucose readings from review recently: 117-165  14 day average: 158; 7 day average: 140   Wt Readings from Last 3 Encounters:  09/03/13 187 lb 1.6 oz (84.868 kg)  08/25/13 194 lb 3.2 oz (88.089 kg)  07/18/13 186 lb 12.8 oz (84.732 kg)   Lab Results  Component Value Date   HGBA1C 8.9* 07/14/2013   HGBA1C 7.3* 04/21/2013   HGBA1C 6.7* 04/09/2013   Lab Results  Component Value Date   MICROALBUR 0.5 04/21/2013   LDLCALC 63 07/14/2013   CREATININE 0.8 07/14/2013    No visits with results within 1 Week(s) from this visit. Latest known visit with results is:  Appointment on 07/14/2013  Component Date Value Range Status  . Sodium 07/14/2013 139  135 - 145 mEq/L Final  . Potassium 07/14/2013 3.6  3.5 - 5.1 mEq/L Final  . Chloride 07/14/2013 101  96 - 112 mEq/L Final  . CO2  07/14/2013 30  19 - 32 mEq/L Final  . Glucose, Bld 07/14/2013 120* 70 - 99 mg/dL Final  . BUN 40/98/1191 6  6 - 23 mg/dL Final  . Creatinine, Ser 07/14/2013 0.8  0.4 - 1.2 mg/dL Final  . Calcium 47/82/9562 9.2  8.4 - 10.5 mg/dL Final  . GFR 13/03/6577 92.85  >60.00 mL/min Final  . Hemoglobin A1C 07/14/2013 8.9* 4.6 - 6.5 % Final   Glycemic Control Guidelines for People with Diabetes:Non Diabetic:  <6%Goal of Therapy: <7%Additional Action Suggested:  >8%   . Cholesterol 07/14/2013 132  0 - 200 mg/dL Final   ATP III Classification       Desirable:  < 200 mg/dL               Borderline High:  200 - 239 mg/dL          High:  > = 469 mg/dL  . Triglycerides 07/14/2013 45.0  0.0 - 149.0 mg/dL Final   Normal:  <629 mg/dLBorderline High:  150 - 199 mg/dL  . HDL 07/14/2013 60.50  >39.00 mg/dL Final  . VLDL 52/84/1324 9.0  0.0 - 40.1 mg/dL Final  . LDL Cholesterol 07/14/2013 63  0 - 99 mg/dL Final  .  Total CHOL/HDL Ratio 07/14/2013 2   Final                  Men          Women1/2 Average Risk     3.4          3.3Average Risk          5.0          4.42X Average Risk          9.6          7.13X Average Risk          15.0          11.0                          Medication List       This list is accurate as of: 09/03/13  9:01 AM.  Always use your most recent med list.               ABILIFY 20 MG tablet  Generic drug:  ARIPiprazole  Take 20 mg by mouth daily.     accu-chek soft touch lancets  Use as instructed to check blood sugars daily dx 250.01     amphetamine-dextroamphetamine 15 MG tablet  Commonly known as:  ADDERALL  Take 15 mg by mouth daily.     aspirin 81 MG tablet  Take 81 mg by mouth daily.     Canagliflozin 100 MG Tabs  Commonly known as:  INVOKANA  Take 1 tablet (100 mg total) by mouth daily.     cetirizine 10 MG tablet  Commonly known as:  ZYRTEC  Take 10 mg by mouth daily as needed for allergies.     Clobetasol Propionate 0.05 % lotion     DULoxetine 60 MG capsule   Commonly known as:  CYMBALTA     ergocalciferol 50000 UNITS capsule  Commonly known as:  VITAMIN D2  Take 50,000 Units by mouth once a week.     esomeprazole 40 MG capsule  Commonly known as:  NEXIUM  Take 40 mg by mouth daily before breakfast.     ezetimibe-simvastatin 10-80 MG per tablet  Commonly known as:  VYTORIN  Take 1 tablet by mouth at bedtime.     FER-IRON PO  Take 1 tablet by mouth daily.     FETZIMA 40 MG Cp24  Generic drug:  Levomilnacipran HCl ER  Take 40 mg by mouth 3 (three) times daily.     fluconazole 150 MG tablet  Commonly known as:  DIFLUCAN  Take 1 tablet (150 mg total) by mouth once.     FLUOCINOLONE ACETONIDE SCALP 0.01 % Oil     furosemide 80 MG tablet  Commonly known as:  LASIX  Take 80 mg by mouth daily.     gabapentin 300 MG capsule  Commonly known as:  NEURONTIN  Take 2 capsules (600 mg total) by mouth 3 (three) times daily.     glucose blood test strip  Commonly known as:  ACCU-CHEK SMARTVIEW  Use as instructed, dx code 250.00     imipramine 50 MG tablet  Commonly known as:  TOFRANIL  200 mg.     Insulin Glargine 100 UNIT/ML Solostar Pen  Commonly known as:  LANTUS SOLOSTAR  Inject 26 Units into the skin at bedtime.     insulin lispro 100 UNIT/ML KiwkPen  Commonly known as:  Catalina LungerHUMALOG KWIKPEN  Max  18 units per day (1-6 units per meal)     KLOR-CON M20 20 MEQ tablet  Generic drug:  potassium chloride SA     Liraglutide 18 MG/3ML Sopn  Inject 1.2 mg into the skin daily.     metFORMIN 1000 MG tablet  Commonly known as:  GLUCOPHAGE  Take 1 tablet (1,000 mg total) by mouth 2 (two) times daily with a meal.     NON FORMULARY  at bedtime.     NUQUIN HP 4 % Crea     QUEtiapine 50 MG tablet  Commonly known as:  SEROQUEL  Take 50 mg by mouth at bedtime.     RHINOCORT AQUA 32 MCG/ACT nasal spray  Generic drug:  budesonide  Place 1 spray into the nose daily as needed (congestion). Use as needed     telmisartan 80 MG tablet   Commonly known as:  MICARDIS  Take 80 mg by mouth daily.     triamcinolone cream 0.1 %  Commonly known as:  KENALOG        Allergies: No Known Allergies  Past Medical History  Diagnosis Date  . DIABETES MELLITUS, TYPE II 03/14/2007  . HYPERTENSION 03/14/2007  . SLEEP APNEA 01/18/2009    CPAP  . OBESITY, MORBID 01/18/2009  . Edema 12/20/2009  . Depression   . Dyslipidemia   . DM type 2 with diabetic peripheral neuropathy     painful  . Osteoarthritis     knees  . CTS (carpal tunnel syndrome)   . Diverticulosis   . Bipolar disorder   . Vitamin D deficiency   . Anemia     unspecified  . GERD (gastroesophageal reflux disease)   . Transient ischemic attack (TIA) 04/09/2013    Past Surgical History  Procedure Laterality Date  . Laparoscopic gastric banding  2007  . Esophagogastroduodenoscopy  02/03/2004  . Treadmill cardiolite  2002  . Lower arterial  02/03/2004    Family History  Problem Relation Age of Onset  . Heart disease Father   . Breast cancer Sister   . Diabetes Sister   . Colon cancer Neg Hx   . Sarcoidosis Brother     Social History:  reports that she has never smoked. She has never used smokeless tobacco. She reports that she does not drink alcohol or use illicit drugs.  Review of Systems:  She has a history of obesity, previously weight was 250, treated with a LAP-BAND,  followup visit will be in 1/15. Currently not benefiting from this and appetite is not controlled  HYPERTENSION:   This in mild and well controlled with Micardis and Lasix  HYPERLIPIDEMIA: The lipid abnormality consists of elevated LDL. Has been given Vytorin by PCP Lab Results  Component Value Date   LDLCALC 63 07/14/2013   No history of recent leg edema, was told to reduce Lasix to 40 mg      Examination:   BP 114/68  Pulse 100  Temp(Src) 98.3 F (36.8 C)  Resp 14  Ht 5\' 5"  (1.651 m)  Wt 187 lb 1.6 oz (84.868 kg)  BMI 31.14 kg/m2  SpO2 96%  Body mass index is 31.14  kg/(m^2).   No ulcerations or lesions on tongue, mucosa is somewhat dry No oral lesions  ASSESSMENT/ PLAN:   Diabetes type 2   The patient's diabetes control is appearing to be significantly better with starting Invokana Also she is watching her diet better and has lost weight Since previously she had not responded to Victoza  she can stop this for now She has had candidiasis from [facility] which is symptomatically better with Diflucan  For now she can continue Invokana but try only half a tablet for the next 5 days Discussed needing to reduce Lantus if morning sugars are below 80 Followup next month as scheduled  Denika Krone 09/03/2013, 9:01 AM

## 2013-09-05 ENCOUNTER — Ambulatory Visit (INDEPENDENT_AMBULATORY_CARE_PROVIDER_SITE_OTHER): Payer: 59 | Admitting: Surgery

## 2013-09-05 VITALS — BP 140/78 | HR 88 | Temp 97.4°F | Resp 18 | Ht 65.0 in | Wt 188.5 lb

## 2013-09-05 DIAGNOSIS — Z4651 Encounter for fitting and adjustment of gastric lap band: Secondary | ICD-10-CM

## 2013-09-05 NOTE — Patient Instructions (Signed)

## 2013-09-05 NOTE — Progress Notes (Signed)
Lapband Fill Encounter Problem List:   Patient Active Problem List   Diagnosis Date Noted  . Lapband Vagotomy Study Pt May 2007 with 10 cm lapband 08/04/2011    Priority: High  . Other hammer toe (acquired) 06/03/2013  . Other and unspecified hyperlipidemia 04/28/2013  . TIA (transient ischemic attack) 04/09/2013  . ESOPHAGEAL REFLUX 10/10/2010  . HEMATEMESIS 10/10/2010  . DYSPHAGIA UNSPECIFIED 10/10/2010  . EDEMA 12/20/2009  . OBESITY, MORBID 01/18/2009  . SLEEP APNEA 01/18/2009  . NUMBNESS 04/20/2008  . Type II or unspecified type diabetes mellitus with neurological manifestations, uncontrolled(250.62) 03/14/2007  . DEPRESSION 03/14/2007  . HYPERTENSION 03/14/2007  . ALLERGIC RHINITIS 03/14/2007    Baxter Kail Body mass index is 31.37 kg/(m^2). Weight loss since surgery  53  Having regurgitation?:  no  Feel that they need a fill?  yes  Nocturnal reflux?  no  Amount of fill  +0.25     Instructions given and weight loss goals discussed.    I went ahead and added to what she had in her band.  She was able to drink ok.  Will get back to see Jonni Sanger in 3 months.    Matt B. Hassell Done, MD, FACS

## 2013-10-06 ENCOUNTER — Encounter: Payer: Self-pay | Admitting: Endocrinology

## 2013-10-06 ENCOUNTER — Ambulatory Visit (INDEPENDENT_AMBULATORY_CARE_PROVIDER_SITE_OTHER): Payer: 59 | Admitting: Endocrinology

## 2013-10-06 VITALS — BP 114/78 | HR 84 | Temp 98.2°F | Resp 16 | Ht 65.0 in | Wt 183.8 lb

## 2013-10-06 DIAGNOSIS — E1149 Type 2 diabetes mellitus with other diabetic neurological complication: Secondary | ICD-10-CM

## 2013-10-06 DIAGNOSIS — I10 Essential (primary) hypertension: Secondary | ICD-10-CM

## 2013-10-06 NOTE — Progress Notes (Signed)
Patient ID: Amy [name], female   DOB: 11-27-1955, 58 y.o.   MRN: [id number]  Reason for Appointment:  Followup of diabetes  History of Present Illness   Diagnosis: Type 2 DIABETES MELITUS, date of diagnosis 1993   Prior history: Her diabetes has been treated for several years with basal bolus insulin and also metformin She usually has had A1c results near normal and is fairly compliant with her basal bolus regimen She continues to be on various atypical antipsychotic drugs also However her blood sugars were poorly controlled and 03/2013  Recent history:   She had been having difficulty with increased appetite with her various antidepressant drugs She was tried on Victoza in 11/14  However this did not her satiety, glucose control or weight. She was then tried on Invokana in 12/14 and her blood sugars appeared to improve with this She did however get some Candida vaginitis which was treated with Diflucan Currently she is not having any issues with the candidiasis Victoza was stopped on her last visit because of lack of benefit Insulin regimen: Humalog breakfast 3 units, lunch 6-8; supper 10-12. Lantus insulin 26 units         HYPERGLYCEMIA: She is having sporadic high readings in the afternoon and evening but she cannot explain why. She thinks her diet is relatively good Not checking many readings after meals especially supper Hypoglycemia: Occasional 60 is in the mornings     Proper timing of medications in relation to meals: Yes.             Insulin regimen: Humalog breakfast 3 units, lunch 6-10; supper 10-12. Lantus insulin 26 units        Proper timing of medications in relation to meals: Yes.          Monitors blood glucose: Once a day.    Glucometer:  Accu-Chek           Blood Glucose readings   PREMEAL Breakfast Lunch Dinner Bedtime Overall  Glucose range:  60-130    120, 222     Mean/median: 88    141   POST-MEAL PC Breakfast PC Lunch PC Dinner  Glucose range: ?   249    132-235   Mean/median:    181    Diet: Controlling sleeps better but still likes to eat cereal at bedtime without any insulin coverage Exercise regimen: Indoor bike 15 min  Wt Readings from Last 3 Encounters:  10/06/13 183 lb 12.8 oz (83.371 kg)  09/05/13 188 lb 8 oz (85.503 kg)  09/03/13 187 lb 1.6 oz (84.868 kg)   Lab Results  Component Value Date   HGBA1C 8.9* 07/14/2013   HGBA1C 7.3* 04/21/2013   HGBA1C 6.7* 04/09/2013   Lab Results  Component Value Date   MICROALBUR 0.5 04/21/2013   LDLCALC 63 07/14/2013   CREATININE 0.8 07/14/2013    No visits with results within 1 Week(s) from this visit. Latest known visit with results is:  Appointment on 07/14/2013  Component Date Value Range Status  . Sodium 07/14/2013 139  135 - 145 mEq/L Final  . Potassium 07/14/2013 3.6  3.5 - 5.1 mEq/L Final  . Chloride 07/14/2013 101  96 - 112 mEq/L Final  . CO2 07/14/2013 30  19 - 32 mEq/L Final  . Glucose, Bld 07/14/2013 120* 70 - 99 mg/dL Final  . BUN 07/14/2013 6  6 - 23 mg/dL Final  . Creatinine, Ser 07/14/2013 0.8  0.4 - 1.2 mg/dL Final  . Calcium 07/14/2013  9.2  8.4 - 10.5 mg/dL Final  . GFR 16/05/9603 92.85  >60.00 mL/min Final  . Hemoglobin A1C 07/14/2013 8.9* 4.6 - 6.5 % Final   Glycemic Control Guidelines for People with Diabetes:Non Diabetic:  <6%Goal of Therapy: <7%Additional Action Suggested:  >8%   . Cholesterol 07/14/2013 132  0 - 200 mg/dL Final   ATP III Classification       Desirable:  < 200 mg/dL               Borderline High:  200 - 239 mg/dL          High:  > = 540 mg/dL  . Triglycerides 07/14/2013 45.0  0.0 - 149.0 mg/dL Final   Normal:  <981 mg/dLBorderline High:  150 - 199 mg/dL  . HDL 07/14/2013 60.50  >39.00 mg/dL Final  . VLDL 19/14/7829 9.0  0.0 - 56.2 mg/dL Final  . LDL Cholesterol 07/14/2013 63  0 - 99 mg/dL Final  . Total CHOL/HDL Ratio 07/14/2013 2   Final                  Men          Women1/2 Average Risk     3.4          3.3Average Risk          5.0           4.42X Average Risk          9.6          7.13X Average Risk          15.0          11.0                          Medication List       This list is accurate as of: 10/06/13 10:59 AM.  Always use your most recent med list.               accu-chek soft touch lancets  Use as instructed to check blood sugars daily dx 250.01     amphetamine-dextroamphetamine 15 MG tablet  Commonly known as:  ADDERALL  Take 15 mg by mouth daily.     aspirin 81 MG tablet  Take 81 mg by mouth daily.     Canagliflozin 100 MG Tabs  Commonly known as:  INVOKANA  Take 1 tablet (100 mg total) by mouth daily.     cetirizine 10 MG tablet  Commonly known as:  ZYRTEC  Take 10 mg by mouth daily as needed for allergies.     Clobetasol Propionate 0.05 % lotion     ergocalciferol 50000 UNITS capsule  Commonly known as:  VITAMIN D2  Take 50,000 Units by mouth once a week.     esomeprazole 40 MG capsule  Commonly known as:  NEXIUM  Take 40 mg by mouth daily before breakfast.     ezetimibe-simvastatin 10-80 MG per tablet  Commonly known as:  VYTORIN  Take 1 tablet by mouth at bedtime.     FER-IRON PO  Take 1 tablet by mouth daily.     FETZIMA 40 MG Cp24  Generic drug:  Levomilnacipran HCl ER  Take 40 mg by mouth 3 (three) times daily.     FLUOCINOLONE ACETONIDE SCALP 0.01 % Oil     furosemide 80 MG tablet  Commonly known as:  LASIX  Take 80 mg by mouth daily.  gabapentin 300 MG capsule  Commonly known as:  NEURONTIN  Take 2 capsules (600 mg total) by mouth 3 (three) times daily.     glucose blood test strip  Commonly known as:  ACCU-CHEK SMARTVIEW  Use as instructed, dx code 250.00     imipramine 50 MG tablet  Commonly known as:  TOFRANIL  200 mg.     Insulin Glargine 100 UNIT/ML Solostar Pen  Commonly known as:  LANTUS SOLOSTAR  Inject 26 Units into the skin at bedtime.     insulin lispro 100 UNIT/ML KiwkPen  Commonly known as:  HUMALOG  6-10 Units. Max 18 units per day (1-6  units per meal)     KLOR-CON M20 20 MEQ tablet  Generic drug:  potassium chloride SA     metFORMIN 1000 MG tablet  Commonly known as:  GLUCOPHAGE  Take 1 tablet (1,000 mg total) by mouth 2 (two) times daily with a meal.     NON FORMULARY  at bedtime.     NUQUIN HP 4 % Crea     penicillin v potassium 500 MG tablet  Commonly known as:  VEETID     QUEtiapine 50 MG tablet  Commonly known as:  SEROQUEL  Take 50 mg by mouth at bedtime.     RHINOCORT AQUA 32 MCG/ACT nasal spray  Generic drug:  budesonide  Place 1 spray into the nose daily as needed (congestion). Use as needed     telmisartan 80 MG tablet  Commonly known as:  MICARDIS  Take 80 mg by mouth daily.     triamcinolone cream 0.1 %  Commonly known as:  KENALOG        Allergies: No Known Allergies  Past Medical History  Diagnosis Date  . DIABETES MELLITUS, TYPE II 03/14/2007  . HYPERTENSION 03/14/2007  . SLEEP APNEA 01/18/2009    CPAP  . OBESITY, MORBID 01/18/2009  . Edema 12/20/2009  . Depression   . Dyslipidemia   . DM type 2 with diabetic peripheral neuropathy     painful  . Osteoarthritis     knees  . CTS (carpal tunnel syndrome)   . Diverticulosis   . Bipolar disorder   . Vitamin D deficiency   . Anemia     unspecified  . GERD (gastroesophageal reflux disease)   . Transient ischemic attack (TIA) 04/09/2013    Past Surgical History  Procedure Laterality Date  . Laparoscopic gastric banding  2007  . Esophagogastroduodenoscopy  02/03/2004  . Treadmill cardiolite  2002  . Lower arterial  02/03/2004    Family History  Problem Relation Age of Onset  . Heart disease Father   . Breast cancer Sister   . Diabetes Sister   . Colon cancer Neg Hx   . Sarcoidosis Brother     Social History:  reports that she has never smoked. She has never used smokeless tobacco. She reports that she does not drink alcohol or use illicit drugs.  Review of Systems:  She has a history of obesity, previously weight  was 250, treated with a LAP-BAND.   HYPERTENSION:   This in mild and well controlled with Micardis and Lasix  HYPERLIPIDEMIA: The lipid abnormality consists of elevated LDL. Has been given Vytorin by PCP Lab Results  Component Value Date   LDLCALC 63 07/14/2013   No history of recent leg edema, currently on 40 mg Lasix  She has vitamin D deficiency  Has only minimal symptoms of neuropathy recently  Examination:   BP 114/78  Pulse 84  Temp(Src) 98.2 F (36.8 C)  Resp 16  Ht 5\' 5"  (1.651 m)  Wt 183 lb 12.8 oz (83.371 kg)  BMI 30.59 kg/m2  SpO2 98%  Body mass index is 30.59 kg/(m^2).    ASSESSMENT/ PLAN:   Diabetes type 2   The patient's diabetes control is appearing to be overall better with  Invokana which she is tolerating without any recurrence of candidiasis. She will continue 100 mg for now Also she is watching her diet better and has lost weight Not clear why she has occasional high readings after lunch or supper, likely to be from higher carbohydrate intake and insufficient mealtime coverage Recommendations made today:  Humalog at breakfast 3 units, lunch 10-12 units; supper 10-12. 4 units at bedtime for cereal  Reduce Lantus insulin to 24 units and another 2 units if fasting readings are low normal  Avoid high carbohydrate meals and snacks  Keep a diary of foods eaten along with pre-and post prandial blood sugars and insulin doses to review with nurse educator. She needs more guidance on mealtime insulin adjustment  Continue to increase exercise  HYPERTENSION: Well controlled. Also having less requirement for Lasix while taking Invokana    Counseling time over 50% of today's 25 minute visit  Amy [name] 10/06/2013, 10:59 AM

## 2013-10-06 NOTE — Patient Instructions (Signed)
Humalog breakfast 3 units, lunch 10-12 ; supper 10-12. 4 units at bedtime for cereal Lantus insulin 24 units     Please check blood sugars at least half the time about 2 hours after any meal and as directed on waking up.  Please bring blood sugar monitor to each visit  Insulin/food 3 day record

## 2013-10-10 ENCOUNTER — Other Ambulatory Visit: Payer: Self-pay | Admitting: *Deleted

## 2013-10-15 ENCOUNTER — Encounter: Payer: Self-pay | Admitting: Gastroenterology

## 2013-10-20 ENCOUNTER — Ambulatory Visit (AMBULATORY_SURGERY_CENTER): Payer: Self-pay | Admitting: *Deleted

## 2013-10-20 VITALS — Ht 65.0 in | Wt 186.4 lb

## 2013-10-20 DIAGNOSIS — Z1211 Encounter for screening for malignant neoplasm of colon: Secondary | ICD-10-CM

## 2013-10-20 MED ORDER — NA SULFATE-K SULFATE-MG SULF 17.5-3.13-1.6 GM/177ML PO SOLN: ORAL | Status: DC

## 2013-10-20 NOTE — Progress Notes (Signed)
Patient denies any allergies to eggs or soy. Patient denies any problems with anesthesia.  

## 2013-10-27 ENCOUNTER — Ambulatory Visit (AMBULATORY_SURGERY_CENTER): Payer: 59 | Admitting: Gastroenterology

## 2013-10-27 ENCOUNTER — Encounter: Payer: Self-pay | Admitting: Gastroenterology

## 2013-10-27 VITALS — BP 110/64 | HR 74 | Temp 97.8°F | Resp 16 | Ht 65.0 in | Wt 186.0 lb

## 2013-10-27 DIAGNOSIS — Z1211 Encounter for screening for malignant neoplasm of colon: Secondary | ICD-10-CM

## 2013-10-27 HISTORY — PX: COLONOSCOPY WITH PROPOFOL: SHX5780

## 2013-10-27 LAB — GLUCOSE, CAPILLARY
GLUCOSE-CAPILLARY: 102 mg/dL — AB (ref 70–99)
Glucose-Capillary: 104 mg/dL — ABNORMAL HIGH (ref 70–99)

## 2013-10-27 MED ORDER — SODIUM CHLORIDE 0.9 % IV SOLN: 500.0000 mL | INTRAVENOUS | Status: DC

## 2013-10-27 NOTE — Progress Notes (Signed)
Report to pacu rn, vss, bbs=clear 

## 2013-10-27 NOTE — Patient Instructions (Signed)
Impressions/recommendations:  Normal colon  Repeat colonoscopy in 10 years.  YOU HAD AN ENDOSCOPIC PROCEDURE TODAY AT [facility]: Refer to the procedure report that was given to you for any specific questions about what was found during the examination.  If the procedure report does not answer your questions, please call your gastroenterologist to clarify.  If you requested that your care partner not be given the details of your procedure findings, then the procedure report has been included in a sealed envelope for you to review at your convenience later.  YOU SHOULD EXPECT: Some feelings of bloating in the abdomen. Passage of more gas than usual.  Walking can help get rid of the air that was put into your GI tract during the procedure and reduce the bloating. If you had a lower endoscopy (such as a colonoscopy or flexible sigmoidoscopy) you may notice spotting of blood in your stool or on the toilet paper. If you underwent a bowel prep for your procedure, then you may not have a normal bowel movement for a few days.  DIET: Your first meal following the procedure should be a light meal and then it is ok to progress to your normal diet.  A half-sandwich or bowl of soup is an example of a good first meal.  Heavy or fried foods are harder to digest and may make you feel nauseous or bloated.  Likewise meals heavy in dairy and vegetables can cause extra gas to form and this can also increase the bloating.  Drink plenty of fluids but you should avoid alcoholic beverages for 24 hours.  ACTIVITY: Your care partner should take you home directly after the procedure.  You should plan to take it easy, moving slowly for the rest of the day.  You can resume normal activity the day after the procedure however you should NOT DRIVE or use heavy machinery for 24 hours (because of the sedation medicines used during the test).    SYMPTOMS TO REPORT IMMEDIATELY: A gastroenterologist can be reached at  any hour.  During normal business hours, 8:30 AM to 5:00 PM Monday through Friday, call [phone].  After hours and on weekends, please call the GI answering service at [phone] who will take a message and have the physician on call contact you.   Following lower endoscopy (colonoscopy or flexible sigmoidoscopy):  Excessive amounts of blood in the stool  Significant tenderness or worsening of abdominal pains  Swelling of the abdomen that is new, acute  Fever of 100F or higher   FOLLOW UP: If any biopsies were taken you will be contacted by phone or by letter within the next 1-3 weeks.  Call your gastroenterologist if you have not heard about the biopsies in 3 weeks.  Our staff will call the home number listed on your records the next business day following your procedure to check on you and address any questions or concerns that you may have at that time regarding the information given to you following your procedure. This is a courtesy call and so if there is no answer at the home number and we have not heard from you through the emergency physician on call, we will assume that you have returned to your regular daily activities without incident.  SIGNATURES/CONFIDENTIALITY: You and/or your care partner have signed paperwork which will be entered into your electronic medical record.  These signatures attest to the fact that that the information above on your After Visit Summary has been  reviewed and is understood.  Full responsibility of the confidentiality of this discharge information lies with you and/or your care-partner. 

## 2013-10-27 NOTE — Progress Notes (Signed)
Patient has flat affect;  Lip smacking and twitching of the mouth prior to the procedure.

## 2013-10-27 NOTE — Op Note (Signed)
[place]  [address] [place], [id number]   COLONOSCOPY PROCEDURE REPORT  PATIENT: Amy, [name]  MR#: [id number] BIRTHDATE: [birth year] , 8  yrs. old GENDER: Female ENDOSCOPIST: Inda Castle, MD REFERRED BY: PROCEDURE DATE:  10/27/2013 PROCEDURE:   Colonoscopy, diagnostic First Screening Colonoscopy - Avg.  risk and is 50 yrs.  old or older - No.  Prior Negative Screening - Now for repeat screening. 10 or more years since last screening  History of Adenoma - Now for follow-up colonoscopy & has been > or = to 3 yrs.  N/A  Polyps Removed Today? No.  Recommend repeat exam, <10 yrs? No. ASA CLASS:   Class II INDICATIONS:Average risk patient for colon cancer. MEDICATIONS: MAC sedation, administered by CRNA and propofol (Diprivan) 300mg  IV  DESCRIPTION OF PROCEDURE:   After the risks benefits and alternatives of the procedure were thoroughly explained, informed consent was obtained.  A digital rectal exam revealed no abnormalities of the rectum.   The [id number]  endoscope was introduced through the anus and advanced to the cecum, which was identified by both the appendix and ileocecal valve. No adverse events experienced.   The quality of the prep was Suprep good  The instrument was then slowly withdrawn as the colon was fully examined.      COLON FINDINGS: A normal appearing cecum, ileocecal valve, and appendiceal orifice were identified.  The ascending, hepatic flexure, transverse, splenic flexure, descending, sigmoid colon and rectum appeared unremarkable.  No polyps or cancers were seen. Retroflexed views revealed no abnormalities. The time to cecum=8 minutes 48 seconds.  Withdrawal time=11 minutes 36 seconds.  The scope was withdrawn and the procedure completed. COMPLICATIONS: There were no complications.  ENDOSCOPIC IMPRESSION: Normal colon  RECOMMENDATIONS: Continue current colorectal screening recommendations for "routine risk"  patients with a repeat colonoscopy in 10 years.   eSigned:  Inda Castle, MD 10/27/2013 10:25 AM   cc:   PATIENT NAME:  Amy, [name] MR#: [id number]

## 2013-10-28 ENCOUNTER — Telehealth: Payer: Self-pay | Admitting: *Deleted

## 2013-10-28 NOTE — Telephone Encounter (Signed)
  Follow up Call-  Call back number 10/27/2013  Post procedure Call Back phone  # [phone]  Permission to leave phone message Yes     Patient questions:  Do you have a fever, pain , or abdominal swelling? no Pain Score  0 *  Have you tolerated food without any problems? yes  Have you been able to return to your normal activities? yes  Do you have any questions about your discharge instructions: Diet   no Medications  no Follow up visit  no  Do you have questions or concerns about your Care? no  Actions: * If pain score is 4 or above: No action needed, pain <4.

## 2013-11-17 ENCOUNTER — Other Ambulatory Visit (INDEPENDENT_AMBULATORY_CARE_PROVIDER_SITE_OTHER): Payer: 59

## 2013-11-17 ENCOUNTER — Encounter: Payer: 59 | Attending: Endocrinology | Admitting: Nutrition

## 2013-11-17 VITALS — Wt 181.8 lb

## 2013-11-17 DIAGNOSIS — E1149 Type 2 diabetes mellitus with other diabetic neurological complication: Secondary | ICD-10-CM

## 2013-11-17 DIAGNOSIS — Z713 Dietary counseling and surveillance: Secondary | ICD-10-CM | POA: Insufficient documentation

## 2013-11-17 DIAGNOSIS — E119 Type 2 diabetes mellitus without complications: Secondary | ICD-10-CM | POA: Insufficient documentation

## 2013-11-17 LAB — COMPREHENSIVE METABOLIC PANEL
ALBUMIN: 3.7 g/dL (ref 3.5–5.2)
ALT: 23 U/L (ref 0–35)
AST: 28 U/L (ref 0–37)
Alkaline Phosphatase: 99 U/L (ref 39–117)
BUN: 8 mg/dL (ref 6–23)
CALCIUM: 9 mg/dL (ref 8.4–10.5)
CHLORIDE: 102 meq/L (ref 96–112)
CO2: 31 meq/L (ref 19–32)
Creatinine, Ser: 0.8 mg/dL (ref 0.4–1.2)
GFR: 91.45 mL/min (ref >60.00)
Glucose, Bld: 103 mg/dL — ABNORMAL HIGH (ref 70–99)
POTASSIUM: 3.6 meq/L (ref 3.5–5.1)
Sodium: 140 mEq/L (ref 135–145)
Total Bilirubin: 0.4 mg/dL (ref 0.3–1.2)
Total Protein: 6.9 g/dL (ref 6.0–8.3)

## 2013-11-17 LAB — HEMOGLOBIN A1C: Hgb A1c MFr Bld: 8.1 % — ABNORMAL HIGH (ref 4.6–6.5)

## 2013-11-17 NOTE — Progress Notes (Signed)
Pt. Is here today because she is concerned about her weight.  Her weight is down 8 pounds since last visit.  She says she has cut down on her carbs and portion sizes. Wakes at 5:30.  Goes to work at 7:30.  At [place], she tests her blood sugar--which she says is "always good".  Meter download shows 1 FBS in last 2 weeks, which was 121.     Bfast:  8AM: 3 peanut butter crackers or a piece of fruit and tea (80 calories).  No insulin is taken at this time.              11AM: 2 eggs and carton of light yogurt with tea again or water.  She will take 3-4u of Humalog at this time.              1:30PM: Smaller salad from [facility] which has chicken and dried fruit on it.  She will use 1 whole packet of dressing.  Water to rink.  She takes 6u of insulin at this time.                4PM: 1 cheese stick (80 calories)                          5:30 PM:  Large salad from [facility] with 2 packets of salad dressing.  She will take 6u of Humalog for this.              8PM: another cheese stick or yogurt with tea.  She will take 24u of Lantus at this time. Exercise: she will take the stairs at work.   SBGM:  Meter download shows 4 blood sugars in 2 week. FBS: 121, acL:124, 5PM: 62 (patient said that she was very active at work this day).  She does not know ahead of a meal, if she will be active.  HS: 123.    Encouraged her to do FBSs, and around one meal, both before and 2hr.  After.   I told her that her meals are balanced, but that the salad dressings can be very high calorie (200) if she is using regular ranch dressing.  Encouraged her to use the light ranch, to reduce the calories by 1/2.  She agreed to do this.  She is consuming around 1600-1800 calories /day if diet history is accurate.  Her BMI is 26%. Encouraged another 10-12 pound wt. Loss to get her to 27% body fat.  She agreed to this goal.  Strongly encouraged her to do some walking for 10 minutes on her brakes at work, or at home after supper, to get 20-30 min. 4-5  days/wk.  She agreed to try this. Discussed the idea of reducing her Humalog dose before lunch by 1-2 units if she knows that she will be busy that day.  She agreed to do this, but says that she does not always know ahead of time if this will happen.

## 2013-11-18 NOTE — Patient Instructions (Addendum)
Test before and 2hr. After one meal each day. Walk for 10 minutes on lunch break, and at least 10-15 minutes after supper, 4-5 days/wk. Use light ranch salad dressings at [facility] on salads Reduce the Humalog dose at lunch by 1-2 units if you will be more active that afternoon.

## 2013-11-20 ENCOUNTER — Encounter: Payer: Self-pay | Admitting: Endocrinology

## 2013-11-20 ENCOUNTER — Ambulatory Visit (INDEPENDENT_AMBULATORY_CARE_PROVIDER_SITE_OTHER): Payer: 59 | Admitting: Endocrinology

## 2013-11-20 VITALS — BP 108/60 | HR 106 | Temp 97.9°F | Resp 14 | Ht 65.0 in | Wt 180.6 lb

## 2013-11-20 DIAGNOSIS — E785 Hyperlipidemia, unspecified: Secondary | ICD-10-CM

## 2013-11-20 DIAGNOSIS — I10 Essential (primary) hypertension: Secondary | ICD-10-CM

## 2013-11-20 DIAGNOSIS — E1149 Type 2 diabetes mellitus with other diabetic neurological complication: Secondary | ICD-10-CM

## 2013-11-20 NOTE — Progress Notes (Signed)
Patient ID: Amy [name], female   DOB: [birth year], 58 y.o.   MRN: [id number]   Reason for Appointment:  Followup of diabetes  History of Present Illness   Diagnosis: Type 2 DIABETES MELITUS, date of diagnosis 1993   Prior history: Her diabetes has been treated for several years with basal bolus insulin and also metformin She usually has had A1c results near normal and is fairly compliant with her basal bolus regimen She continues to be on various atypical antipsychotic drugs also However her blood sugars were poorly controlled in 03/2013 Subsequently with a trial of Victoza he did not have much improvement in blood sugars and control of her increased appetite or weight gain  Recent history: She has been on Invokana since 07/2013 because of difficulty with worsening blood sugar control and weight gain Her blood sugars appear to be improving with this and she is doing better with diet and portion control However she has had intermittent Candida vaginitis with this, initially treated with Diflucan; the symptoms are less now Recently however she has not checked her blood sugars much and usually not after supper Most of her blood sugars appear to be fairly good Her Lantus dose was reduced on her last visit because of low normal fasting readings Her weight is slightly better; however has not been doing much exercise Hypoglycemic when she had an episode at work when she took her Humalog and did not eat for 40 minutes and glucose was 48 Also has had readings in the 60s around lunch time especially late Her mealtime there are somewhat variable at work Insulin regimen: Humalog breakfast 3 units, lunch 6-8; supper 10. Lantus insulin 24 units         HYPERGLYCEMIA: None recently Not checking many readings after meals especially supper Proper timing of medications in relation to meals: Yes.             Insulin regimen: Humalog breakfast 3 units, lunch 6-10; supper 10-12. Lantus insulin 26 units  hs        Proper timing of medications in relation to meals: Yes.          Monitors blood glucose: Once a day.    Glucometer:  Accu-Chek           Blood Glucose readings   PREMEAL Breakfast Lunch Dinner Bedtime Overall  Glucose range:  106, 121   62, 66   93-148   123    Mean/median:      105    Diet: Usually low fat; Breakfast: yogurt Exercise regimen: Indoor bike 15 min at times  Wt Readings from Last 3 Encounters:  11/20/13 180 lb 9.6 oz (81.92 kg)  11/17/13 181 lb 12.8 oz (82.464 kg)  10/27/13 186 lb (84.369 kg)   Lab Results  Component Value Date   HGBA1C 8.1* 11/17/2013   HGBA1C 8.9* 07/14/2013   HGBA1C 7.3* 04/21/2013   Lab Results  Component Value Date   MICROALBUR 0.5 04/21/2013   LDLCALC 63 07/14/2013   CREATININE 0.8 11/17/2013    Appointment on 11/17/2013  Component Date Value Ref Range Status  . Hemoglobin A1C 11/17/2013 8.1* 4.6 - 6.5 % Final   Glycemic Control Guidelines for People with Diabetes:Non Diabetic:  <6%Goal of Therapy: <7%Additional Action Suggested:  >8%   . Sodium 11/17/2013 140  135 - 145 mEq/L Final  . Potassium 11/17/2013 3.6  3.5 - 5.1 mEq/L Final  . Chloride 11/17/2013 102  96 - 112 mEq/L Final  . CO2  11/17/2013 31  19 - 32 mEq/L Final  . Glucose, Bld 11/17/2013 103* 70 - 99 mg/dL Final  . BUN 11/17/2013 8  6 - 23 mg/dL Final  . Creatinine, Ser 11/17/2013 0.8  0.4 - 1.2 mg/dL Final  . Total Bilirubin 11/17/2013 0.4  0.3 - 1.2 mg/dL Final  . Alkaline Phosphatase 11/17/2013 99  39 - 117 U/L Final  . AST 11/17/2013 28  0 - 37 U/L Final  . ALT 11/17/2013 23  0 - 35 U/L Final  . Total Protein 11/17/2013 6.9  6.0 - 8.3 g/dL Final  . Albumin 11/17/2013 3.7  3.5 - 5.2 g/dL Final  . Calcium 11/17/2013 9.0  8.4 - 10.5 mg/dL Final  . GFR 11/17/2013 91.45  >60.00 mL/min Final      Medication List       This list is accurate as of: 11/20/13  4:08 PM.  Always use your most recent med list.               accu-chek soft touch lancets  Use as  instructed to check blood sugars daily dx 250.01     amphetamine-dextroamphetamine 15 MG tablet  Commonly known as:  ADDERALL  Take 15 mg by mouth daily.     aspirin 81 MG tablet  Take 81 mg by mouth daily.     Canagliflozin 100 MG Tabs  Commonly known as:  INVOKANA  Take 1 tablet (100 mg total) by mouth daily.     cetirizine 10 MG tablet  Commonly known as:  ZYRTEC  Take 10 mg by mouth daily as needed for allergies.     Clobetasol Propionate 0.05 % lotion     desvenlafaxine 100 MG 24 hr tablet  Commonly known as:  PRISTIQ  Take 100 mg by mouth daily.     ergocalciferol 50000 UNITS capsule  Commonly known as:  VITAMIN D2  Take 50,000 Units by mouth once a week.     esomeprazole 40 MG capsule  Commonly known as:  NEXIUM  Take 40 mg by mouth daily before breakfast.     ezetimibe-simvastatin 10-80 MG per tablet  Commonly known as:  VYTORIN  Take 1 tablet by mouth at bedtime.     FER-IRON PO  Take 1 tablet by mouth daily.     FLUOCINOLONE ACETONIDE SCALP 0.01 % Oil     furosemide 80 MG tablet  Commonly known as:  LASIX  Take 80 mg by mouth daily.     gabapentin 300 MG capsule  Commonly known as:  NEURONTIN  Take 2 capsules (600 mg total) by mouth 3 (three) times daily.     glucose blood test strip  Commonly known as:  ACCU-CHEK SMARTVIEW  Use as instructed, dx code 250.00     imipramine 50 MG tablet  Commonly known as:  TOFRANIL  200 mg.     Insulin Glargine 100 UNIT/ML Solostar Pen  Commonly known as:  LANTUS SOLOSTAR  Inject 26 Units into the skin at bedtime.     insulin lispro 100 UNIT/ML KiwkPen  Commonly known as:  HUMALOG  6-10 Units. Max 18 units per day (1-6 units per meal)     KLOR-CON M20 20 MEQ tablet  Generic drug:  potassium chloride SA     metFORMIN 1000 MG tablet  Commonly known as:  GLUCOPHAGE  Take 1 tablet (1,000 mg total) by mouth 2 (two) times daily with a meal.     NON FORMULARY  at bedtime.  NUQUIN HP 4 % Crea      nystatin 100000 UNIT/ML suspension  Commonly known as:  MYCOSTATIN  Take 5 mLs by mouth 4 (four) times daily.     QUEtiapine 50 MG tablet  Commonly known as:  SEROQUEL  Take 50 mg by mouth at bedtime.     RHINOCORT AQUA 32 MCG/ACT nasal spray  Generic drug:  budesonide  Place 1 spray into the nose daily as needed (congestion). Use as needed     telmisartan 80 MG tablet  Commonly known as:  MICARDIS  Take 80 mg by mouth daily.     triamcinolone cream 0.1 %  Commonly known as:  KENALOG        Allergies: No Known Allergies  Past Medical History  Diagnosis Date  . DIABETES MELLITUS, TYPE II 03/14/2007  . HYPERTENSION 03/14/2007  . SLEEP APNEA 01/18/2009    CPAP  . OBESITY, MORBID 01/18/2009  . Edema 12/20/2009  . Depression   . Dyslipidemia   . DM type 2 with diabetic peripheral neuropathy     painful  . Osteoarthritis     knees  . CTS (carpal tunnel syndrome)   . Diverticulosis   . Bipolar disorder   . Vitamin D deficiency   . Anemia     unspecified  . GERD (gastroesophageal reflux disease)   . Transient ischemic attack (TIA) 04/09/2013    Past Surgical History  Procedure Laterality Date  . Laparoscopic gastric banding  2007  . Esophagogastroduodenoscopy  02/03/2004  . Treadmill cardiolite  2002  . Lower arterial  02/03/2004    Family History  Problem Relation Age of Onset  . Heart disease Father   . Breast cancer Sister   . Diabetes Sister   . Colon cancer Neg Hx   . Sarcoidosis Brother     Social History:  reports that she has never smoked. She has never used smokeless tobacco. She reports that she does not drink alcohol or use illicit drugs.  Review of Systems:  She has a history of obesity, previously weight was 250, treated with a LAP-BAND.   HYPERTENSION:   This in mild and well controlled with Micardis and Lasix  HYPERLIPIDEMIA: The lipid abnormality consists of elevated LDL. Has been given Vytorin by PCP Lab Results  Component Value Date    LDLCALC 63 07/14/2013   No history of recent leg edema, currently on 80 mg Lasix. She thinks her feet may be puffy at times  She has vitamin D deficiency  Has only minimal symptoms of neuropathy recently although does have mild sensory loss on exam      Examination:   BP 108/60  Pulse 106  Temp(Src) 97.9 F (36.6 C)  Resp 14  Ht 5\' 5"  (1.651 m)  Wt 180 lb 9.6 oz (81.92 kg)  BMI 30.05 kg/m2  SpO2 97%  Body mass index is 30.05 kg/(m^2).   Diabetic foot exam done No pedal edema present  ASSESSMENT/ PLAN:   Diabetes type 2   The patient's diabetes control is appearing to be overall better with  Invokana  However her A1c is still 8.1% and blood sugar improvement may be only recent She would like to continue Invokana or the benefit even though she has mild candidiasis again  She can do better with glucose monitoring, exercise, making sure she is taking insulin right before eating and balanced meals Above recommendations discussed in detail Currently because of low normal readings at lunch will reduce her Lantus again by  2 units She will call if she has any unusual high or low readings  HYPERTENSION: The blood pressure is low normal Most likely this is the benefit of Invokana and advised her to cut her Lasix in half    Counseling time over 50% of today's 25 minute visit  Pancho Rushing 11/20/2013, 4:08 PM

## 2013-11-20 NOTE — Patient Instructions (Addendum)
Reduce Furosemide to 1/2  Lantus: take 22 units; call if blood sugars are unusually low  Eat within 5-10 min of taking Humalog  Please check blood sugars at least half the time about 2 hours after any meal and as directed on waking up. Call if blood sugars after meals are consistently over 180  Please bring blood sugar monitor to each visit

## 2013-12-18 ENCOUNTER — Ambulatory Visit (INDEPENDENT_AMBULATORY_CARE_PROVIDER_SITE_OTHER): Payer: 59 | Admitting: Physician Assistant

## 2013-12-18 ENCOUNTER — Encounter (INDEPENDENT_AMBULATORY_CARE_PROVIDER_SITE_OTHER): Payer: Self-pay

## 2013-12-18 VITALS — BP 118/72 | HR 72 | Temp 98.7°F | Resp 14 | Ht 65.0 in | Wt 180.4 lb

## 2013-12-18 DIAGNOSIS — Z4651 Encounter for fitting and adjustment of gastric lap band: Secondary | ICD-10-CM

## 2013-12-18 NOTE — Progress Notes (Signed)
  HISTORY: Amy [name] is a 58 y.o.female who received an 10cm lap-band in April 2007 by Dr. Hassell Done. She comes in with 8 lbs weight loss since she saw Dr. Hassell Done in January. She's lost a total of 61 lbs since surgery. She comes in with complaints of lack of restriction and eating too much. She denies regurgitation or reflux symptoms. She has occasional nausea in the morning which she attributes to one of her medications.  VITAL SIGNS: Filed Vitals:   12/18/13 1034  BP: 118/72  Pulse: 72  Temp: 98.7 F (37.1 C)  Resp: 14    PHYSICAL EXAM: Physical exam reveals a very well-appearing 58 y.o.female in no apparent distress Neurologic: Awake, alert, oriented Psych: Bright affect, conversant Respiratory: Breathing even and unlabored. No stridor or wheezing Abdomen: Soft, nontender, nondistended to palpation. Incisions well-healed. No incisional hernias. Port easily palpated. Extremities: Atraumatic, good range of motion.  ASSESMENT: 58 y.o.  female  s/p 10cm lap-band.   PLAN: The patient's port was accessed with a 20G Huber needle without difficulty. Clear fluid was aspirated and 0.25 mL saline was added to the port. The patient was able to swallow water without difficulty following the procedure and was instructed to take clear liquids for the next 24-48 hours and advance slowly as tolerated. We'll have her back in six months or sooner if needed.

## 2013-12-18 NOTE — Patient Instructions (Signed)

## 2013-12-29 ENCOUNTER — Other Ambulatory Visit: Payer: Self-pay | Admitting: *Deleted

## 2013-12-29 ENCOUNTER — Telehealth: Payer: Self-pay | Admitting: Endocrinology

## 2013-12-29 MED ORDER — CANAGLIFLOZIN 100 MG PO TABS: 100.0000 mg | ORAL_TABLET | Freq: Every day | ORAL | Status: DC

## 2013-12-29 NOTE — Telephone Encounter (Signed)
Patient called in stating she needs her Invokana [facility] She states she only has 2 days left; do we have samples   Call back: [phone]  Thank You :)

## 2013-12-29 NOTE — Telephone Encounter (Signed)
Rx sent. Pt called, she can get some samples. Pt will pick up.

## 2014-01-12 ENCOUNTER — Other Ambulatory Visit (HOSPITAL_COMMUNITY): Payer: Self-pay | Admitting: Family Medicine

## 2014-01-12 DIAGNOSIS — Z1231 Encounter for screening mammogram for malignant neoplasm of breast: Secondary | ICD-10-CM

## 2014-01-22 ENCOUNTER — Other Ambulatory Visit: Payer: Self-pay | Admitting: *Deleted

## 2014-01-22 ENCOUNTER — Telehealth: Payer: Self-pay | Admitting: Endocrinology

## 2014-01-22 MED ORDER — INSULIN GLARGINE 100 UNIT/ML SOLOSTAR PEN: 26.0000 [IU] | PEN_INJECTOR | Freq: Every day | SUBCUTANEOUS | Status: DC

## 2014-01-22 NOTE — Telephone Encounter (Signed)
pelase call in the lantus for 90 day supply to [facility]

## 2014-01-22 NOTE — Telephone Encounter (Signed)
rx sent

## 2014-02-16 ENCOUNTER — Other Ambulatory Visit: Payer: 59

## 2014-02-16 DIAGNOSIS — E1149 Type 2 diabetes mellitus with other diabetic neurological complication: Secondary | ICD-10-CM

## 2014-02-16 DIAGNOSIS — E785 Hyperlipidemia, unspecified: Secondary | ICD-10-CM

## 2014-02-16 LAB — MICROALBUMIN / CREATININE URINE RATIO
Creatinine,U: 325.9 mg/dL
Microalb Creat Ratio: 0.4 mg/g (ref 0.0–30.0)
Microalb, Ur: 1.2 mg/dL (ref 0.0–1.9)

## 2014-02-16 LAB — COMPREHENSIVE METABOLIC PANEL
ALT: 12 U/L (ref 0–35)
AST: 15 U/L (ref 0–37)
Albumin: 3.9 g/dL (ref 3.5–5.2)
Alkaline Phosphatase: 80 U/L (ref 39–117)
BUN: 9 mg/dL (ref 6–23)
CHLORIDE: 100 meq/L (ref 96–112)
CO2: 32 meq/L (ref 19–32)
Calcium: 9.5 mg/dL (ref 8.4–10.5)
Creatinine, Ser: 1 mg/dL (ref 0.4–1.2)
GFR: 71.22 mL/min (ref >60.00)
Glucose, Bld: 86 mg/dL (ref 70–99)
Potassium: 3.6 mEq/L (ref 3.5–5.1)
Sodium: 139 mEq/L (ref 135–145)
Total Bilirubin: 0.5 mg/dL (ref 0.2–1.2)
Total Protein: 7.3 g/dL (ref 6.0–8.3)

## 2014-02-16 LAB — LIPID PANEL
Cholesterol: 109 mg/dL (ref 0–200)
HDL: 45.4 mg/dL (ref >39.00)
LDL Cholesterol: 48 mg/dL (ref 0–99)
NONHDL: 63.6
TRIGLYCERIDES: 76 mg/dL (ref 0.0–149.0)
Total CHOL/HDL Ratio: 2
VLDL: 15.2 mg/dL (ref 0.0–40.0)

## 2014-02-16 LAB — HEMOGLOBIN A1C: Hgb A1c MFr Bld: 6.9 % — ABNORMAL HIGH (ref 4.6–6.5)

## 2014-02-20 ENCOUNTER — Ambulatory Visit (INDEPENDENT_AMBULATORY_CARE_PROVIDER_SITE_OTHER): Payer: 59 | Admitting: Endocrinology

## 2014-02-20 VITALS — BP 112/64 | HR 104 | Temp 99.0°F | Resp 12 | Wt 159.0 lb

## 2014-02-20 DIAGNOSIS — R634 Abnormal weight loss: Secondary | ICD-10-CM

## 2014-02-20 DIAGNOSIS — E1149 Type 2 diabetes mellitus with other diabetic neurological complication: Secondary | ICD-10-CM

## 2014-02-20 DIAGNOSIS — I10 Essential (primary) hypertension: Secondary | ICD-10-CM

## 2014-02-20 NOTE — Progress Notes (Signed)
Patient ID: Amy [name], female   DOB: November 05, 1955, 58 y.o.   MRN: [id number]   Reason for Appointment:  Followup of diabetes  History of Present Illness   Diagnosis: Type 2 DIABETES MELITUS, date of diagnosis 1993   Prior history: Her diabetes has been treated for several years with basal bolus insulin and also metformin She usually has had A1c results near normal and is fairly compliant with her basal bolus regimen She continues to be on various atypical antipsychotic drugs also However her blood sugars were poorly controlled in 03/2013 Subsequently with a trial of Victoza he did not have much improvement in blood sugars and control of her increased appetite or weight gain  Recent history: She has been on Invokana since 07/2013 because of difficulty with worsening blood sugar control and weight gain Since her last visit she has dramatically lost over 20 pounds which she thinks is from significantly decreasing her food intake and snacks Her blood sugars are fairly normal at home including low normal readings before and after breakfast Has not checked many readings later in the day and has not reduce her insulin For some reason she is getting more insulin at breakfast than before even though readings are low normal No further side effects of candidiasis with Invokana A1c has also improved very significantly Insulin regimen: Humalog breakfast 6-10 units, lunch 6; supper 6. Lantus insulin 24 units        Oral hypoglycemic drugs: Invokana 100 mg, metformin 1 g twice a day Proper timing of medications in relation to meals: Yes.             Insulin regimen: Humalog breakfast 3 units, lunch 6-10; supper 10-12. Lantus insulin 26 units hs        Proper timing of medications in relation to meals: Yes.          Monitors blood glucose:  less than once a day     Glucometer:  Accu-Chek           Blood Glucose readings from download, only 12 readings in the last month  Fasting range 63, 69, p.c.  breakfast 63-136 and afternoon/evening 87-91  Diet: Usually low fat; Breakfast: yogurt Exercise regimen: Indoor bike 15 min at times  Wt Readings from Last 3 Encounters:  02/20/14 159 lb (72.122 kg)  12/18/13 180 lb 6.4 oz (81.829 kg)  11/20/13 180 lb 9.6 oz (81.92 kg)   Lab Results  Component Value Date   HGBA1C 6.9* 02/16/2014   HGBA1C 8.1* 11/17/2013   HGBA1C 8.9* 07/14/2013   Lab Results  Component Value Date   MICROALBUR 1.2 02/16/2014   LDLCALC 48 02/16/2014   CREATININE 1.0 02/16/2014    Appointment on 02/16/2014  Component Date Value Ref Range Status  . Microalb, Ur 02/16/2014 1.2  0.0 - 1.9 mg/dL Final  . Creatinine,U 02/16/2014 325.9   Final  . Microalb Creat Ratio 02/16/2014 0.4  0.0 - 30.0 mg/g Final  . Hemoglobin A1C 02/16/2014 6.9* 4.6 - 6.5 % Final   Glycemic Control Guidelines for People with Diabetes:Non Diabetic:  <6%Goal of Therapy: <7%Additional Action Suggested:  >8%   . Sodium 02/16/2014 139  135 - 145 mEq/L Final  . Potassium 02/16/2014 3.6  3.5 - 5.1 mEq/L Final  . Chloride 02/16/2014 100  96 - 112 mEq/L Final  . CO2 02/16/2014 32  19 - 32 mEq/L Final  . Glucose, Bld 02/16/2014 86  70 - 99 mg/dL Final  . BUN 02/16/2014 9  6 - 23 mg/dL Final  . Creatinine, Ser 02/16/2014 1.0  0.4 - 1.2 mg/dL Final  . Total Bilirubin 02/16/2014 0.5  0.2 - 1.2 mg/dL Final  . Alkaline Phosphatase 02/16/2014 80  39 - 117 U/L Final  . AST 02/16/2014 15  0 - 37 U/L Final  . ALT 02/16/2014 12  0 - 35 U/L Final  . Total Protein 02/16/2014 7.3  6.0 - 8.3 g/dL Final  . Albumin 02/16/2014 3.9  3.5 - 5.2 g/dL Final  . Calcium 02/16/2014 9.5  8.4 - 10.5 mg/dL Final  . GFR 02/16/2014 71.22  >60.00 mL/min Final  . Cholesterol 02/16/2014 109  0 - 200 mg/dL Final   ATP III Classification       Desirable:  < 200 mg/dL               Borderline High:  200 - 239 mg/dL          High:  > = 240 mg/dL  . Triglycerides 02/16/2014 76.0  0.0 - 149.0 mg/dL Final   Normal:  <150 mg/dLBorderline  High:  150 - 199 mg/dL  . HDL 02/16/2014 45.40  >39.00 mg/dL Final  . VLDL 02/16/2014 15.2  0.0 - 40.0 mg/dL Final  . LDL Cholesterol 02/16/2014 48  0 - 99 mg/dL Final  . Total CHOL/HDL Ratio 02/16/2014 2   Final                  Men          Women1/2 Average Risk     3.4          3.3Average Risk          5.0          4.42X Average Risk          9.6          7.13X Average Risk          15.0          11.0                      . NonHDL 02/16/2014 63.60   Final      Medication List       This list is accurate as of: 02/20/14  4:25 PM.  Always use your most recent med list.               accu-chek soft touch lancets  Use as instructed to check blood sugars daily dx 250.01     amphetamine-dextroamphetamine 15 MG tablet  Commonly known as:  ADDERALL  Take 15 mg by mouth daily.     aspirin 81 MG tablet  Take 81 mg by mouth daily.     Canagliflozin 100 MG Tabs  Commonly known as:  INVOKANA  Take 1 tablet (100 mg total) by mouth daily.     cetirizine 10 MG tablet  Commonly known as:  ZYRTEC  Take 10 mg by mouth daily as needed for allergies.     Clobetasol Propionate 0.05 % lotion     desvenlafaxine 100 MG 24 hr tablet  Commonly known as:  PRISTIQ  Take 100 mg by mouth daily.     ergocalciferol 50000 UNITS capsule  Commonly known as:  VITAMIN D2  Take 50,000 Units by mouth once a week.     esomeprazole 40 MG capsule  Commonly known as:  NEXIUM  Take 40 mg by mouth daily before breakfast.  ezetimibe-simvastatin 10-80 MG per tablet  Commonly known as:  VYTORIN  Take 1 tablet by mouth at bedtime.     FER-IRON PO  Take 1 tablet by mouth daily.     FLUOCINOLONE ACETONIDE SCALP 0.01 % Oil     furosemide 80 MG tablet  Commonly known as:  LASIX  Take 80 mg by mouth daily.     gabapentin 300 MG capsule  Commonly known as:  NEURONTIN  Take 2 capsules (600 mg total) by mouth 3 (three) times daily.     glucose blood test strip  Commonly known as:  ACCU-CHEK SMARTVIEW   Use as instructed, dx code 250.00     imipramine 50 MG tablet  Commonly known as:  TOFRANIL  200 mg.     Insulin Glargine 100 UNIT/ML Solostar Pen  Commonly known as:  LANTUS SOLOSTAR  Inject 26 Units into the skin at bedtime.     insulin lispro 100 UNIT/ML KiwkPen  Commonly known as:  HUMALOG  6-10 Units. Max 18 units per day (1-6 units per meal)     KLOR-CON M20 20 MEQ tablet  Generic drug:  potassium chloride SA     metFORMIN 1000 MG tablet  Commonly known as:  GLUCOPHAGE  Take 1 tablet (1,000 mg total) by mouth 2 (two) times daily with a meal.     NON FORMULARY  at bedtime.     NUQUIN HP 4 % Crea     nystatin 100000 UNIT/ML suspension  Commonly known as:  MYCOSTATIN  Take 5 mLs by mouth 4 (four) times daily.     QUEtiapine 50 MG tablet  Commonly known as:  SEROQUEL  Take 50 mg by mouth at bedtime.     RHINOCORT AQUA 32 MCG/ACT nasal spray  Generic drug:  budesonide  Place 1 spray into the nose daily as needed (congestion). Use as needed     telmisartan 80 MG tablet  Commonly known as:  MICARDIS  Take 80 mg by mouth daily.     triamcinolone cream 0.1 %  Commonly known as:  KENALOG        Allergies: No Known Allergies  Past Medical History  Diagnosis Date  . DIABETES MELLITUS, TYPE II 03/14/2007  . HYPERTENSION 03/14/2007  . SLEEP APNEA 01/18/2009    CPAP  . OBESITY, MORBID 01/18/2009  . Edema 12/20/2009  . Depression   . Dyslipidemia   . DM type 2 with diabetic peripheral neuropathy     painful  . Osteoarthritis     knees  . CTS (carpal tunnel syndrome)   . Diverticulosis   . Bipolar disorder   . Vitamin D deficiency   . Anemia     unspecified  . GERD (gastroesophageal reflux disease)   . Transient ischemic attack (TIA) 04/09/2013    Past Surgical History  Procedure Laterality Date  . Laparoscopic gastric banding  2007  . Esophagogastroduodenoscopy  02/03/2004  . Treadmill cardiolite  2002  . Lower arterial  02/03/2004    Family  History  Problem Relation Age of Onset  . Heart disease Father   . Breast cancer Sister   . Diabetes Sister   . Colon cancer Neg Hx   . Sarcoidosis Brother     Social History:  reports that she has never smoked. She has never used smokeless tobacco. She reports that she does not drink alcohol or use illicit drugs.  Review of Systems:  She has a history of obesity, previously weight was 250, treated with  a LAP-BAND.  Weight loss: As above this may be related to her using Invokana Not clear if she is having more depression, has seen her PCP fairly recently  HYPERTENSION:   This in mild and well controlled with Micardis, and is on 80 mg   Does not feel lightheaded  HYPERLIPIDEMIA: The lipid abnormality consists of elevated LDL. Has been given Vytorin by PCP and LDL is rather low, she has no history of CAD  Lab Results  Component Value Date   LDLCALC 48 02/16/2014   No history of recent leg edema; she was told to try only 40 mg Lasix on her last visit  She has vitamin D deficiency  Has only minimal symptoms of neuropathy recently although does have mild sensory loss on exam  Diabetic foot exam done 3/15     Examination:   BP 112/64  Pulse 104  Temp(Src) 99 F (37.2 C) (Oral)  Resp 12  Wt 159 lb (72.122 kg)  SpO2 96%  Body mass index is 26.46 kg/(m^2).   No pedal edema present  ASSESSMENT/ PLAN:   Diabetes type 2   The patient's diabetes control is excellent recently with continuing  Invokana and is responding to only 100 mg  She has lost a significant amount of weight in the last 3 months which she thinks is from reducing portions and snacks A1c is below 7%  now Since she is not having side effects from Early will continue this Discussed blood sugar targets and to avoid hypoglycemia but she is having periodically before and after breakfast Discussed is needing to adjust mealtime insulin if postprandial readings are lower and also Lantus the fasting readings are  consistently low normal Does not have enough readings to adjust her insulin at lunch and dinner although not having hyperglycemia after these meals New insulin doses: Humalog breakfast 4 units, lunch 4; supper 4. Lantus insulin 18 units  She can do better with glucose monitoring including more readings after dinner Recommended balanced meals with 14  HYPERTENSION: The blood pressure is low normal Most likely this is the benefit of Invokana and advised her to cut her Micardis in half Will need followup with PCP for blood pressure  Hyperlipidemia: Her LDL is well below target and she does not have CAD. Recommend a generic statin only treatment regimen instead of Vytorin and she will discuss with PCP    Counseling time over 50% of today's 25 minute visit  KUMAR,AJAY 02/20/2014, 4:25 PM

## 2014-02-20 NOTE — Patient Instructions (Addendum)
Humalog breakfast 4 units, lunch 4; supper 4. Lantus insulin 18 units  Please check blood sugars at least half the time about 2 hours after any meal and times per week on waking up. Please bring blood sugar monitor to each visit  Micardis 1/2 tab daily

## 2014-02-23 ENCOUNTER — Ambulatory Visit (HOSPITAL_COMMUNITY)
Admission: RE | Admit: 2014-02-23 | Discharge: 2014-02-23 | Disposition: A | Discharge status: Home / Self Care | Payer: 59 | Source: Ambulatory Visit | Attending: Family Medicine | Admitting: Family Medicine

## 2014-02-23 DIAGNOSIS — Z1231 Encounter for screening mammogram for malignant neoplasm of breast: Secondary | ICD-10-CM | POA: Diagnosis present

## 2014-04-16 ENCOUNTER — Other Ambulatory Visit: Payer: Self-pay | Admitting: *Deleted

## 2014-04-16 ENCOUNTER — Telehealth: Payer: Self-pay | Admitting: Endocrinology

## 2014-04-16 MED ORDER — INSULIN GLARGINE 100 UNIT/ML SOLOSTAR PEN: 26.0000 [IU] | PEN_INJECTOR | Freq: Every day | SUBCUTANEOUS | Status: DC

## 2014-04-16 MED ORDER — CANAGLIFLOZIN 100 MG PO TABS: 100.0000 mg | ORAL_TABLET | Freq: Every day | ORAL | Status: DC

## 2014-04-16 NOTE — Telephone Encounter (Signed)
Patient need refill of Lantus, Invokana 100 mg,

## 2014-04-17 ENCOUNTER — Other Ambulatory Visit: Payer: Self-pay | Admitting: *Deleted

## 2014-04-17 MED ORDER — CANAGLIFLOZIN 100 MG PO TABS: 100.0000 mg | ORAL_TABLET | Freq: Every day | ORAL | Status: DC

## 2014-04-17 MED ORDER — INSULIN PEN NEEDLE 31G X 5 MM MISC: Status: DC

## 2014-04-17 NOTE — Telephone Encounter (Signed)
Patient nee refill of Invokana 100 mg, and Lantus pin needles

## 2014-06-12 ENCOUNTER — Other Ambulatory Visit: Payer: Self-pay

## 2014-07-20 ENCOUNTER — Other Ambulatory Visit: Payer: Self-pay | Admitting: *Deleted

## 2014-07-20 ENCOUNTER — Telehealth: Payer: Self-pay | Admitting: Endocrinology

## 2014-07-20 MED ORDER — CANAGLIFLOZIN 100 MG PO TABS: 100.0000 mg | ORAL_TABLET | Freq: Every day | ORAL | Status: DC

## 2014-07-20 NOTE — Telephone Encounter (Signed)
Pt needs invokana called in 90 day supply to be [facility]

## 2014-07-21 ENCOUNTER — Telehealth: Payer: Self-pay | Admitting: Endocrinology

## 2014-07-21 NOTE — Telephone Encounter (Signed)
Try Vania Rea or [facility] Glade

## 2014-07-21 NOTE — Telephone Encounter (Signed)
Please advise 

## 2014-07-21 NOTE — Telephone Encounter (Signed)
[facility] not able to get [facility]  Patient states its costly Is there another option and she only has 2 days left    Thank you

## 2014-07-28 ENCOUNTER — Other Ambulatory Visit: Payer: Self-pay | Admitting: *Deleted

## 2014-07-28 ENCOUNTER — Telehealth: Payer: Self-pay | Admitting: Endocrinology

## 2014-07-28 MED ORDER — CANAGLIFLOZIN 100 MG PO TABS: 100.0000 mg | ORAL_TABLET | Freq: Every day | ORAL | Status: DC

## 2014-07-28 NOTE — Telephone Encounter (Signed)
Patient states she is currently out of her Invokana  Please call in a 7 day supply to [place]    Thank you

## 2014-07-29 ENCOUNTER — Other Ambulatory Visit: Payer: Self-pay | Admitting: *Deleted

## 2014-07-29 MED ORDER — CANAGLIFLOZIN 100 MG PO TABS: 100.0000 mg | ORAL_TABLET | Freq: Every day | ORAL | Status: DC

## 2014-09-03 ENCOUNTER — Other Ambulatory Visit: Payer: Self-pay | Admitting: Orthopedic Surgery

## 2014-09-28 DIAGNOSIS — G5601 Carpal tunnel syndrome, right upper limb: Secondary | ICD-10-CM

## 2014-09-28 HISTORY — DX: Carpal tunnel syndrome, right upper limb: G56.01

## 2014-10-05 ENCOUNTER — Encounter (HOSPITAL_BASED_OUTPATIENT_CLINIC_OR_DEPARTMENT_OTHER)
Admission: RE | Admit: 2014-10-05 | Discharge: 2014-10-05 | Disposition: A | Discharge status: Home / Self Care | Payer: Federal, State, Local not specified - PPO | Source: Ambulatory Visit | Attending: Orthopedic Surgery | Admitting: Orthopedic Surgery

## 2014-10-05 ENCOUNTER — Other Ambulatory Visit: Payer: Self-pay

## 2014-10-05 ENCOUNTER — Encounter (HOSPITAL_BASED_OUTPATIENT_CLINIC_OR_DEPARTMENT_OTHER): Payer: Self-pay | Admitting: *Deleted

## 2014-10-05 DIAGNOSIS — F319 Bipolar disorder, unspecified: Secondary | ICD-10-CM | POA: Diagnosis not present

## 2014-10-05 DIAGNOSIS — G4730 Sleep apnea, unspecified: Secondary | ICD-10-CM | POA: Diagnosis not present

## 2014-10-05 DIAGNOSIS — F329 Major depressive disorder, single episode, unspecified: Secondary | ICD-10-CM | POA: Diagnosis not present

## 2014-10-05 DIAGNOSIS — R000 Tachycardia, unspecified: Secondary | ICD-10-CM | POA: Diagnosis not present

## 2014-10-05 DIAGNOSIS — M79642 Pain in left hand: Secondary | ICD-10-CM | POA: Diagnosis present

## 2014-10-05 DIAGNOSIS — R9431 Abnormal electrocardiogram [ECG] [EKG]: Secondary | ICD-10-CM | POA: Diagnosis not present

## 2014-10-05 DIAGNOSIS — K219 Gastro-esophageal reflux disease without esophagitis: Secondary | ICD-10-CM | POA: Diagnosis not present

## 2014-10-05 DIAGNOSIS — M79641 Pain in right hand: Secondary | ICD-10-CM | POA: Diagnosis present

## 2014-10-05 DIAGNOSIS — Z7982 Long term (current) use of aspirin: Secondary | ICD-10-CM | POA: Diagnosis not present

## 2014-10-05 DIAGNOSIS — E1140 Type 2 diabetes mellitus with diabetic neuropathy, unspecified: Secondary | ICD-10-CM | POA: Diagnosis not present

## 2014-10-05 DIAGNOSIS — G5601 Carpal tunnel syndrome, right upper limb: Secondary | ICD-10-CM | POA: Diagnosis not present

## 2014-10-05 DIAGNOSIS — F909 Attention-deficit hyperactivity disorder, unspecified type: Secondary | ICD-10-CM | POA: Diagnosis not present

## 2014-10-05 DIAGNOSIS — Z79899 Other long term (current) drug therapy: Secondary | ICD-10-CM | POA: Diagnosis not present

## 2014-10-05 DIAGNOSIS — E785 Hyperlipidemia, unspecified: Secondary | ICD-10-CM | POA: Diagnosis not present

## 2014-10-05 DIAGNOSIS — Z862 Personal history of diseases of the blood and blood-forming organs and certain disorders involving the immune mechanism: Secondary | ICD-10-CM | POA: Diagnosis not present

## 2014-10-05 DIAGNOSIS — E559 Vitamin D deficiency, unspecified: Secondary | ICD-10-CM | POA: Diagnosis not present

## 2014-10-05 DIAGNOSIS — I10 Essential (primary) hypertension: Secondary | ICD-10-CM | POA: Diagnosis not present

## 2014-10-05 DIAGNOSIS — G5602 Carpal tunnel syndrome, left upper limb: Secondary | ICD-10-CM | POA: Diagnosis not present

## 2014-10-05 DIAGNOSIS — M179 Osteoarthritis of knee, unspecified: Secondary | ICD-10-CM | POA: Diagnosis not present

## 2014-10-05 DIAGNOSIS — Z9989 Dependence on other enabling machines and devices: Secondary | ICD-10-CM | POA: Diagnosis not present

## 2014-10-05 DIAGNOSIS — Z794 Long term (current) use of insulin: Secondary | ICD-10-CM | POA: Diagnosis not present

## 2014-10-05 LAB — BASIC METABOLIC PANEL
ANION GAP: 7 (ref 5–15)
BUN: 9 mg/dL (ref 6–23)
CALCIUM: 9 mg/dL (ref 8.4–10.5)
CHLORIDE: 99 mmol/L (ref 96–112)
CO2: 31 mmol/L (ref 19–32)
Creatinine, Ser: 0.92 mg/dL (ref 0.50–1.10)
GFR calc Af Amer: 79 mL/min — ABNORMAL LOW (ref >90)
GFR, EST NON AFRICAN AMERICAN: 68 mL/min — AB (ref >90)
Glucose, Bld: 166 mg/dL — ABNORMAL HIGH (ref 70–99)
Potassium: 3.7 mmol/L (ref 3.5–5.1)
Sodium: 137 mmol/L (ref 135–145)

## 2014-10-05 NOTE — Pre-Procedure Instructions (Signed)
EKG reviewed by Dr. Marcie Bal; pt. OK to come for surgery.

## 2014-10-08 ENCOUNTER — Ambulatory Visit (HOSPITAL_BASED_OUTPATIENT_CLINIC_OR_DEPARTMENT_OTHER): Payer: Federal, State, Local not specified - PPO | Admitting: Anesthesiology

## 2014-10-08 ENCOUNTER — Ambulatory Visit (HOSPITAL_BASED_OUTPATIENT_CLINIC_OR_DEPARTMENT_OTHER)
Admission: RE | Admit: 2014-10-08 | Discharge: 2014-10-08 | Disposition: A | Discharge status: Home / Self Care | Payer: Federal, State, Local not specified - PPO | Source: Ambulatory Visit | Attending: Orthopedic Surgery | Admitting: Orthopedic Surgery

## 2014-10-08 ENCOUNTER — Encounter (HOSPITAL_BASED_OUTPATIENT_CLINIC_OR_DEPARTMENT_OTHER): Payer: Self-pay | Admitting: *Deleted

## 2014-10-08 ENCOUNTER — Encounter (HOSPITAL_BASED_OUTPATIENT_CLINIC_OR_DEPARTMENT_OTHER)
Admission: RE | Disposition: A | Discharge status: Home / Self Care | Payer: Self-pay | Source: Ambulatory Visit | Attending: Orthopedic Surgery

## 2014-10-08 DIAGNOSIS — R9431 Abnormal electrocardiogram [ECG] [EKG]: Secondary | ICD-10-CM | POA: Insufficient documentation

## 2014-10-08 DIAGNOSIS — G5602 Carpal tunnel syndrome, left upper limb: Secondary | ICD-10-CM | POA: Insufficient documentation

## 2014-10-08 DIAGNOSIS — I10 Essential (primary) hypertension: Secondary | ICD-10-CM | POA: Insufficient documentation

## 2014-10-08 DIAGNOSIS — M179 Osteoarthritis of knee, unspecified: Secondary | ICD-10-CM | POA: Insufficient documentation

## 2014-10-08 DIAGNOSIS — F319 Bipolar disorder, unspecified: Secondary | ICD-10-CM | POA: Insufficient documentation

## 2014-10-08 DIAGNOSIS — G5601 Carpal tunnel syndrome, right upper limb: Secondary | ICD-10-CM | POA: Diagnosis not present

## 2014-10-08 DIAGNOSIS — E559 Vitamin D deficiency, unspecified: Secondary | ICD-10-CM | POA: Insufficient documentation

## 2014-10-08 DIAGNOSIS — Z9989 Dependence on other enabling machines and devices: Secondary | ICD-10-CM | POA: Insufficient documentation

## 2014-10-08 DIAGNOSIS — F329 Major depressive disorder, single episode, unspecified: Secondary | ICD-10-CM | POA: Insufficient documentation

## 2014-10-08 DIAGNOSIS — E785 Hyperlipidemia, unspecified: Secondary | ICD-10-CM | POA: Insufficient documentation

## 2014-10-08 DIAGNOSIS — Z794 Long term (current) use of insulin: Secondary | ICD-10-CM | POA: Insufficient documentation

## 2014-10-08 DIAGNOSIS — Z79899 Other long term (current) drug therapy: Secondary | ICD-10-CM | POA: Insufficient documentation

## 2014-10-08 DIAGNOSIS — K219 Gastro-esophageal reflux disease without esophagitis: Secondary | ICD-10-CM | POA: Insufficient documentation

## 2014-10-08 DIAGNOSIS — G4730 Sleep apnea, unspecified: Secondary | ICD-10-CM | POA: Insufficient documentation

## 2014-10-08 DIAGNOSIS — F909 Attention-deficit hyperactivity disorder, unspecified type: Secondary | ICD-10-CM | POA: Insufficient documentation

## 2014-10-08 DIAGNOSIS — Z7982 Long term (current) use of aspirin: Secondary | ICD-10-CM | POA: Insufficient documentation

## 2014-10-08 DIAGNOSIS — E1140 Type 2 diabetes mellitus with diabetic neuropathy, unspecified: Secondary | ICD-10-CM | POA: Insufficient documentation

## 2014-10-08 DIAGNOSIS — R000 Tachycardia, unspecified: Secondary | ICD-10-CM | POA: Insufficient documentation

## 2014-10-08 DIAGNOSIS — Z862 Personal history of diseases of the blood and blood-forming organs and certain disorders involving the immune mechanism: Secondary | ICD-10-CM | POA: Insufficient documentation

## 2014-10-08 HISTORY — DX: Reserved for inherently not codable concepts without codable children: IMO0001

## 2014-10-08 HISTORY — DX: Carpal tunnel syndrome, right upper limb: G56.01

## 2014-10-08 HISTORY — PX: CARPAL TUNNEL RELEASE: SHX101

## 2014-10-08 HISTORY — DX: Anxiety disorder, unspecified: F41.9

## 2014-10-08 HISTORY — DX: Long term (current) use of insulin: Z79.4

## 2014-10-08 HISTORY — DX: Essential (primary) hypertension: I10

## 2014-10-08 HISTORY — DX: Type 2 diabetes mellitus without complications: E11.9

## 2014-10-08 HISTORY — DX: Attention-deficit hyperactivity disorder, unspecified type: F90.9

## 2014-10-08 HISTORY — DX: Type 2 diabetes mellitus with diabetic neuropathy, unspecified: E11.40

## 2014-10-08 HISTORY — DX: Unspecified cataract: H26.9

## 2014-10-08 LAB — GLUCOSE, CAPILLARY
GLUCOSE-CAPILLARY: 59 mg/dL — AB (ref 70–99)
GLUCOSE-CAPILLARY: 118 mg/dL — AB (ref 70–99)
Glucose-Capillary: 64 mg/dL — ABNORMAL LOW (ref 70–99)
Glucose-Capillary: 83 mg/dL (ref 70–99)
Glucose-Capillary: 71 mg/dL (ref 70–99)
Glucose-Capillary: 39 mg/dL — CL (ref 70–99)

## 2014-10-08 LAB — POCT HEMOGLOBIN-HEMACUE: Hemoglobin: 12.5 g/dL (ref 12.0–15.0)

## 2014-10-08 SURGERY — CARPAL TUNNEL RELEASE
Anesthesia: Monitor Anesthesia Care | Site: Wrist | Laterality: Right

## 2014-10-08 MED ORDER — PROMETHAZINE HCL 25 MG/ML IJ SOLN: 6.2500 mg | INTRAMUSCULAR | Status: DC | PRN

## 2014-10-08 MED ORDER — MIDAZOLAM HCL 2 MG/2ML IJ SOLN
INTRAMUSCULAR | Status: AC
  Filled 2014-10-08: qty 2

## 2014-10-08 MED ORDER — FENTANYL CITRATE 0.05 MG/ML IJ SOLN
INTRAMUSCULAR | Status: DC | PRN
  Administered 2014-10-08: 100 ug via INTRAVENOUS

## 2014-10-08 MED ORDER — PROPOFOL 10 MG/ML IV BOLUS
INTRAVENOUS | Status: AC
  Filled 2014-10-08: qty 60

## 2014-10-08 MED ORDER — DEXTROSE 50 % IV SOLN
25.0000 mL | Freq: Once | INTRAVENOUS | Status: AC
  Administered 2014-10-08: 25 mL via INTRAVENOUS

## 2014-10-08 MED ORDER — PROPOFOL INFUSION 10 MG/ML OPTIME
INTRAVENOUS | Status: DC | PRN
  Administered 2014-10-08: 50 ug/kg/min via INTRAVENOUS

## 2014-10-08 MED ORDER — LACTATED RINGERS IV SOLN
INTRAVENOUS | Status: DC
  Administered 2014-10-08: 08:00:00 via INTRAVENOUS

## 2014-10-08 MED ORDER — FENTANYL CITRATE 0.05 MG/ML IJ SOLN
50.0000 ug | INTRAMUSCULAR | Status: DC | PRN
Start: 2014-10-08 — End: 2014-10-08

## 2014-10-08 MED ORDER — PROPOFOL 10 MG/ML IV BOLUS
INTRAVENOUS | Status: DC | PRN
  Administered 2014-10-08: 30 mg via INTRAVENOUS

## 2014-10-08 MED ORDER — MIDAZOLAM HCL 2 MG/2ML IJ SOLN: 1.0000 mg | INTRAMUSCULAR | Status: DC | PRN

## 2014-10-08 MED ORDER — HYDROCODONE-ACETAMINOPHEN 5-325 MG PO TABS: 1.0000 | ORAL_TABLET | Freq: Four times a day (QID) | ORAL | Status: DC | PRN

## 2014-10-08 MED ORDER — LIDOCAINE HCL (PF) 0.5 % IJ SOLN
INTRAMUSCULAR | Status: DC | PRN
  Administered 2014-10-08: 30 mL via INTRAVENOUS

## 2014-10-08 MED ORDER — MIDAZOLAM HCL 5 MG/5ML IJ SOLN
INTRAMUSCULAR | Status: DC | PRN
  Administered 2014-10-08: 2 mg via INTRAVENOUS

## 2014-10-08 MED ORDER — DEXTROSE 50 % IV SOLN
INTRAVENOUS | Status: AC
  Filled 2014-10-08: qty 50

## 2014-10-08 MED ORDER — CEFAZOLIN SODIUM-DEXTROSE 2-3 GM-% IV SOLR: 2.0000 g | INTRAVENOUS | Status: DC

## 2014-10-08 MED ORDER — HYDROMORPHONE HCL 1 MG/ML IJ SOLN: 0.2500 mg | INTRAMUSCULAR | Status: DC | PRN

## 2014-10-08 MED ORDER — ONDANSETRON HCL 4 MG/2ML IJ SOLN
INTRAMUSCULAR | Status: DC | PRN
  Administered 2014-10-08: 4 mg via INTRAVENOUS

## 2014-10-08 MED ORDER — FENTANYL CITRATE 0.05 MG/ML IJ SOLN
INTRAMUSCULAR | Status: AC
  Filled 2014-10-08: qty 2

## 2014-10-08 MED ORDER — CHLORHEXIDINE GLUCONATE 4 % EX LIQD: 60.0000 mL | Freq: Once | CUTANEOUS | Status: DC

## 2014-10-08 MED ORDER — CEFAZOLIN SODIUM-DEXTROSE 2-3 GM-% IV SOLR
2.0000 g | INTRAVENOUS | Status: AC
  Administered 2014-10-08: 2 g via INTRAVENOUS

## 2014-10-08 MED ORDER — CHLORHEXIDINE GLUCONATE 4 % EX LIQD
60.0000 mL | Freq: Once | CUTANEOUS | Status: DC
Start: 2014-10-08 — End: 2014-10-08

## 2014-10-08 MED ORDER — BUPIVACAINE HCL (PF) 0.25 % IJ SOLN
INTRAMUSCULAR | Status: DC | PRN
  Administered 2014-10-08: 8 mL

## 2014-10-08 SURGICAL SUPPLY — 39 items
BLADE SURG 15 STRL LF DISP TIS (BLADE) ×1 IMPLANT
BLADE SURG 15 STRL SS (BLADE) ×2
BNDG CMPR 9X4 STRL LF SNTH (GAUZE/BANDAGES/DRESSINGS)
BNDG COHESIVE 3X5 TAN STRL LF (GAUZE/BANDAGES/DRESSINGS) ×2 IMPLANT
BNDG ESMARK 4X9 LF (GAUZE/BANDAGES/DRESSINGS) IMPLANT
BNDG GAUZE ELAST 4 BULKY (GAUZE/BANDAGES/DRESSINGS) ×2 IMPLANT
CHLORAPREP W/TINT 26ML (MISCELLANEOUS) ×2 IMPLANT
CORDS BIPOLAR (ELECTRODE) ×2 IMPLANT
COVER BACK TABLE 60X90IN (DRAPES) ×2 IMPLANT
COVER MAYO STAND STRL (DRAPES) ×2 IMPLANT
CUFF TOURNIQUET SINGLE 18IN (TOURNIQUET CUFF) ×2 IMPLANT
DRAPE EXTREMITY T 121X128X90 (DRAPE) ×2 IMPLANT
DRAPE SURG 17X23 STRL (DRAPES) ×2 IMPLANT
DRSG PAD ABDOMINAL 8X10 ST (GAUZE/BANDAGES/DRESSINGS) ×2 IMPLANT
GAUZE SPONGE 4X4 12PLY STRL (GAUZE/BANDAGES/DRESSINGS) ×2 IMPLANT
GAUZE XEROFORM 1X8 LF (GAUZE/BANDAGES/DRESSINGS) ×2 IMPLANT
GLOVE BIOGEL M STRL SZ7.5 (GLOVE) ×1 IMPLANT
GLOVE BIOGEL PI IND STRL 7.5 (GLOVE) IMPLANT
GLOVE BIOGEL PI IND STRL 8 (GLOVE) IMPLANT
GLOVE BIOGEL PI IND STRL 8.5 (GLOVE) ×1 IMPLANT
GLOVE BIOGEL PI INDICATOR 7.5 (GLOVE) ×1
GLOVE BIOGEL PI INDICATOR 8 (GLOVE) ×1
GLOVE BIOGEL PI INDICATOR 8.5 (GLOVE) ×1
GLOVE SURG ORTHO 8.0 STRL STRW (GLOVE) ×2 IMPLANT
GLOVE SURG SS PI 7.0 STRL IVOR (GLOVE) ×1 IMPLANT
GOWN STRL REUS W/ TWL LRG LVL3 (GOWN DISPOSABLE) ×1 IMPLANT
GOWN STRL REUS W/ TWL XL LVL3 (GOWN DISPOSABLE) IMPLANT
GOWN STRL REUS W/TWL LRG LVL3 (GOWN DISPOSABLE) ×2
GOWN STRL REUS W/TWL XL LVL3 (GOWN DISPOSABLE) ×4 IMPLANT
NDL PRECISIONGLIDE 27X1.5 (NEEDLE) IMPLANT
NEEDLE PRECISIONGLIDE 27X1.5 (NEEDLE) ×2 IMPLANT
NS IRRIG 1000ML POUR BTL (IV SOLUTION) ×2 IMPLANT
PACK BASIN DAY SURGERY FS (CUSTOM PROCEDURE TRAY) ×2 IMPLANT
STOCKINETTE 4X48 STRL (DRAPES) ×2 IMPLANT
SUT VICRYL 4-0 PS2 18IN ABS (SUTURE) IMPLANT
SYR BULB 3OZ (MISCELLANEOUS) ×2 IMPLANT
SYR CONTROL 10ML LL (SYRINGE) IMPLANT
TOWEL OR 17X24 6PK STRL BLUE (TOWEL DISPOSABLE) ×2 IMPLANT
UNDERPAD 30X30 INCONTINENT (UNDERPADS AND DIAPERS) ×2 IMPLANT

## 2014-10-08 NOTE — Anesthesia Preprocedure Evaluation (Signed)
Anesthesia Evaluation  Patient identified by MRN, date of birth, ID band Patient awake    Reviewed: Allergy & Precautions  Airway Mallampati: II       Dental   Pulmonary sleep apnea ,          Cardiovascular hypertension, Rhythm:Regular     Neuro/Psych    GI/Hepatic Neg liver ROS, GERD-  ,  Endo/Other  diabetes  Renal/GU      Musculoskeletal   Abdominal   Peds  Hematology   Anesthesia Other Findings   Reproductive/Obstetrics                             Anesthesia Physical Anesthesia Plan  ASA: III  Anesthesia Plan: MAC and Regional   Post-op Pain Management:    Induction: Intravenous  Airway Management Planned: Simple Face Mask  Additional Equipment:   Intra-op Plan:   Post-operative Plan:   Informed Consent: I have reviewed the patients History and Physical, chart, labs and discussed the procedure including the risks, benefits and alternatives for the proposed anesthesia with the patient or authorized representative who has indicated his/her understanding and acceptance.   Dental advisory given  Plan Discussed with: CRNA and Anesthesiologist  Anesthesia Plan Comments:         Anesthesia Quick Evaluation

## 2014-10-08 NOTE — Brief Op Note (Signed)
10/08/2014  9:56 AM  PATIENT:  Amy [name]  59 y.o. female  PRE-OPERATIVE DIAGNOSIS:  RIGHT CARPAL TUNNEL SYNDROME  POST-OPERATIVE DIAGNOSIS:  right carpal tunnel syndrome  PROCEDURE:  Procedure(s): RIGHT CARPAL TUNNEL RELEASE (Right)  SURGEON:  Surgeon(s) and Role:    * Daryll Brod, MD - Primary  PHYSICIAN ASSISTANT:   ASSISTANTS: none   ANESTHESIA:   local and regional  EBL:  Total I/O In: 700 [I.V.:700] Out: -   BLOOD ADMINISTERED:none  DRAINS: none   LOCAL MEDICATIONS USED:  BUPIVICAINE   SPECIMEN:  No Specimen  DISPOSITION OF SPECIMEN:  N/A  COUNTS:  YES  TOURNIQUET:   Total Tourniquet Time Documented: Forearm (Right) - 19 minutes Total: Forearm (Right) - 19 minutes   DICTATION: .Other Dictation: Dictation Number [id number]  PLAN OF CARE: Discharge to home after PACU  PATIENT DISPOSITION:  PACU - hemodynamically stable.

## 2014-10-08 NOTE — Discharge Instructions (Addendum)
[facility] Instructions °Hand Surgery ° °Wound Care: °Keep your hand elevated above the level of your heart.  Do not allow it to dangle by your side.  Keep the dressing dry and do not remove it unless your doctor advises you to do so.  He will usually change it at the time of your post-op visit.  Moving your fingers is advised to stimulate circulation but will depend on the site of your surgery.  If you have a splint applied, your doctor will advise you regarding movement. ° °Activity: °Do not drive or operate machinery today.  Rest today and then you may return to your normal activity and work as indicated by your physician. ° °Diet:  °Drink liquids today or eat a light diet.  You may resume a regular diet tomorrow.   ° °General expectations: °Pain for two to three days. °Fingers may become slightly swollen. ° °Call your doctor if any of the following occur: °Severe pain not relieved by pain medication. °Elevated temperature. °Dressing soaked with blood. °Inability to move fingers. °White or bluish color to fingers. ° ° ° °Post Anesthesia Home Care Instructions ° °Activity: °Get plenty of rest for the remainder of the day. A responsible adult should stay with you for 24 hours following the procedure.  °For the next 24 hours, DO NOT: °-Drive a car °-Operate machinery °-Drink alcoholic beverages °-Take any medication unless instructed by your physician °-Make any legal decisions or sign important papers. ° °Meals: °Start with liquid foods such as gelatin or soup. Progress to regular foods as tolerated. Avoid greasy, spicy, heavy foods. If nausea and/or vomiting occur, drink only clear liquids until the nausea and/or vomiting subsides. Call your physician if vomiting continues. ° °Special Instructions/Symptoms: °Your throat may feel dry or sore from the anesthesia or the breathing tube placed in your throat during surgery. If this causes discomfort, gargle with warm salt water. The discomfort should disappear within  24 hours. ° °Call your surgeon if you experience:  ° °1.  Fever over 101.0. °2.  Inability to urinate. °3.  Nausea and/or vomiting. °4.  Extreme swelling or bruising at the surgical site. °5.  Continued bleeding from the incision. °6.  Increased pain, redness or drainage from the incision. °7.  Problems related to your pain medication. °8. Any change in color, movement and/or sensation °9. Any problems and/or concerns ° ° ° °

## 2014-10-08 NOTE — Anesthesia Postprocedure Evaluation (Signed)
  Anesthesia Post-op Note  Patient: Amy [name]  Procedure(s) Performed: Procedure(s): RIGHT CARPAL TUNNEL RELEASE (Right)  Patient Location: PACU  Anesthesia Type:MAC  Level of Consciousness: awake  Airway and Oxygen Therapy: Patient Spontanous Breathing  Post-op Pain: mild  Post-op Assessment: Post-op Vital signs reviewed  Post-op Vital Signs: Reviewed  Last Vitals:  Filed Vitals:   10/08/14 1000  BP:   Pulse: 81  Temp:   Resp: 9    Complications: No apparent anesthesia complications

## 2014-10-08 NOTE — Transfer of Care (Signed)
Immediate Anesthesia Transfer of Care Note  Patient: Amy [name]  Procedure(s) Performed: Procedure(s): RIGHT CARPAL TUNNEL RELEASE (Right)  Patient Location: PACU  Anesthesia Type:MAC and Regional  Level of Consciousness: sedated  Airway & Oxygen Therapy: Patient Spontanous Breathing and Patient connected to face mask oxygen  Post-op Assessment: Report given to RN and Post -op Vital signs reviewed and stable  Post vital signs: Reviewed and stable  Last Vitals:  Filed Vitals:   10/08/14 0741  BP: 118/65  Pulse: 87  Temp: 36.8 C  Resp: 16    Complications: No apparent anesthesia complications

## 2014-10-08 NOTE — Op Note (Signed)
Dictation Number [id number]

## 2014-10-08 NOTE — Progress Notes (Signed)
Dr. Oletta Lamas notified of patients 1130 glucose level.  Patient instructed to notify her primary care physcian about low blood sugars that occurred during this hospitalization.

## 2014-10-08 NOTE — H&P (Signed)
Amy [name] is a 59 year old right hand dominant female referred by Dr. Harlan Stains for a consultation with respect to bilateral hand pain, numbness and tingling to all fingers, right approximately slightly greater than left. She has fairly persistent numbness and tingling. She has diabetes with peripheral neuropathy. She has had symptoms for 4 years. She is awakened 7 out of 7 nights. She has a prior history of fracture of her right wrist treated in 1996 with a cast. She has no history of injury to her neck. She has been taking Aleve 2 a day with minimal relief. No history of thyroid problems, arthritis or gout. She complains of intermittent, extremely severe, aching numbness with pain and getting worse. Work makes this worse. 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 .Nerve conductions performed by Dr Zebedee Iba show bilateral carpal tunnel syndromes.  She states the right side has gotten bad. She would like to have this surgically released.  PAST MEDICAL HISTORY: She is allergic to Altace. She is on the following medications: Aspirin, Vytorin, Furosemide, Zyrtec, Micardis, K-Dur, gabapentin, Imipramine, Remeron, Seroquel, Adderall, Nexium, Invokana, Metformin, Lantus, Humalog, Celebrex, Flonase, vitamin D, Pristiq, Ferrex, and Triamcinolone cream. She relates no surgical interventions.   FAMILY H ISTORY: Positive for diabetes, heart disease, high BP, and arthritis.  SOCIAL HISTORY: She does not smoke or drink.   REVIEW OF SYSTEMS: Positive for glasses, high BP, depression, nervousness, sleep disorder, easy bruising, otherwise negative for 14 points. Amy [name] is an 59 y.o. female.   Chief Complaint: CTS right HPI: see above  Past Medical History  Diagnosis Date  . Depression   . Dyslipidemia   . Bipolar disorder   . Vitamin D deficiency   . GERD (gastroesophageal reflux disease)   . Anemia   . Diabetic neuropathy     feet  . Osteoarthritis     knees  . ADHD  (attention deficit hyperactivity disorder)   . Anxiety   . Hypertension     states under control with med., has been on med. x 20 yrs.  Marland Kitchen SLEEP APNEA     uses CPAP nightly  . Insulin dependent diabetes mellitus   . Carpal tunnel syndrome of right wrist 09/2014  . Cataract, immature     Past Surgical History  Procedure Laterality Date  . Laparoscopic gastric banding  12/05/2005  . Esophagogastroduodenoscopy  02/03/2004  . Colonoscopy with propofol  10/27/2013  . Breast reduction surgery Bilateral     Family History  Problem Relation Age of Onset  . Heart disease Father   . Breast cancer Sister   . Diabetes Sister   . Colon cancer Neg Hx   . Sarcoidosis Brother    Social History:  reports that she has never smoked. She has never used smokeless tobacco. She reports that she does not drink alcohol or use illicit drugs.  Allergies: No Known Allergies  Medications Prior to Admission  Medication Sig Dispense Refill  . amphetamine-dextroamphetamine (ADDERALL) 15 MG tablet Take 60 mg by mouth daily.     Marland Kitchen aspirin 81 MG tablet Take 81 mg by mouth daily.     . canagliflozin (INVOKANA) 100 MG TABS tablet Take 1 tablet (100 mg total) by mouth daily. 90 tablet 1  . celecoxib (CELEBREX) 200 MG capsule Take 200 mg by mouth daily.    . cetirizine (ZYRTEC) 10 MG tablet Take 10 mg by mouth daily as needed for allergies.    . Clobetasol Propionate 0.05 % lotion     .  ergocalciferol (VITAMIN D2) 50000 UNITS capsule Take 50,000 Units by mouth once a week.      . esomeprazole (NEXIUM) 40 MG capsule Take 40 mg by mouth daily before breakfast.    . ezetimibe-simvastatin (VYTORIN) 10-80 MG per tablet Take 1 tablet by mouth at bedtime.     Marland Kitchen FLUOCINOLONE ACETONIDE SCALP 0.01 % OIL     . furosemide (LASIX) 80 MG tablet Take 80 mg by mouth daily.     Marland Kitchen gabapentin (NEURONTIN) 300 MG capsule Take 2 capsules (600 mg total) by mouth 3 (three) times daily. (Patient taking differently: Take 1,200 mg by mouth 3  (three) times daily. ) 540 capsule 1  . Hydroquinone-Sunscreens (NUQUIN HP) 4 % CREA     . imipramine (TOFRANIL) 50 MG tablet Take 150 mg by mouth at bedtime.     . Insulin Glargine (LANTUS SOLOSTAR) 100 UNIT/ML Solostar Pen Inject 26 Units into the skin at bedtime. (Patient taking differently: Inject 20 Units into the skin at bedtime. ) 15 pen 1  . insulin lispro (HUMALOG) 100 UNIT/ML KiwkPen Inject 10 Units into the skin 3 (three) times daily.     Marland Kitchen KLOR-CON M20 20 MEQ tablet Take 40 mEq by mouth daily.     . metFORMIN (GLUCOPHAGE) 1000 MG tablet Take 1 tablet (1,000 mg total) by mouth 2 (two) times daily with a meal. 180 tablet 1  . mirtazapine (REMERON) 15 MG tablet Take 15 mg by mouth at bedtime.    . Polysaccharide Iron Complex (FERREX 150 PO) Take 150 mg by mouth daily.    . QUEtiapine (SEROQUEL) 50 MG tablet Take 100 mg by mouth at bedtime.     Marland Kitchen telmisartan (MICARDIS) 80 MG tablet Take 80 mg by mouth daily.     Marland Kitchen tiZANidine (ZANAFLEX) 2 MG tablet Take 2 mg by mouth at bedtime.    . triamcinolone (NASACORT) 55 MCG/ACT AERO nasal inhaler Place 2 sprays into the nose daily.    Marland Kitchen venlafaxine (EFFEXOR) 75 MG tablet Take 300 mg by mouth daily.    Marland Kitchen glucose blood (ACCU-CHEK SMARTVIEW) test strip Use as instructed, dx code 250.00 300 each 2  . Insulin Pen Needle (B-D UF III MINI PEN NEEDLES) 31G X 5 MM MISC Use 4 per day 400 each 1  . Lancets (ACCU-CHEK SOFT TOUCH) lancets Use as instructed to check blood sugars daily dx 250.01 100 each 1    No results found for this or any previous visit (from the past 48 hour(s)).  No results found.   Pertinent items are noted in HPI.  Height 5\' 4"  (1.626 m), weight 68.04 kg (150 lb).  General appearance: alert, cooperative and appears stated age Head: Normocephalic, without obvious abnormality Neck: no JVD Resp: clear to auscultation bilaterally Cardio: regular rate and rhythm, S1, S2 normal, no murmur, click, rub or gallop GI: soft, non-tender;  bowel sounds normal; no masses,  no organomegaly Extremities: numbness right fingers median nerve distribution Pulses: 2+ and symmetric Skin: Skin color, texture, turgor normal. No rashes or lesions Neurologic: Grossly normal Incision/Wound: na  Assessment/Plan X-rays of her hands reveal mild degenerative changes at the DIP joint, otherwise essentially negative.  Diagnosis: Generalized arthritis. Peripheral neuropathy with bilateral carpal tunnel syndrome.  Pre, peri and post op care are discussed along with risks and complications. Patient is aware there is no guarantee with surgery, possibility of infection, injury to arteries, nerves, and tendons, incomplete relief and dystrophy.   She is scheduled for right carpal tunnel  release as an outpatient under regional anesthesia at her convenience.  Deya Bigos R 10/08/2014, 7:42 AM

## 2014-10-09 NOTE — Op Note (Signed)
[name], Amy                ACCOUNT NO.:  [id number]  MEDICAL RECORD NO.:  [id number]  LOCATION:                                 FACILITY:  PHYSICIAN:  Daryll Brod, M.D.            DATE OF BIRTH:  DATE OF PROCEDURE:  10/08/2014 DATE OF DISCHARGE:                              OPERATIVE REPORT   PREOPERATIVE DIAGNOSIS:  Carpal tunnel syndrome, right hand.  POSTOPERATIVE DIAGNOSIS:  Carpal tunnel syndrome, right hand.  OPERATION:  Decompression of right median nerve.  SURGEON:  Daryll Brod, M.D.  ANESTHESIA:  Forearm-based IV regional with local infiltration.  ANESTHESIOLOGIST:  Finis Bud, M.D.  HISTORY:  The patient is a 59 year old female with a history of carpal tunnel syndrome.  Nerve conduction is positive, this has not responded to conservative treatment.  She has elected to undergo surgical decompression.  Pre, peri, and postoperative course have been discussed along with risks and complications.  She is aware that there is no guarantee with the surgery; possibility of infection; recurrence of injury to arteries, nerves, tendons; incomplete release of symptoms and dystrophy.  In the preoperative area, the patient is seen, the extremity marked by both patient and surgeon, and antibiotic given.  PROCEDURE IN DETAIL:  The patient was brought to the operating room where forearm-based IV regional anesthetic was carried out without difficulty.  She was prepped using ChloraPrep, supine position, right arm free.  A 3-minute dry time was allowed.  Time-out taken, confirming the patient and procedure.  A longitudinal incision was made in the right palm, carried down through the subcutaneous tissue.  Bleeders were electrocauterized.  The palmar fascia was split.  Superficial palmar arch identified.  The flexor tendon to the ring and little finger identified to the ulnar side of the median nerve.  The carpal retinaculum was incised with sharp dissection.  Right angle  and Sewall retractor were placed between the skin and forearm fascia.  The fascia was released for approximately 2 cm proximal to the wrist crease under direct vision.  Canal was explored.  Motor branch entered into the muscle.  The area of compression to the nerve was apparent.  Moderate fibrosis was present about the nerve.  The area was then copiously irrigated with saline and skin was closed with interrupted 4-0 nylon sutures.  Local infiltration with 0.25% bupivacaine without epinephrine was given, approximately 8 mL was used.  A sterile compressive dressing with the fingers free was applied.  On deflation of the tourniquet, all fingers were immediately pinked.  She was taken to the recovery room for observation in satisfactory condition.  She will be discharged to home to return to the [facility] in 1 week, on Vicodin.          ______________________________ Daryll Brod, M.D.     GK/MEDQ  D:  10/08/2014  T:  10/09/2014  Job:  [id number]

## 2014-10-12 ENCOUNTER — Encounter (HOSPITAL_BASED_OUTPATIENT_CLINIC_OR_DEPARTMENT_OTHER): Payer: Self-pay | Admitting: Orthopedic Surgery

## 2014-10-21 ENCOUNTER — Telehealth: Payer: Self-pay | Admitting: Endocrinology

## 2014-10-21 NOTE — Telephone Encounter (Signed)
Denied pt needs appointment first.

## 2014-10-21 NOTE — Telephone Encounter (Signed)
Patient need refill of Lantus and Invokana 90 day supply [facility] [phone]

## 2014-10-22 NOTE — Telephone Encounter (Signed)
Pt scheduled for 10/23/2014 at 11 am.

## 2014-10-23 ENCOUNTER — Encounter: Payer: Self-pay | Admitting: Endocrinology

## 2014-10-23 ENCOUNTER — Ambulatory Visit (INDEPENDENT_AMBULATORY_CARE_PROVIDER_SITE_OTHER): Payer: Federal, State, Local not specified - PPO | Admitting: Endocrinology

## 2014-10-23 VITALS — BP 102/62 | HR 80 | Temp 98.9°F | Wt 150.0 lb

## 2014-10-23 DIAGNOSIS — E1140 Type 2 diabetes mellitus with diabetic neuropathy, unspecified: Secondary | ICD-10-CM

## 2014-10-23 DIAGNOSIS — E1165 Type 2 diabetes mellitus with hyperglycemia: Secondary | ICD-10-CM

## 2014-10-23 DIAGNOSIS — R634 Abnormal weight loss: Secondary | ICD-10-CM

## 2014-10-23 DIAGNOSIS — E1149 Type 2 diabetes mellitus with other diabetic neurological complication: Secondary | ICD-10-CM

## 2014-10-23 DIAGNOSIS — IMO0002 Reserved for concepts with insufficient information to code with codable children: Secondary | ICD-10-CM

## 2014-10-23 LAB — BASIC METABOLIC PANEL
BUN: 13 mg/dL (ref 6–23)
CALCIUM: 9.3 mg/dL (ref 8.4–10.5)
CO2: 33 meq/L — AB (ref 19–32)
Chloride: 102 mEq/L (ref 96–112)
Creatinine, Ser: 0.84 mg/dL (ref 0.40–1.20)
GFR: 89.89 mL/min (ref >60.00)
Glucose, Bld: 102 mg/dL — ABNORMAL HIGH (ref 70–99)
Potassium: 4.8 mEq/L (ref 3.5–5.1)
Sodium: 137 mEq/L (ref 135–145)

## 2014-10-23 LAB — TSH: TSH: 1.53 u[IU]/mL (ref 0.35–4.50)

## 2014-10-23 MED ORDER — GABAPENTIN 600 MG PO TABS: ORAL_TABLET | ORAL | Status: DC

## 2014-10-23 MED ORDER — CANAGLIFLOZIN 100 MG PO TABS: 100.0000 mg | ORAL_TABLET | Freq: Every day | ORAL | Status: DC

## 2014-10-23 MED ORDER — INSULIN GLARGINE 100 UNIT/ML SOLOSTAR PEN: 26.0000 [IU] | PEN_INJECTOR | Freq: Every day | SUBCUTANEOUS | Status: DC

## 2014-10-23 NOTE — Patient Instructions (Addendum)
Lantus 20 daily unless am sugar >140  Humalog 4-6 units based on meal size and carbs  Lasix 1/2 of 80mg  daily

## 2014-10-23 NOTE — Progress Notes (Signed)
Pre visit review using our clinic review tool, if applicable. No additional management support is needed unless otherwise documented below in the visit note. 

## 2014-10-23 NOTE — Progress Notes (Signed)
Patient ID: Amy [name], female   DOB: 01-06-1956, 59 y.o.   MRN: [id number]   Reason for Appointment:  Followup of diabetes  History of Present Illness   Diagnosis: Type 2 DIABETES MELITUS, date of diagnosis 1993   Prior history: Her diabetes has been treated for several years with basal bolus insulin and also metformin She usually has had A1c results near normal and is fairly compliant with her basal bolus regimen She continues to be on various atypical antipsychotic drugs also However her blood sugars were poorly controlled in 03/2013 Subsequently with a trial of Victoza he did not have much improvement in blood sugars and control of her increased appetite or weight gain  Recent history:  She has not been seen in follow-up since 01/2014. Has not had an A1c done recently Also is checking her blood sugar somewhat sporadically and not clear what her blood sugar patterns are However most of her sugars are looking fairly good She states that about a week or so ago she had a high blood sugar and she increase her Lantus from 20 up to 25 units even though her fasting blood sugars were not high; has only one fasting reading 3 weeks ago of 77  She continues to lower significant amount of weight which she thinks is from continuing [place] since 07/2013. Previously had  difficulty with worsening blood sugar control and weight gain No further side effects of candidiasis with Invokana  Insulin regimen: Humalog  breakfast 4-6 units, lunch 4-5; supper 4. Lantus insulin 25 units hs       Oral hypoglycemic drugs: Invokana 100 mg, metformin 1 g twice a day Proper timing of medications in relation to meals: Yes.                  Monitors blood glucose:  less than once a day     Glucometer:  Accu-Chek           Blood Glucose readings from download  PRE-MEAL Breakfast Lunch Dinner Bedtime Overall  Glucose range: 77 69-123 69-183 116, 162   Mean/median:  86   116   POST-MEAL PC Breakfast PC Lunch  PC Dinner  Glucose range:  166   Mean/median:      Diet: Usually low fat; Breakfast: yogurt Exercise regimen: Indoor bike 15 min at times  Wt Readings from Last 3 Encounters:  10/23/14 150 lb (68.04 kg)  10/08/14 147 lb (66.679 kg)  02/20/14 159 lb (72.122 kg)   Lab Results  Component Value Date   HGBA1C 6.9* 02/16/2014   HGBA1C 8.1* 11/17/2013   HGBA1C 8.9* 07/14/2013   Lab Results  Component Value Date   MICROALBUR 1.2 02/16/2014   LDLCALC 48 02/16/2014   CREATININE 0.92 10/05/2014    No visits with results within 1 Week(s) from this visit. Latest known visit with results is:  Admission on 10/08/2014, Discharged on 10/08/2014  Component Date Value Ref Range Status  . Sodium 10/05/2014 137  135 - 145 mmol/L Final  . Potassium 10/05/2014 3.7  3.5 - 5.1 mmol/L Final  . Chloride 10/05/2014 99  96 - 112 mmol/L Final  . CO2 10/05/2014 31  19 - 32 mmol/L Final  . Glucose, Bld 10/05/2014 166* 70 - 99 mg/dL Final  . BUN 10/05/2014 9  6 - 23 mg/dL Final  . Creatinine, Ser 10/05/2014 0.92  0.50 - 1.10 mg/dL Final  . Calcium 10/05/2014 9.0  8.4 - 10.5 mg/dL Final  . GFR calc non Af  Amer 10/05/2014 68* >90 mL/min Final  . GFR calc Af Amer 10/05/2014 79* >90 mL/min Final   Comment: (NOTE) The eGFR has been calculated using the CKD EPI equation. This calculation has not been validated in all clinical situations. eGFR's persistently <90 mL/min signify possible Chronic Kidney Disease.   . Anion gap 10/05/2014 7  5 - 15 Final  . Glucose-Capillary 10/08/2014 64* 70 - 99 mg/dL Final  . Hemoglobin 10/08/2014 12.5  12.0 - 15.0 g/dL Final  . Glucose-Capillary 10/08/2014 83  70 - 99 mg/dL Final  . Glucose-Capillary 10/08/2014 71  70 - 99 mg/dL Final  . Glucose-Capillary 10/08/2014 39* 70 - 99 mg/dL Final  . Comment 1 10/08/2014 Repeat Test   Final  . Glucose-Capillary 10/08/2014 59* 70 - 99 mg/dL Final  . Comment 1 10/08/2014 Call MD NNP PA CNM   Final  . Glucose-Capillary 10/08/2014  118* 70 - 99 mg/dL Final  . Comment 1 10/08/2014 Documented in [facility]   Final      Medication List       This list is accurate as of: 10/23/14 11:08 AM.  Always use your most recent med list.               accu-chek soft touch lancets  Use as instructed to check blood sugars daily dx 250.01     amphetamine-dextroamphetamine 15 MG tablet  Commonly known as:  ADDERALL  Take 60 mg by mouth daily.     aspirin 81 MG tablet  Take 81 mg by mouth daily.     canagliflozin 100 MG Tabs tablet  Commonly known as:  INVOKANA  Take 1 tablet (100 mg total) by mouth daily.     celecoxib 200 MG capsule  Commonly known as:  CELEBREX  Take 200 mg by mouth daily.     cetirizine 10 MG tablet  Commonly known as:  ZYRTEC  Take 10 mg by mouth daily as needed for allergies.     Clobetasol Propionate 0.05 % lotion     ergocalciferol 50000 UNITS capsule  Commonly known as:  VITAMIN D2  Take 50,000 Units by mouth once a week.     esomeprazole 40 MG capsule  Commonly known as:  NEXIUM  Take 40 mg by mouth daily before breakfast.     ezetimibe-simvastatin 10-80 MG per tablet  Commonly known as:  VYTORIN  Take 1 tablet by mouth at bedtime.     FERREX 150 PO  Take 150 mg by mouth daily.     FLUOCINOLONE ACETONIDE SCALP 0.01 % Oil     furosemide 80 MG tablet  Commonly known as:  LASIX  Take 80 mg by mouth daily.     gabapentin 300 MG capsule  Commonly known as:  NEURONTIN  Take 2 capsules (600 mg total) by mouth 3 (three) times daily.     glucose blood test strip  Commonly known as:  ACCU-CHEK SMARTVIEW  Use as instructed, dx code 250.00     HYDROcodone-acetaminophen 5-325 MG per tablet  Commonly known as:  NORCO  Take 1 tablet by mouth every 6 (six) hours as needed for moderate pain.     imipramine 50 MG tablet  Commonly known as:  TOFRANIL  Take 150 mg by mouth at bedtime.     Insulin Glargine 100 UNIT/ML Solostar Pen  Commonly known as:  LANTUS SOLOSTAR  Inject 26 Units  into the skin at bedtime.     insulin lispro 100 UNIT/ML KiwkPen  Commonly known  as:  HUMALOG  Inject 10 Units into the skin 3 (three) times daily.     Insulin Pen Needle 31G X 5 MM Misc  Commonly known as:  B-D UF III MINI PEN NEEDLES  Use 4 per day     KLOR-CON M20 20 MEQ tablet  Generic drug:  potassium chloride SA  Take 40 mEq by mouth daily.     metFORMIN 1000 MG tablet  Commonly known as:  GLUCOPHAGE  Take 1 tablet (1,000 mg total) by mouth 2 (two) times daily with a meal.     mirtazapine 15 MG tablet  Commonly known as:  REMERON  Take 15 mg by mouth at bedtime.     NUQUIN HP 4 % Crea     QUEtiapine 50 MG tablet  Commonly known as:  SEROQUEL  Take 100 mg by mouth at bedtime.     telmisartan 80 MG tablet  Commonly known as:  MICARDIS  Take 80 mg by mouth daily.     tiZANidine 2 MG tablet  Commonly known as:  ZANAFLEX  Take 2 mg by mouth at bedtime.     triamcinolone 55 MCG/ACT Aero nasal inhaler  Commonly known as:  NASACORT  Place 2 sprays into the nose daily.     venlafaxine 75 MG tablet  Commonly known as:  EFFEXOR  Take 300 mg by mouth daily.        Allergies: No Known Allergies  Past Medical History  Diagnosis Date  . Depression   . Dyslipidemia   . Bipolar disorder   . Vitamin D deficiency   . GERD (gastroesophageal reflux disease)   . Anemia   . Diabetic neuropathy     feet  . Osteoarthritis     knees  . ADHD (attention deficit hyperactivity disorder)   . Anxiety   . Hypertension     states under control with med., has been on med. x 20 yrs.  Marland Kitchen SLEEP APNEA     uses CPAP nightly  . Insulin dependent diabetes mellitus   . Carpal tunnel syndrome of right wrist 09/2014  . Cataract, immature     Past Surgical History  Procedure Laterality Date  . Laparoscopic gastric banding  12/05/2005  . Esophagogastroduodenoscopy  02/03/2004  . Colonoscopy with propofol  10/27/2013  . Breast reduction surgery Bilateral   . Carpal tunnel release Right  10/08/2014    Procedure: RIGHT CARPAL TUNNEL RELEASE;  Surgeon: Daryll Brod, MD;  Location: [place];  Service: Orthopedics;  Laterality: Right;    Family History  Problem Relation Age of Onset  . Heart disease Father   . Breast cancer Sister   . Diabetes Sister   . Colon cancer Neg Hx   . Sarcoidosis Brother     Social History:  reports that she has never smoked. She has never used smokeless tobacco. She reports that she does not drink alcohol or use illicit drugs.  Review of Systems:  She has a history of obesity, previously weight was 250, treated with a LAP-BAND.   Weight loss: As above she thinks it is related to her using Invokana, she thinks she has a normal appetite  HYPERTENSION:   This in mild and treated with Micardis by PCP, and is on 80 mg   Does not feel lightheaded even though blood pressure is low normal  HYPERLIPIDEMIA: The lipid abnormality consists of elevated LDL. Has been given Vytorin by PCP and LDL has been rather low, she has no history  of CAD  Lab Results  Component Value Date   LDLCALC 48 02/16/2014   No history of recent leg edema; she is on 80 mg Lasix despite asking her to take only 40 mg   Has some pain in legs and feet from neuropathy and is taking large doses of gabapentin; does have mild sensory loss on exam although her exam appears to be somewhat unreliable  Diabetic foot exam done in 2/16      Examination:   BP 102/62 mmHg  Pulse 80  Temp(Src) 98.9 F (37.2 C) (Oral)  Wt 150 lb (68.04 kg)  Body mass index is 25.73 kg/(m^2).   No pedal edema present  Diabetic foot exam shows Callus on the right heel , reduced monofilament sensation in feet distally but appears to have somewhat unreliable responses  in the toes and plantar surfaces, no skin lesions or ulcers on the feet and normal pedal pulses   ASSESSMENT/ PLAN:   Diabetes type 2   The patient's diabetes control is fair with recent readings mostly near desired  levels Has not checked blood sugar very frequently especially in the morning See history of present illness for detailed discussion of current management, blood sugar patterns and problems identified  She has increased her Lantus recently by 5 units even though her fasting readings are not high and she did not understand the actions of both basal and bolus insulins Although she has not had any hypoglycemia she is still taking overall relatively small doses of insulin with continuing  Invokana and is responding to only 100 mg  Continues to lose weight Does have sporadic high readings after meals and probably not getting enough insulin at lunchtime and other meals which have higher carbohydrate her fat content  For now will not change her basic insulin regimen except reduce Lantus and probably use more insulin for larger meals especially lunch and supper She will avoid high carbohydrate meals and have protein consistently and also restrict fat intake She can do better with glucose monitoring including more readings after dinner She will try to monitor more consistently 2 hours after various meals and at least fasting in the mornings on weekends when she has more time A1c to be checked today  Also emphasized need for regular follow-up  HYPERTENSION: The blood pressure is low normal Most likely this is the benefit of Invokana and advised her to cut her Lasix in half Will need followup with PCP for blood pressure  Hyperlipidemia: Her LDL has been relatively low and followed by PCP  Counseling time over 50% of today's 25 minute visit  Bayard More 10/23/2014, 11:08 AM

## 2014-10-26 ENCOUNTER — Telehealth: Payer: Self-pay

## 2014-10-26 NOTE — Telephone Encounter (Signed)
Error

## 2014-11-03 ENCOUNTER — Telehealth: Payer: Self-pay | Admitting: Endocrinology

## 2014-11-03 NOTE — Telephone Encounter (Signed)
Patient need refill of Invokana send to [facility] 90 day supply.

## 2014-11-04 ENCOUNTER — Other Ambulatory Visit: Payer: Self-pay | Admitting: *Deleted

## 2014-11-04 MED ORDER — CANAGLIFLOZIN 100 MG PO TABS: 100.0000 mg | ORAL_TABLET | Freq: Every day | ORAL | Status: DC

## 2014-11-04 NOTE — Telephone Encounter (Signed)
rx sent

## 2014-12-14 ENCOUNTER — Telehealth: Payer: Self-pay | Admitting: Endocrinology

## 2014-12-14 ENCOUNTER — Other Ambulatory Visit: Payer: Self-pay | Admitting: *Deleted

## 2014-12-14 MED ORDER — METFORMIN HCL 1000 MG PO TABS: 1000.0000 mg | ORAL_TABLET | Freq: Two times a day (BID) | ORAL | Status: DC

## 2014-12-14 NOTE — Telephone Encounter (Signed)
rx sent

## 2014-12-14 NOTE — Telephone Encounter (Signed)
Patient need a refill of metformin 90 supply

## 2014-12-18 ENCOUNTER — Other Ambulatory Visit (INDEPENDENT_AMBULATORY_CARE_PROVIDER_SITE_OTHER): Payer: Federal, State, Local not specified - PPO

## 2014-12-18 DIAGNOSIS — E1165 Type 2 diabetes mellitus with hyperglycemia: Secondary | ICD-10-CM | POA: Diagnosis not present

## 2014-12-18 DIAGNOSIS — IMO0002 Reserved for concepts with insufficient information to code with codable children: Secondary | ICD-10-CM

## 2014-12-18 LAB — LIPID PANEL
Cholesterol: 83 mg/dL (ref 0–200)
HDL: 44.3 mg/dL (ref >39.00)
LDL CALC: 31 mg/dL (ref 0–99)
NONHDL: 38.7
TRIGLYCERIDES: 38 mg/dL (ref 0.0–149.0)
Total CHOL/HDL Ratio: 2
VLDL: 7.6 mg/dL (ref 0.0–40.0)

## 2014-12-18 LAB — HEMOGLOBIN A1C: Hgb A1c MFr Bld: 6.8 % — ABNORMAL HIGH (ref 4.6–6.5)

## 2014-12-22 ENCOUNTER — Ambulatory Visit: Payer: Federal, State, Local not specified - PPO | Admitting: Endocrinology

## 2014-12-23 ENCOUNTER — Other Ambulatory Visit: Payer: Federal, State, Local not specified - PPO

## 2014-12-25 ENCOUNTER — Other Ambulatory Visit: Payer: Federal, State, Local not specified - PPO

## 2014-12-28 ENCOUNTER — Ambulatory Visit (INDEPENDENT_AMBULATORY_CARE_PROVIDER_SITE_OTHER): Payer: Federal, State, Local not specified - PPO | Admitting: Endocrinology

## 2014-12-28 ENCOUNTER — Encounter: Payer: Self-pay | Admitting: Endocrinology

## 2014-12-28 VITALS — BP 100/60 | HR 102 | Temp 99.1°F | Resp 12 | Wt 150.0 lb

## 2014-12-28 DIAGNOSIS — I952 Hypotension due to drugs: Secondary | ICD-10-CM

## 2014-12-28 DIAGNOSIS — E1165 Type 2 diabetes mellitus with hyperglycemia: Secondary | ICD-10-CM | POA: Diagnosis not present

## 2014-12-28 DIAGNOSIS — E1140 Type 2 diabetes mellitus with diabetic neuropathy, unspecified: Secondary | ICD-10-CM

## 2014-12-28 DIAGNOSIS — IMO0002 Reserved for concepts with insufficient information to code with codable children: Secondary | ICD-10-CM

## 2014-12-28 NOTE — Patient Instructions (Addendum)
Stop Micardis   Take fluid pill only as needed for swelling  Take 5-6 Humalog at supper  Reduce Lantus to 18

## 2014-12-28 NOTE — Progress Notes (Signed)
Patient ID: Amy [name], female   DOB: Jan 21, 1956, 59 y.o.   MRN: [id number]   Reason for Appointment:  Followup of diabetes  History of Present Illness   Diagnosis: Type 2 DIABETES MELITUS, date of diagnosis 1993   Prior history: Her diabetes has been treated for several years with basal bolus insulin and also metformin She usually has had A1c results near normal and is fairly compliant with her basal bolus regimen She continues to be on various atypical antipsychotic drugs also However her blood sugars were poorly controlled in 03/2013 Subsequently with a trial of Victoza she did not have much improvement in blood sugars and control of her increased appetite or weight gain  Recent history:  She is here for her periodic follow-up, did not have any A1c readings on her recent visit in 2/60 Also did not have many blood sugars on her previous visit and again have only a few readings She says she needed to replace her monitor last week and has not checked her blood sugar much Difficult to know what her blood sugar patterns are and she thinks blood sugars are periodically higher after supper but not other times Blood sugars in the mornings a low normal despite reducing her Lantus on the last visit Tolerating Invokana now, weight is stable Exercise: She is not doing any formal exercise recently and tries to stay active at work and a little walking otherwise Mealtime coverage: She is now taking mostly 4 units at each meal and does not adjust the dose based on what she is eating.  She thinks she takes the dose based only on the blood sugar before the meal which she does not check consistently She is trying to usually avoid a lot of snacks Only rare hypoglycemia Insulin regimen: Humalog  breakfast 4-6 units, lunch 4-5; supper 4. Lantus insulin 20 units hs       Oral hypoglycemic drugs: Invokana 100 mg, metformin 1 g twice a day  Proper timing of medications in relation to meals: Yes.                   Monitors blood glucose:  less than once a day     Glucometer:  Accu-Chek           Blood Glucose readings from download: Has only readings for 3 days With fasting readings 76-89, afternoon 62 and bedtime 179  Diet: Usually low fat; Breakfast: yogurt usually.  Does eat 3 meals a day   Wt Readings from Last 3 Encounters:  12/28/14 150 lb (68.04 kg)  10/23/14 150 lb (68.04 kg)  10/08/14 147 lb (66.679 kg)   Lab Results  Component Value Date   HGBA1C 6.8* 12/18/2014   HGBA1C 6.9* 02/16/2014   HGBA1C 8.1* 11/17/2013   Lab Results  Component Value Date   MICROALBUR 1.2 02/16/2014   LDLCALC 31 12/18/2014   CREATININE 0.84 10/23/2014    No visits with results within 1 Week(s) from this visit. Latest known visit with results is:  Lab on 12/18/2014  Component Date Value Ref Range Status  . Hgb A1c MFr Bld 12/18/2014 6.8* 4.6 - 6.5 % Final   Glycemic Control Guidelines for People with Diabetes:Non Diabetic:  <6%Goal of Therapy: <7%Additional Action Suggested:  >8%   . Cholesterol 12/18/2014 83  0 - 200 mg/dL Final   ATP III Classification       Desirable:  < 200 mg/dL  Borderline High:  200 - 239 mg/dL          High:  > = 240 mg/dL  . Triglycerides 12/18/2014 38.0  0.0 - 149.0 mg/dL Final   Normal:  <150 mg/dLBorderline High:  150 - 199 mg/dL  . HDL 12/18/2014 44.30  >39.00 mg/dL Final  . VLDL 12/18/2014 7.6  0.0 - 40.0 mg/dL Final  . LDL Cholesterol 12/18/2014 31  0 - 99 mg/dL Final  . Total CHOL/HDL Ratio 12/18/2014 2   Final                  Men          Women1/2 Average Risk     3.4          3.3Average Risk          5.0          4.42X Average Risk          9.6          7.13X Average Risk          15.0          11.0                      . NonHDL 12/18/2014 38.70   Final   NOTE:  Non-HDL goal should be 30 mg/dL higher than patient's LDL goal (i.e. LDL goal of < 70 mg/dL, would have non-HDL goal of < 100 mg/dL)      Medication List       This list is  accurate as of: 12/28/14  3:45 PM.  Always use your most recent med list.               accu-chek soft touch lancets  Use as instructed to check blood sugars daily dx 250.01     amphetamine-dextroamphetamine 15 MG tablet  Commonly known as:  ADDERALL  Take 60 mg by mouth daily.     aspirin 81 MG tablet  Take 81 mg by mouth daily.     canagliflozin 100 MG Tabs tablet  Commonly known as:  INVOKANA  Take 1 tablet (100 mg total) by mouth daily.     celecoxib 200 MG capsule  Commonly known as:  CELEBREX  Take 200 mg by mouth daily.     cetirizine 10 MG tablet  Commonly known as:  ZYRTEC  Take 10 mg by mouth daily as needed for allergies.     Clobetasol Propionate 0.05 % lotion     ergocalciferol 50000 UNITS capsule  Commonly known as:  VITAMIN D2  Take 50,000 Units by mouth once a week.     esomeprazole 40 MG capsule  Commonly known as:  NEXIUM  Take 40 mg by mouth daily before breakfast.     ezetimibe-simvastatin 10-80 MG per tablet  Commonly known as:  VYTORIN  Take 1 tablet by mouth at bedtime.     FERREX 150 PO  Take 150 mg by mouth daily.     FLUOCINOLONE ACETONIDE SCALP 0.01 % Oil     furosemide 80 MG tablet  Commonly known as:  LASIX  Take 80 mg by mouth daily.     gabapentin 600 MG tablet  Commonly known as:  NEURONTIN  2 tablets twice daily     glucose blood test strip  Commonly known as:  ACCU-CHEK SMARTVIEW  Use as instructed, dx code 250.00     HYDROcodone-acetaminophen 5-325 MG per tablet  Commonly known  as:  NORCO  Take 1 tablet by mouth every 6 (six) hours as needed for moderate pain.     imipramine 50 MG tablet  Commonly known as:  TOFRANIL  Take 150 mg by mouth at bedtime.     Insulin Glargine 100 UNIT/ML Solostar Pen  Commonly known as:  LANTUS SOLOSTAR  Inject 26 Units into the skin at bedtime.     insulin lispro 100 UNIT/ML KiwkPen  Commonly known as:  HUMALOG  Inject 10 Units into the skin 3 (three) times daily.     Insulin Pen  Needle 31G X 5 MM Misc  Commonly known as:  B-D UF III MINI PEN NEEDLES  Use 4 per day     KLOR-CON M20 20 MEQ tablet  Generic drug:  potassium chloride SA  Take 40 mEq by mouth daily.     metFORMIN 1000 MG tablet  Commonly known as:  GLUCOPHAGE  Take 1 tablet (1,000 mg total) by mouth 2 (two) times daily with a meal.     mirtazapine 15 MG tablet  Commonly known as:  REMERON  Take 15 mg by mouth at bedtime.     NUQUIN HP 4 % Crea     QUEtiapine 50 MG tablet  Commonly known as:  SEROQUEL  Take 100 mg by mouth at bedtime.     telmisartan 80 MG tablet  Commonly known as:  MICARDIS  Take 80 mg by mouth daily.     tiZANidine 2 MG tablet  Commonly known as:  ZANAFLEX  Take 2 mg by mouth at bedtime.     triamcinolone 55 MCG/ACT Aero nasal inhaler  Commonly known as:  NASACORT  Place 2 sprays into the nose daily.     venlafaxine 75 MG tablet  Commonly known as:  EFFEXOR  Take 300 mg by mouth daily.        Allergies: No Known Allergies  Past Medical History  Diagnosis Date  . Depression   . Dyslipidemia   . Bipolar disorder   . Vitamin D deficiency   . GERD (gastroesophageal reflux disease)   . Anemia   . Diabetic neuropathy     feet  . Osteoarthritis     knees  . ADHD (attention deficit hyperactivity disorder)   . Anxiety   . Hypertension     states under control with med., has been on med. x 20 yrs.  Marland Kitchen SLEEP APNEA     uses CPAP nightly  . Insulin dependent diabetes mellitus   . Carpal tunnel syndrome of right wrist 09/2014  . Cataract, immature     Past Surgical History  Procedure Laterality Date  . Laparoscopic gastric banding  12/05/2005  . Esophagogastroduodenoscopy  02/03/2004  . Colonoscopy with propofol  10/27/2013  . Breast reduction surgery Bilateral   . Carpal tunnel release Right 10/08/2014    Procedure: RIGHT CARPAL TUNNEL RELEASE;  Surgeon: Daryll Brod, MD;  Location: [facility];  Service: Orthopedics;  Laterality: Right;     Family History  Problem Relation Age of Onset  . Heart disease Father   . Breast cancer Sister   . Diabetes Sister   . Colon cancer Neg Hx   . Sarcoidosis Brother     Social History:  reports that she has never smoked. She has never used smokeless tobacco. She reports that she does not drink alcohol or use illicit drugs.  Review of Systems:  She has a history of obesity, previously weight was 250, treated with a  LAP-BAND.   Weight loss: This has leveled off.  Appetite is normal  HYPERTENSION:   This in mild and treated with Micardis by PCP, and is on 80 mg   Does not feel lightheaded even though blood pressure is significantly low standing up She has however reduced her Lasix to 40 mg as directed  HYPERLIPIDEMIA: The lipid abnormality consists of elevated LDL. Has been given Vytorin by PCP and LDL has been rather low, she has no history of CAD  Lab Results  Component Value Date   CHOL 83 12/18/2014   HDL 44.30 12/18/2014   LDLCALC 31 12/18/2014   TRIG 38.0 12/18/2014   CHOLHDL 2 12/18/2014    No history of recent leg edema; she is on 40 mg Lasix now  Has some pain in legs and feet from neuropathy and is taking large doses of gabapentin partly for depression; does have mild sensory loss on exam although her exam appears to be somewhat unreliable  Diabetic foot exam done in 2/16 shows Callus on the right heel , reduced monofilament sensation in feet distally but appears to have somewhat unreliable responses in the toes and plantar surfaces, no skin lesions or ulcers on the feet and normal pedal pulses     Examination:   BP 100/60 mmHg  Pulse 102  Temp(Src) 99.1 F (37.3 C) (Oral)  Resp 12  Wt 150 lb (68.04 kg)  SpO2 99%  Body mass index is 25.73 kg/(m^2).   No pedal edema present  Standing blood pressure 70/50 bilaterally  ASSESSMENT/ PLAN:   Diabetes type 2  See history of present illness for detailed discussion of current management, blood sugar patterns  and problems identified  The patient's diabetes control is fair with A1c upper normal She has managed to maintain her weight down especially with the help of Invokana which she is tolerating However difficult to analyze her readings as she has a new monitor since last week and does not remember her other readings Still requiring relatively low doses of insulin Most of her sugars are being checked recently in the morning and is a low normal As discussed in history of present illness she does need to adjust her mealtime dose based on what she is eating and patterns of postprandial readings Has not checked blood sugar after meals usually  For now will reduce her Lantus by 2 units She can do better with glucose monitoring including more readings after dinner She will probably need 5-6 units of Humalog for her evening meal unless she is eating small amount of carbohydrate Will review her monitoring in more detail on the next visit and discussed blood sugar targets  HYPERTENSION: The blood pressure very low today on standing up and this may be partly related to her taking a diuretic with Micardis is also possibly from autonomic neuropathy  Discussed her blood pressure reading can need to avoid hypotension.  She will stop both Micardis and Lasix until seen by PCP  Counseling time for subjects discussed above is over 50% of today's 25 minute visit  Kelan Pritt 12/28/2014, 3:45 PM

## 2015-01-18 ENCOUNTER — Other Ambulatory Visit (HOSPITAL_COMMUNITY): Payer: Self-pay | Admitting: Family Medicine

## 2015-01-18 DIAGNOSIS — Z1231 Encounter for screening mammogram for malignant neoplasm of breast: Secondary | ICD-10-CM

## 2015-01-22 ENCOUNTER — Other Ambulatory Visit: Payer: Self-pay | Admitting: *Deleted

## 2015-01-22 ENCOUNTER — Other Ambulatory Visit: Payer: Self-pay | Admitting: Endocrinology

## 2015-01-22 MED ORDER — CANAGLIFLOZIN 100 MG PO TABS: 100.0000 mg | ORAL_TABLET | Freq: Every day | ORAL | Status: DC

## 2015-01-22 NOTE — Telephone Encounter (Signed)
Pt would like for you to refill [facility]

## 2015-01-26 ENCOUNTER — Other Ambulatory Visit: Payer: Self-pay | Admitting: *Deleted

## 2015-01-26 MED ORDER — CANAGLIFLOZIN 100 MG PO TABS: 100.0000 mg | ORAL_TABLET | Freq: Every day | ORAL | Status: DC

## 2015-02-25 ENCOUNTER — Other Ambulatory Visit (HOSPITAL_COMMUNITY): Payer: Self-pay | Admitting: Family Medicine

## 2015-02-25 ENCOUNTER — Ambulatory Visit (HOSPITAL_COMMUNITY)
Admission: RE | Admit: 2015-02-25 | Discharge: 2015-02-25 | Disposition: A | Discharge status: Home / Self Care | Payer: Federal, State, Local not specified - PPO | Source: Ambulatory Visit | Attending: Family Medicine | Admitting: Family Medicine

## 2015-02-25 DIAGNOSIS — Z1231 Encounter for screening mammogram for malignant neoplasm of breast: Secondary | ICD-10-CM

## 2015-03-29 ENCOUNTER — Other Ambulatory Visit (INDEPENDENT_AMBULATORY_CARE_PROVIDER_SITE_OTHER): Payer: Federal, State, Local not specified - PPO

## 2015-03-29 DIAGNOSIS — E1165 Type 2 diabetes mellitus with hyperglycemia: Secondary | ICD-10-CM

## 2015-03-29 DIAGNOSIS — IMO0002 Reserved for concepts with insufficient information to code with codable children: Secondary | ICD-10-CM

## 2015-03-29 LAB — COMPREHENSIVE METABOLIC PANEL
ALT: 24 U/L (ref 0–35)
AST: 21 U/L (ref 0–37)
Albumin: 4 g/dL (ref 3.5–5.2)
Alkaline Phosphatase: 97 U/L (ref 39–117)
BILIRUBIN TOTAL: 0.3 mg/dL (ref 0.2–1.2)
BUN: 9 mg/dL (ref 6–23)
CALCIUM: 9.4 mg/dL (ref 8.4–10.5)
CHLORIDE: 101 meq/L (ref 96–112)
CO2: 32 mEq/L (ref 19–32)
CREATININE: 0.84 mg/dL (ref 0.40–1.20)
GFR: 89.75 mL/min (ref >60.00)
Glucose, Bld: 90 mg/dL (ref 70–99)
POTASSIUM: 3.8 meq/L (ref 3.5–5.1)
SODIUM: 139 meq/L (ref 135–145)
Total Protein: 6.9 g/dL (ref 6.0–8.3)

## 2015-03-29 LAB — HEMOGLOBIN A1C: Hgb A1c MFr Bld: 6.5 % (ref 4.6–6.5)

## 2015-04-01 ENCOUNTER — Encounter: Payer: Self-pay | Admitting: Endocrinology

## 2015-04-01 ENCOUNTER — Ambulatory Visit (INDEPENDENT_AMBULATORY_CARE_PROVIDER_SITE_OTHER): Payer: Federal, State, Local not specified - PPO | Admitting: Endocrinology

## 2015-04-01 VITALS — BP 116/72 | HR 96 | Temp 97.7°F | Resp 16 | Ht 65.0 in | Wt 147.0 lb

## 2015-04-01 DIAGNOSIS — E1140 Type 2 diabetes mellitus with diabetic neuropathy, unspecified: Secondary | ICD-10-CM | POA: Diagnosis not present

## 2015-04-01 NOTE — Progress Notes (Signed)
Patient ID: Amy [name], female   DOB: 01-Feb-1956, 59 y.o.   MRN: [id number]   Reason for Appointment:  Followup of diabetes  History of Present Illness   Diagnosis: Type 2 DIABETES MELITUS, date of diagnosis 1993   Prior history: Her diabetes has been treated for several years with basal bolus insulin and also metformin She usually has had A1c results near normal and is fairly compliant with her basal bolus regimen She continues to be on various atypical antipsychotic drugs also However her blood sugars were poorly controlled in 03/2013 Subsequently with a trial of Victoza she did not have much improvement in blood sugars and control of her increased appetite or weight gain Victoza was stopped in 2/15 when she was having excessive weight loss  Recent history: She is here for her periodic follow-up, currently on basal bolus insulin regimen along with Invokana 100 mg, metformin  Tolerating Invokana now, weight is slightly lower today  Current blood sugar patterns and problems identified:  She is checking her blood sugars mostly in the afternoon, not clear which readings are after eating  Her Lantus was reduced by 2 units previously and her fasting readings last month looked near-normal  Exercise: She is not doing any formal exercise recently and tries to stay active at work and a little walking otherwise  Mealtime coverage: She is now taking mostly 4 units at each meal and does not adjust the dose based on what she is eating.    She thinks postprandial readings are higher when she is eating foods like pasta or a lot of meat  She is trying to usually avoid a lot of snacks  Only rare hypoglycemia  Insulin regimen: Humalog  breakfast 4-6 units, lunch 4-5; supper 4. Lantus insulin 18 units hs       Oral hypoglycemic drugs: Invokana 100 mg, metformin 1 g twice a day  Proper timing of medications in relation to meals: Yes.                  Monitors blood glucose:  less than once a  day     Glucometer:  Accu-Chek           Blood Glucose readings from download:  Mean values apply above for all meters except median for One Touch  PRE-MEAL Fasting Lunch  6 PM  Bedtime Overall  Glucose range:  94, 99   108   115-224     Mean/median:      139    POST-MEAL PC Breakfast PC Lunch PC Dinner  Glucose range:   109-198   110-145   Mean/median:   135       Diet: Usually low fat; Breakfast: yogurt usually.  Does eat 3 meals a day, Lunch 1-2 pm dinner 6 Exercise bike 3/7  Wt Readings from Last 3 Encounters:  04/01/15 147 lb (66.679 kg)  12/28/14 150 lb (68.04 kg)  10/23/14 150 lb (68.04 kg)   Lab Results  Component Value Date   HGBA1C 6.5 03/29/2015   HGBA1C 6.8* 12/18/2014   HGBA1C 6.9* 02/16/2014   Lab Results  Component Value Date   MICROALBUR 1.2 02/16/2014   LDLCALC 31 12/18/2014   CREATININE 0.84 03/29/2015    Appointment on 03/29/2015  Component Date Value Ref Range Status  . Sodium 03/29/2015 139  135 - 145 mEq/L Final  . Potassium 03/29/2015 3.8  3.5 - 5.1 mEq/L Final  . Chloride 03/29/2015 101  96 - 112 mEq/L Final  .  CO2 03/29/2015 32  19 - 32 mEq/L Final  . Glucose, Bld 03/29/2015 90  70 - 99 mg/dL Final  . BUN 03/29/2015 9  6 - 23 mg/dL Final  . Creatinine, Ser 03/29/2015 0.84  0.40 - 1.20 mg/dL Final  . Total Bilirubin 03/29/2015 0.3  0.2 - 1.2 mg/dL Final  . Alkaline Phosphatase 03/29/2015 97  39 - 117 U/L Final  . AST 03/29/2015 21  0 - 37 U/L Final  . ALT 03/29/2015 24  0 - 35 U/L Final  . Total Protein 03/29/2015 6.9  6.0 - 8.3 g/dL Final  . Albumin 03/29/2015 4.0  3.5 - 5.2 g/dL Final  . Calcium 03/29/2015 9.4  8.4 - 10.5 mg/dL Final  . GFR 03/29/2015 89.75  >60.00 mL/min Final  . Hgb A1c MFr Bld 03/29/2015 6.5  4.6 - 6.5 % Final   Glycemic Control Guidelines for People with Diabetes:Non Diabetic:  <6%Goal of Therapy: <7%Additional Action Suggested:  >8%       Medication List       This list is accurate as of: 04/01/15 11:59 PM.   Always use your most recent med list.               accu-chek soft touch lancets  Use as instructed to check blood sugars daily dx 250.01     amphetamine-dextroamphetamine 15 MG tablet  Commonly known as:  ADDERALL  Take 60 mg by mouth daily.     aspirin 81 MG tablet  Take 81 mg by mouth daily.     canagliflozin 100 MG Tabs tablet  Commonly known as:  INVOKANA  Take 1 tablet (100 mg total) by mouth daily.     celecoxib 200 MG capsule  Commonly known as:  CELEBREX  Take 200 mg by mouth daily.     cetirizine 10 MG tablet  Commonly known as:  ZYRTEC  Take 10 mg by mouth daily as needed for allergies.     Clobetasol Propionate 0.05 % lotion     ergocalciferol 50000 UNITS capsule  Commonly known as:  VITAMIN D2  Take 50,000 Units by mouth once a week.     esomeprazole 40 MG capsule  Commonly known as:  NEXIUM  Take 40 mg by mouth daily before breakfast.     ezetimibe-simvastatin 10-80 MG per tablet  Commonly known as:  VYTORIN  Take 1 tablet by mouth at bedtime.     FERREX 150 PO  Take 150 mg by mouth daily.     FLUOCINOLONE ACETONIDE SCALP 0.01 % Oil     furosemide 80 MG tablet  Commonly known as:  LASIX  Take 80 mg by mouth daily.     gabapentin 600 MG tablet  Commonly known as:  NEURONTIN  2 tablets twice daily     glucose blood test strip  Commonly known as:  ACCU-CHEK SMARTVIEW  Use as instructed, dx code 250.00     HYDROcodone-acetaminophen 5-325 MG per tablet  Commonly known as:  NORCO  Take 1 tablet by mouth every 6 (six) hours as needed for moderate pain.     imipramine 50 MG tablet  Commonly known as:  TOFRANIL  Take 150 mg by mouth at bedtime.     Insulin Glargine 100 UNIT/ML Solostar Pen  Commonly known as:  LANTUS SOLOSTAR  Inject 26 Units into the skin at bedtime.     insulin lispro 100 UNIT/ML KiwkPen  Commonly known as:  HUMALOG  Inject 10 Units into the skin 3 (  three) times daily.     Insulin Pen Needle 31G X 5 MM Misc  Commonly  known as:  B-D UF III MINI PEN NEEDLES  Use 4 per day     KLOR-CON M20 20 MEQ tablet  Generic drug:  potassium chloride SA  Take 40 mEq by mouth daily.     metFORMIN 1000 MG tablet  Commonly known as:  GLUCOPHAGE  Take 1 tablet (1,000 mg total) by mouth 2 (two) times daily with a meal.     mirtazapine 15 MG tablet  Commonly known as:  REMERON  Take 15 mg by mouth at bedtime.     NUQUIN HP 4 % Crea     QUEtiapine 50 MG tablet  Commonly known as:  SEROQUEL  Take 100 mg by mouth at bedtime.     telmisartan 80 MG tablet  Commonly known as:  MICARDIS  Take 80 mg by mouth daily.     tiZANidine 2 MG tablet  Commonly known as:  ZANAFLEX  Take 2 mg by mouth at bedtime.     triamcinolone 55 MCG/ACT Aero nasal inhaler  Commonly known as:  NASACORT  Place 2 sprays into the nose daily.     venlafaxine 75 MG tablet  Commonly known as:  EFFEXOR  Take 300 mg by mouth daily.        Allergies: No Known Allergies  Past Medical History  Diagnosis Date  . Depression   . Dyslipidemia   . Bipolar disorder   . Vitamin D deficiency   . GERD (gastroesophageal reflux disease)   . Anemia   . Diabetic neuropathy     feet  . Osteoarthritis     knees  . ADHD (attention deficit hyperactivity disorder)   . Anxiety   . Hypertension     states under control with med., has been on med. x 20 yrs.  Marland Kitchen SLEEP APNEA     uses CPAP nightly  . Insulin dependent diabetes mellitus   . Carpal tunnel syndrome of right wrist 09/2014  . Cataract, immature     Past Surgical History  Procedure Laterality Date  . Laparoscopic gastric banding  12/05/2005  . Esophagogastroduodenoscopy  02/03/2004  . Colonoscopy with propofol  10/27/2013  . Breast reduction surgery Bilateral   . Carpal tunnel release Right 10/08/2014    Procedure: RIGHT CARPAL TUNNEL RELEASE;  Surgeon: Daryll Brod, MD;  Location: [facility];  Service: Orthopedics;  Laterality: Right;    Family History  Problem Relation  Age of Onset  . Heart disease Father   . Breast cancer Sister   . Diabetes Sister   . Colon cancer Neg Hx   . Sarcoidosis Brother     Social History:  reports that she has never smoked. She has never used smokeless tobacco. She reports that she does not drink alcohol or use illicit drugs.  Review of Systems:  She has a history of obesity, previously weight was 250, treated with a LAP-BAND.   HYPERTENSION:   This in mild and treated with Micardis by PCP, and is on 40 mg    She has however reduced her Lasix to 40 mg as directed on her last visit because of relatively low blood pressure   HYPERLIPIDEMIA: The lipid abnormality consists of elevated LDL. Has been given Vytorin by PCP and LDL has been rather low, she has no history of CAD  Lab Results  Component Value Date   CHOL 83 12/18/2014   HDL 44.30 12/18/2014  [place] 31 12/18/2014   TRIG 38.0 12/18/2014   CHOLHDL 2 12/18/2014    No history of recent leg edema; she is on 40 mg Lasix now  Has some pain in legs and feet from neuropathy and is taking large doses of gabapentin partly for depression; does have mild sensory loss on exam although her exam appears to be somewhat unreliable Pain present in big toes but mostly on walking  Diabetic foot exam done in 2/16 shows Callus on the right heel , reduced monofilament sensation in feet distally but appears to have somewhat unreliable responses in the toes and plantar surfaces, no skin lesions or ulcers on the feet and normal pedal pulses     Examination:   BP 116/72 mmHg  Pulse 96  Temp(Src) 97.7 F (36.5 C)  Resp 16  Ht 5\' 5"  (1.651 m)  Wt 147 lb (66.679 kg)  BMI 24.46 kg/m2  SpO2 97%  Body mass index is 24.46 kg/(m^2).   No pedal edema present   ASSESSMENT/ PLAN:   Diabetes type 2  See history of present illness for detailed discussion of current management, blood sugar patterns and problems identified  The patient's diabetes control is fair with A1c consistently  upper normal Blood sugars at home are looking excellent usually with only occasional high readings when she goes off her diet However her weight is down 3 pounds Overall fairly compliant with her regimen She has managed to maintain her weight down especially with the help of Invokana which she is tolerating   HYPERTENSION: The blood pressure is relatively good today and she will continue follow-up with PCP     [facility] 04/02/2015, 9:02 AM

## 2015-04-02 LAB — MICROALBUMIN / CREATININE URINE RATIO
CREATININE, U: 120.8 mg/dL
MICROALB/CREAT RATIO: 0.6 mg/g (ref 0.0–30.0)

## 2015-08-02 ENCOUNTER — Other Ambulatory Visit (INDEPENDENT_AMBULATORY_CARE_PROVIDER_SITE_OTHER): Payer: Federal, State, Local not specified - PPO

## 2015-08-02 DIAGNOSIS — E1140 Type 2 diabetes mellitus with diabetic neuropathy, unspecified: Secondary | ICD-10-CM

## 2015-08-02 LAB — COMPREHENSIVE METABOLIC PANEL
ALBUMIN: 3.9 g/dL (ref 3.5–5.2)
ALT: 12 U/L (ref 0–35)
AST: 13 U/L (ref 0–37)
Alkaline Phosphatase: 103 U/L (ref 39–117)
BUN: 10 mg/dL (ref 6–23)
CO2: 30 meq/L (ref 19–32)
CREATININE: 0.93 mg/dL (ref 0.40–1.20)
Calcium: 9.1 mg/dL (ref 8.4–10.5)
Chloride: 101 mEq/L (ref 96–112)
GFR: 79.71 mL/min (ref >60.00)
Glucose, Bld: 102 mg/dL — ABNORMAL HIGH (ref 70–99)
POTASSIUM: 3.7 meq/L (ref 3.5–5.1)
Sodium: 139 mEq/L (ref 135–145)
Total Bilirubin: 0.5 mg/dL (ref 0.2–1.2)
Total Protein: 6.8 g/dL (ref 6.0–8.3)

## 2015-08-02 LAB — HEMOGLOBIN A1C: Hgb A1c MFr Bld: 6.7 % — ABNORMAL HIGH (ref 4.6–6.5)

## 2015-08-05 ENCOUNTER — Encounter: Payer: Self-pay | Admitting: Endocrinology

## 2015-08-05 ENCOUNTER — Ambulatory Visit (INDEPENDENT_AMBULATORY_CARE_PROVIDER_SITE_OTHER): Payer: Federal, State, Local not specified - PPO | Admitting: Endocrinology

## 2015-08-05 VITALS — BP 108/68 | HR 85 | Temp 98.1°F | Resp 14 | Ht 65.0 in | Wt 150.0 lb

## 2015-08-05 DIAGNOSIS — Z794 Long term (current) use of insulin: Secondary | ICD-10-CM | POA: Diagnosis not present

## 2015-08-05 DIAGNOSIS — E1165 Type 2 diabetes mellitus with hyperglycemia: Secondary | ICD-10-CM

## 2015-08-05 DIAGNOSIS — E1140 Type 2 diabetes mellitus with diabetic neuropathy, unspecified: Secondary | ICD-10-CM

## 2015-08-05 NOTE — Patient Instructions (Signed)
Lantus 16 instead of 18  Take 4 Humalog if eating eggs in am   Check sugar when sluggish   Check blood sugars on waking up 2-3  times a week Also check blood sugars about 2 hours after a meal and do this after different meals by rotation  Recommended blood sugar levels on waking up is 90-130 and about 2 hours after meal is 130-160  Please bring your blood sugar monitor to each visit, thank you

## 2015-08-05 NOTE — Progress Notes (Signed)
Patient ID: Amy [name], female   DOB: [birth year], 59 y.o.   MRN: [id number]   Reason for Appointment:  Followup of diabetes  History of Present Illness   Diagnosis: Type 2 DIABETES MELITUS, date of diagnosis 1993   Prior history: Her diabetes has been treated for several years with basal bolus insulin and also metformin She usually has had A1c results near normal and is fairly compliant with her basal bolus regimen She continues to be on various atypical antipsychotic drugs also However her blood sugars were poorly controlled in 03/2013 Subsequently with a trial of Victoza she did not have much improvement in blood sugars and control of her increased appetite or weight gain Victoza was stopped in 2/15 when she was having excessive weight loss  Recent history:  Insulin regimen: Humalog  breakfast 4-6 units, lunch 4-5; supper 4-6. Lantus insulin 18 units hs        She is here for her periodic follow-up, currently on basal bolus insulin regimen along with Invokana 100 mg and metformin   Current blood sugar patterns and problems identified:  She is checking her blood sugars very sporadically and only on 2 days each month in the last 30 days  She appears to have low normal readings fasting and lunchtime  Evening readings are fairly good, not clear which one is after supper  She does not complain of feeling hypoglycemic even when her blood sugar was 57  However she is saying that sometimes after certain meals she will feel lethargic for some time, does not check her sugar at those times  Still not eating large portions or snacks.  She has variable types of breakfast and today with an Egg McMuffin she took 6 units Humalog and at lunchtime was low without symptoms  She is taking a little more insulin at mealtimes than before and not clear why  No hypoglycemia overnight  Oral hypoglycemic drugs: Invokana 100 mg, metformin 1 g twice a day  Proper timing of medications in relation  to meals: Yes.                  Monitors blood glucose:  less than once a day     Glucometer:  Accu-Chek           Blood Glucose readings from download: Has readings from only 4 days in the last 30 days  Mean values apply above for all meters except median for One Touch  PRE-MEAL Fasting Lunch Dinner Bedtime Overall  Glucose range: 69 67  123   117, 133    Mean/median:      100    Diet: Usually low fat; Breakfast: yogurt usually, sometimes biscuits, rarely oatmeal.  Does eat 3 meals a day, Lunch 1-2 pm dinner 6 Exercise: She thinks she is walking a lot at work, sometimes using her exercise bike at home   Wt Readings from Last 3 Encounters:  08/05/15 150 lb (68.04 kg)  04/01/15 147 lb (66.679 kg)  12/28/14 150 lb (68.04 kg)   Lab Results  Component Value Date   HGBA1C 6.7* 08/02/2015   HGBA1C 6.5 03/29/2015   HGBA1C 6.8* 12/18/2014   Lab Results  Component Value Date   MICROALBUR <0.7 04/01/2015   [place] 31 12/18/2014   CREATININE 0.93 08/02/2015    Lab on 08/02/2015  Component Date Value Ref Range Status  . Hgb A1c MFr Bld 08/02/2015 6.7* 4.6 - 6.5 % Final   Glycemic Control Guidelines for People with Diabetes:Non  Diabetic:  <6%Goal of Therapy: <7%Additional Action Suggested:  >8%   . Sodium 08/02/2015 139  135 - 145 mEq/L Final  . Potassium 08/02/2015 3.7  3.5 - 5.1 mEq/L Final  . Chloride 08/02/2015 101  96 - 112 mEq/L Final  . CO2 08/02/2015 30  19 - 32 mEq/L Final  . Glucose, Bld 08/02/2015 102* 70 - 99 mg/dL Final  . BUN 08/02/2015 10  6 - 23 mg/dL Final  . Creatinine, Ser 08/02/2015 0.93  0.40 - 1.20 mg/dL Final  . Total Bilirubin 08/02/2015 0.5  0.2 - 1.2 mg/dL Final  . Alkaline Phosphatase 08/02/2015 103  39 - 117 U/L Final  . AST 08/02/2015 13  0 - 37 U/L Final  . ALT 08/02/2015 12  0 - 35 U/L Final  . Total Protein 08/02/2015 6.8  6.0 - 8.3 g/dL Final  . Albumin 08/02/2015 3.9  3.5 - 5.2 g/dL Final  . Calcium 08/02/2015 9.1  8.4 - 10.5 mg/dL Final  . GFR  08/02/2015 79.71  >60.00 mL/min Final      Medication List       This list is accurate as of: 08/05/15  4:31 PM.  Always use your most recent med list.               accu-chek soft touch lancets  Use as instructed to check blood sugars daily dx 250.01     amphetamine-dextroamphetamine 30 MG 24 hr capsule  Commonly known as:  ADDERALL XR  Take by mouth every morning.     ARIPiprazole 10 MG tablet  Commonly known as:  ABILIFY     aspirin 81 MG tablet  Take 81 mg by mouth daily.     canagliflozin 100 MG Tabs tablet  Commonly known as:  INVOKANA  Take 1 tablet (100 mg total) by mouth daily.     celecoxib 200 MG capsule  Commonly known as:  CELEBREX  Take 200 mg by mouth daily.     cetirizine 10 MG tablet  Commonly known as:  ZYRTEC  Take 10 mg by mouth daily as needed for allergies.     Clobetasol Propionate 0.05 % lotion     ergocalciferol 50000 UNITS capsule  Commonly known as:  VITAMIN D2  Take 50,000 Units by mouth once a week.     esomeprazole 40 MG capsule  Commonly known as:  NEXIUM  Take 40 mg by mouth daily before breakfast.     ezetimibe-simvastatin 10-80 MG tablet  Commonly known as:  VYTORIN  Take 1 tablet by mouth at bedtime.     FERREX 150 PO  Take 150 mg by mouth daily.     FLUOCINOLONE ACETONIDE SCALP 0.01 % Oil     furosemide 80 MG tablet  Commonly known as:  LASIX  Take 80 mg by mouth daily.     gabapentin 600 MG tablet  Commonly known as:  NEURONTIN  2 tablets twice daily     glucose blood test strip  Commonly known as:  ACCU-CHEK SMARTVIEW  Use as instructed, dx code 250.00     HYDROcodone-acetaminophen 5-325 MG tablet  Commonly known as:  NORCO  Take 1 tablet by mouth every 6 (six) hours as needed for moderate pain.     imipramine 50 MG tablet  Commonly known as:  TOFRANIL  Take 150 mg by mouth at bedtime.     Insulin Glargine 100 UNIT/ML Solostar Pen  Commonly known as:  LANTUS SOLOSTAR  Inject 26 Units into the  skin at  bedtime.     insulin lispro 100 UNIT/ML KiwkPen  Commonly known as:  HUMALOG  Inject 10 Units into the skin 3 (three) times daily.     Insulin Pen Needle 31G X 5 MM Misc  Commonly known as:  B-D UF III MINI PEN NEEDLES  Use 4 per day     KLOR-CON M20 20 MEQ tablet  Generic drug:  potassium chloride SA  Take 40 mEq by mouth daily.     metFORMIN 1000 MG tablet  Commonly known as:  GLUCOPHAGE  Take 1 tablet (1,000 mg total) by mouth 2 (two) times daily with a meal.     mirtazapine 15 MG tablet  Commonly known as:  REMERON  Take 15 mg by mouth at bedtime.     NUQUIN HP 4 % Crea     PRISTIQ 50 MG 24 hr tablet  Generic drug:  desvenlafaxine     telmisartan 80 MG tablet  Commonly known as:  MICARDIS  Take 80 mg by mouth daily.     tiZANidine 2 MG tablet  Commonly known as:  ZANAFLEX  Take 2 mg by mouth at bedtime.     triamcinolone 55 MCG/ACT Aero nasal inhaler  Commonly known as:  NASACORT  Place 2 sprays into the nose daily.     venlafaxine 75 MG tablet  Commonly known as:  EFFEXOR  Take 300 mg by mouth daily.        Allergies: No Known Allergies  Past Medical History  Diagnosis Date  . Depression   . Dyslipidemia   . Bipolar disorder ([place])   . Vitamin D deficiency   . GERD (gastroesophageal reflux disease)   . Anemia   . Diabetic neuropathy (HCC)     feet  . Osteoarthritis     knees  . ADHD (attention deficit hyperactivity disorder)   . Anxiety   . Hypertension     states under control with med., has been on med. x 20 yrs.  Marland Kitchen SLEEP APNEA     uses CPAP nightly  . Insulin dependent diabetes mellitus ([place])   . Carpal tunnel syndrome of right wrist 09/2014  . Cataract, immature     Past Surgical History  Procedure Laterality Date  . Laparoscopic gastric banding  12/05/2005  . Esophagogastroduodenoscopy  02/03/2004  . Colonoscopy with propofol  10/27/2013  . Breast reduction surgery Bilateral   . Carpal tunnel release Right 10/08/2014    Procedure:  RIGHT CARPAL TUNNEL RELEASE;  Surgeon: Daryll Brod, MD;  Location: [place];  Service: Orthopedics;  Laterality: Right;    Family History  Problem Relation Age of Onset  . Heart disease Father   . Breast cancer Sister   . Diabetes Sister   . Colon cancer Neg Hx   . Sarcoidosis Brother     Social History:  reports that she has never smoked. She has never used smokeless tobacco. She reports that she does not drink alcohol or use illicit drugs.  Review of Systems:  She has a history of obesity, previously weight was 250, treated with a LAP-BAND.  Now her weight is steady at about 150 pounds  HYPERTENSION:   This in mild and treated with Micardis by PCP, and is on 40 mg    She has however reduced her Lasix to 40 mg as directed on her last visit because of relatively low blood pressure   HYPERLIPIDEMIA: The lipid abnormality consists of elevated LDL. Has been given Vytorin  by PCP and LDL has been rather low, she has no history of CAD  Lab Results  Component Value Date   CHOL 83 12/18/2014   HDL 44.30 12/18/2014   LDLCALC 31 12/18/2014   TRIG 38.0 12/18/2014   CHOLHDL 2 12/18/2014    No history of recent leg edema; she is on 40 mg Lasix now  Has some pain in legs and feet from neuropathy and is taking large doses of gabapentin partly for depression; does have mild sensory loss on exam although her exam appears to be somewhat unreliable  She is again asking about the pain in her right midfoot and some in her left big toe, mostly on walking  Diabetic foot exam done in 2/16 shows Callus on the right heel , reduced monofilament sensation in feet distally but appears to have somewhat unreliable responses in the toes and plantar surfaces, no skin lesions or ulcers on the feet and normal pedal pulses     Examination:   BP 108/68 mmHg  Pulse 85  Temp(Src) 98.1 F (36.7 C)  Resp 14  Ht 5\' 5"  (1.651 m)  Wt 150 lb (68.04 kg)  BMI 24.96 kg/m2  SpO2 99%  Body mass  index is 24.96 kg/(m^2).   No pedal edema present   ASSESSMENT/ PLAN:   Diabetes type 2  See history of present illness for detailed discussion of current management, blood sugar patterns and problems identified  The patient's diabetes control is excellent with blood sugars at home looking fairly close to normal A1c is relatively higher than expected at 6.7 She is probably getting excessive amount of insulin with low normal readings at times and possible hypoglycemia postprandially when she feels lethargic but does not monitor her sugar Not adjusting mealtime insulin based on what she is eating especially when she is eating a more balanced meal in the morning, lowest glucose 57 at lunch  She is overall active but not doing a lot of formal exercise  Discussed adjustment of mealtime doses based on amount of carbohydrate and balanced meals with protein She will also need to check more readings after meals to help adjust her mealtime doses with certain meals She does need to cut back on her mealtime coverage at breakfast when eating breakfast like eggs Also discussed reducing Lantus since fasting readings are probably relatively low She will check more readings after meals especially if she feels lethargic Encouraged her to use exercise bike regularly Call if getting more low sugars She has managed to maintain her weight down especially with the help of Invokana which she is tolerating  Right foot pain: She will discuss with her podiatrist  HYPERTENSION: The blood pressure is relatively low today without any symptoms and she will continue follow-up with PCP    Counseling time on subjects discussed above is over 50% of today's 25 minute visit  Shantee Hayne 08/05/2015, 4:31 PM

## 2015-08-09 ENCOUNTER — Other Ambulatory Visit: Payer: Self-pay | Admitting: Endocrinology

## 2015-08-16 ENCOUNTER — Telehealth: Payer: Self-pay | Admitting: *Deleted

## 2015-08-16 NOTE — Telephone Encounter (Signed)
Noted, message left for patient

## 2015-08-16 NOTE — Telephone Encounter (Signed)
A1c 6.7, chemistry panel normal

## 2015-08-16 NOTE — Telephone Encounter (Signed)
Patient called, she said at her visit on Friday you did not discuss her lab results.  Please advise.

## 2015-11-01 ENCOUNTER — Other Ambulatory Visit (INDEPENDENT_AMBULATORY_CARE_PROVIDER_SITE_OTHER): Payer: Federal, State, Local not specified - PPO

## 2015-11-01 DIAGNOSIS — E1165 Type 2 diabetes mellitus with hyperglycemia: Secondary | ICD-10-CM | POA: Diagnosis not present

## 2015-11-01 DIAGNOSIS — Z794 Long term (current) use of insulin: Secondary | ICD-10-CM | POA: Diagnosis not present

## 2015-11-01 LAB — COMPREHENSIVE METABOLIC PANEL
ALBUMIN: 3.9 g/dL (ref 3.5–5.2)
ALK PHOS: 114 U/L (ref 39–117)
ALT: 36 U/L — ABNORMAL HIGH (ref 0–35)
AST: 28 U/L (ref 0–37)
BUN: 14 mg/dL (ref 6–23)
CO2: 29 mEq/L (ref 19–32)
Calcium: 8.9 mg/dL (ref 8.4–10.5)
Chloride: 103 mEq/L (ref 96–112)
Creatinine, Ser: 0.77 mg/dL (ref 0.40–1.20)
GFR: 99.03 mL/min (ref >60.00)
Glucose, Bld: 91 mg/dL (ref 70–99)
Potassium: 3.8 mEq/L (ref 3.5–5.1)
Sodium: 139 mEq/L (ref 135–145)
TOTAL PROTEIN: 6.5 g/dL (ref 6.0–8.3)
Total Bilirubin: 0.4 mg/dL (ref 0.2–1.2)

## 2015-11-01 LAB — HEMOGLOBIN A1C: Hgb A1c MFr Bld: 6.6 % — ABNORMAL HIGH (ref 4.6–6.5)

## 2015-11-04 ENCOUNTER — Encounter: Payer: Self-pay | Admitting: Endocrinology

## 2015-11-04 ENCOUNTER — Ambulatory Visit (INDEPENDENT_AMBULATORY_CARE_PROVIDER_SITE_OTHER): Payer: Federal, State, Local not specified - PPO | Admitting: Endocrinology

## 2015-11-04 ENCOUNTER — Ambulatory Visit: Payer: Federal, State, Local not specified - PPO | Admitting: Endocrinology

## 2015-11-04 VITALS — BP 112/66 | HR 87 | Temp 99.0°F | Resp 14 | Ht 65.0 in | Wt 151.0 lb

## 2015-11-04 DIAGNOSIS — E1165 Type 2 diabetes mellitus with hyperglycemia: Secondary | ICD-10-CM

## 2015-11-04 DIAGNOSIS — IMO0002 Reserved for concepts with insufficient information to code with codable children: Secondary | ICD-10-CM

## 2015-11-04 DIAGNOSIS — E1149 Type 2 diabetes mellitus with other diabetic neurological complication: Secondary | ICD-10-CM | POA: Diagnosis not present

## 2015-11-04 NOTE — Progress Notes (Signed)
Patient ID: Amy [name], female   DOB: 08/20/1956, 60 y.o.   MRN: [id number]   Reason for Appointment:  Followup of diabetes  History of Present Illness   Diagnosis: Type 2 DIABETES MELITUS, date of diagnosis 1993   Prior history: Her diabetes has been treated for several years with basal bolus insulin and also metformin She usually has had A1c results near normal and is fairly compliant with her basal bolus regimen She continues to be on various atypical antipsychotic drugs also However her blood sugars were poorly controlled in 03/2013 Subsequently with a trial of Victoza she did not have much improvement in blood sugars and control of her increased appetite or weight gain Victoza was stopped in 2/15 when she was having excessive weight loss  Recent history:  Insulin regimen: Humalog  breakfast 2 units, lunch 4; supper 4 . Lantus insulin 16 units hs        She is here for her periodic follow-up,  on low-dose basal bolus insulin regimen along with Invokana 100 mg and metformin   Current management, blood sugar patterns and problems identified:  She is checking her blood sugars infrequently again despite instructions  Has no readings in the mornings although fasting glucose was about 90 the lab  She says that she was starting to get some low sugars at lunchtime as she had change her breakfast 2 more protein.  She is only taking 2 units of Humalog at breakfast now  She is still occasionally has a low-normal reading in the afternoon when she is checking her blood sugar most of the time  No postprandial monitoring  A1c is stable at 6.6  No hypoglycemia overnight  Oral hypoglycemic drugs: Invokana 100 mg, metformin 1 g twice a day  Proper timing of medications in relation to meals: Yes.                  Monitors blood glucose:  less than once a day     Glucometer:  Accu-Chek           Blood Glucose readings from download: Has readings from only 4 days in the last  30 days  Mean values apply above for all meters except median for One Touch  PRE-MEAL Fasting Lunch  3-4 PM  Bedtime Overall  Glucose range:    66-145   110    Mean/median:        Diet: Usually low fat; Breakfast: cheese crackers, yogurt at various times.  Does eat 3 meals a day, Lunch 1-2 pm dinner 6 Exercise: She thinks she is walking a lot at work, sometimes using her exercise bike at home   Wt Readings from Last 3 Encounters:  11/04/15 151 lb (68.493 kg)  08/05/15 150 lb (68.04 kg)  04/01/15 147 lb (66.679 kg)   Lab Results  Component Value Date   HGBA1C 6.6* 11/01/2015   HGBA1C 6.7* 08/02/2015   HGBA1C 6.5 03/29/2015   Lab Results  Component Value Date   MICROALBUR <0.7 04/01/2015   [place] 31 12/18/2014   CREATININE 0.77 11/01/2015    Lab on 11/01/2015  Component Date Value Ref Range Status  . Hgb A1c MFr Bld 11/01/2015 6.6* 4.6 - 6.5 % Final   Glycemic Control Guidelines for People with Diabetes:Non Diabetic:  <6%Goal of Therapy: <7%Additional Action Suggested:  >8%   . Sodium 11/01/2015 139  135 - 145 mEq/L Final  . Potassium 11/01/2015 3.8  3.5 - 5.1 mEq/L Final  .  Chloride 11/01/2015 103  96 - 112 mEq/L Final  . CO2 11/01/2015 29  19 - 32 mEq/L Final  . Glucose, Bld 11/01/2015 91  70 - 99 mg/dL Final  . BUN 11/01/2015 14  6 - 23 mg/dL Final  . Creatinine, Ser 11/01/2015 0.77  0.40 - 1.20 mg/dL Final  . Total Bilirubin 11/01/2015 0.4  0.2 - 1.2 mg/dL Final  . Alkaline Phosphatase 11/01/2015 114  39 - 117 U/L Final  . AST 11/01/2015 28  0 - 37 U/L Final  . ALT 11/01/2015 36* 0 - 35 U/L Final  . Total Protein 11/01/2015 6.5  6.0 - 8.3 g/dL Final  . Albumin 11/01/2015 3.9  3.5 - 5.2 g/dL Final  . Calcium 11/01/2015 8.9  8.4 - 10.5 mg/dL Final  . GFR 11/01/2015 99.03  >60.00 mL/min Final      Medication List       This list is accurate as of: 11/04/15 11:59 PM.  Always use your most recent med list.               accu-chek soft touch lancets  Use as  instructed to check blood sugars daily dx 250.01     amphetamine-dextroamphetamine 30 MG 24 hr capsule  Commonly known as:  ADDERALL XR  Take by mouth every morning.     ARIPiprazole 10 MG tablet  Commonly known as:  ABILIFY     aspirin 81 MG tablet  Take 81 mg by mouth daily.     celecoxib 200 MG capsule  Commonly known as:  CELEBREX  Take 200 mg by mouth daily.     cetirizine 10 MG tablet  Commonly known as:  ZYRTEC  Take 10 mg by mouth daily as needed for allergies.     Clobetasol Propionate 0.05 % lotion     ergocalciferol 50000 units capsule  Commonly known as:  VITAMIN D2  Take 50,000 Units by mouth once a week.     esomeprazole 40 MG capsule  Commonly known as:  NEXIUM  Take 40 mg by mouth daily before breakfast.     ezetimibe-simvastatin 10-80 MG tablet  Commonly known as:  VYTORIN  Take 1 tablet by mouth at bedtime.     FERREX 150 PO  Take 150 mg by mouth daily.     FLUOCINOLONE ACETONIDE SCALP 0.01 % Oil     furosemide 80 MG tablet  Commonly known as:  LASIX  Take 80 mg by mouth daily.     gabapentin 600 MG tablet  Commonly known as:  NEURONTIN  2 tablets twice daily     glucose blood test strip  Commonly known as:  ACCU-CHEK SMARTVIEW  Use as instructed, dx code 250.00     imipramine 50 MG tablet  Commonly known as:  TOFRANIL  Take 150 mg by mouth at bedtime.     Insulin Glargine 100 UNIT/ML Solostar Pen  Commonly known as:  LANTUS SOLOSTAR  Inject 26 Units into the skin at bedtime.     insulin lispro 100 UNIT/ML KiwkPen  Commonly known as:  HUMALOG  Inject 3 Units into the skin 3 (three) times daily.     Insulin Pen Needle 31G X 5 MM Misc  Commonly known as:  B-D UF III MINI PEN NEEDLES  Use 4 per day     INVOKANA 100 MG Tabs tablet  Generic drug:  canagliflozin  TAKE 1 TABLET DAILY     KLOR-CON M20 20 MEQ tablet  Generic drug:  potassium  chloride SA  Take 40 mEq by mouth daily.     metFORMIN 1000 MG tablet  Commonly known as:   GLUCOPHAGE  Take 1 tablet (1,000 mg total) by mouth 2 (two) times daily with a meal.     NUQUIN HP 4 % Crea     PRISTIQ 50 MG 24 hr tablet  Generic drug:  desvenlafaxine     telmisartan 80 MG tablet  Commonly known as:  MICARDIS  Take 80 mg by mouth daily.     tiZANidine 2 MG tablet  Commonly known as:  ZANAFLEX  Take 2 mg by mouth at bedtime.     triamcinolone 55 MCG/ACT Aero nasal inhaler  Commonly known as:  NASACORT  Place 2 sprays into the nose daily.        Allergies: No Known Allergies  Past Medical History  Diagnosis Date  . Depression   . Dyslipidemia   . Bipolar disorder ([place])   . Vitamin D deficiency   . GERD (gastroesophageal reflux disease)   . Anemia   . Diabetic neuropathy (HCC)     feet  . Osteoarthritis     knees  . ADHD (attention deficit hyperactivity disorder)   . Anxiety   . Hypertension     states under control with med., has been on med. x 20 yrs.  Marland Kitchen SLEEP APNEA     uses CPAP nightly  . Insulin dependent diabetes mellitus ([place])   . Carpal tunnel syndrome of right wrist 09/2014  . Cataract, immature     Past Surgical History  Procedure Laterality Date  . Laparoscopic gastric banding  12/05/2005  . Esophagogastroduodenoscopy  02/03/2004  . Colonoscopy with propofol  10/27/2013  . Breast reduction surgery Bilateral   . Carpal tunnel release Right 10/08/2014    Procedure: RIGHT CARPAL TUNNEL RELEASE;  Surgeon: Daryll Brod, MD;  Location: [place];  Service: Orthopedics;  Laterality: Right;    Family History  Problem Relation Age of Onset  . Heart disease Father   . Breast cancer Sister   . Diabetes Sister   . Colon cancer Neg Hx   . Sarcoidosis Brother     Social History:  reports that she has never smoked. She has never used smokeless tobacco. She reports that she does not drink alcohol or use illicit drugs.  Review of Systems:  She has a history of obesity, previously weight was 250, treated with a LAP-BAND.    HYPERTENSION:   This in mild and treated with Micardis by PCP, and is on 40 mg     HYPERLIPIDEMIA: The lipid abnormality consists of elevated LDL.  Has been given Vytorin by PCP and LDL has been rather low, she has no history of CAD No recent labs available  Lab Results  Component Value Date   CHOL 83 12/18/2014   HDL 44.30 12/18/2014   LDLCALC 31 12/18/2014   TRIG 38.0 12/18/2014   CHOLHDL 2 12/18/2014    No history of recent leg edema; she is on 40 mg Lasix now  Has some pain in legs and feet from neuropathy and is taking large doses of gabapentin partly for depression; does have mild sensory loss on exam although her exam appears to be somewhat unreliable  Celebrex given by PCP helps leg pain  Diabetic foot exam done in 2/16 shows Callus on the right heel , reduced monofilament sensation in feet distally but appears to have somewhat unreliable responses in the toes and plantar surfaces, no skin  lesions or ulcers on the feet and normal pedal pulses     Examination:   BP 112/66 mmHg  Pulse 87  Temp(Src) 99 F (37.2 C)  Resp 14  Ht 5\' 5"  (1.651 m)  Wt 151 lb (68.493 kg)  BMI 25.13 kg/m2  SpO2 99%  Body mass index is 25.13 kg/(m^2).     ASSESSMENT/ PLAN:   Diabetes type 2  See history of present illness for detailed discussion of current management, blood sugar patterns and problems identified  The patient's diabetes control is excellent with A1c 6.6 Most of her blood sugars are fairly good although checking infrequently  Her portions are fairly small and needing only small amounts of insulin overall including basal  Since fasting reading is 91 will continue the same dose of Lantus She can skip her breakfast Humalog unless eating significant amount of carbohydrate, currently taking only 2 units She has managed to maintain her weight down especially with the help of Invokana which she is tolerating  HYPERTENSION: The blood pressure is controlled     Amy [name] 11/05/2015, 11:29 AM

## 2015-11-04 NOTE — Patient Instructions (Addendum)
Check blood sugars on waking up 2-3  times a week Also check blood sugars about 2 hours after a meal and do this after different meals by rotation  Recommended blood sugar levels on waking up is 90-130 and about 2 hours after meal is 130-160  Please bring your blood sugar monitor to each visit, thank you  Skip Humalog in am  Try Lotrimin on toe surfaces  Gabapentin as needed

## 2015-12-24 ENCOUNTER — Other Ambulatory Visit: Payer: Self-pay | Admitting: *Deleted

## 2015-12-24 ENCOUNTER — Telehealth: Payer: Self-pay | Admitting: Endocrinology

## 2015-12-24 MED ORDER — GLUCOSE BLOOD VI STRP: ORAL_STRIP | Status: DC

## 2015-12-24 NOTE — Telephone Encounter (Signed)
rx sent

## 2015-12-24 NOTE — Telephone Encounter (Signed)
PT called and needs One Touch Verio Test Strips called into [facility] for a 90 days supply

## 2015-12-30 ENCOUNTER — Telehealth: Payer: Self-pay | Admitting: Endocrinology

## 2015-12-30 NOTE — Telephone Encounter (Signed)
PT needs the strips for One Touch Verio sent to [facility]

## 2015-12-30 NOTE — Telephone Encounter (Signed)
PT needs strips for One Touch Verio sent to [facility]

## 2015-12-31 MED ORDER — GLUCOSE BLOOD VI STRP: ORAL_STRIP | Status: DC

## 2015-12-31 NOTE — Telephone Encounter (Signed)
Rx submitted per pt's request.  

## 2016-01-13 ENCOUNTER — Ambulatory Visit: Payer: 59 | Admitting: Podiatry

## 2016-01-25 ENCOUNTER — Ambulatory Visit: Payer: Federal, State, Local not specified - PPO | Admitting: Diagnostic Neuroimaging

## 2016-01-28 ENCOUNTER — Ambulatory Visit (INDEPENDENT_AMBULATORY_CARE_PROVIDER_SITE_OTHER): Payer: Federal, State, Local not specified - PPO | Admitting: Neurology

## 2016-01-28 ENCOUNTER — Encounter: Payer: Self-pay | Admitting: Diagnostic Neuroimaging

## 2016-01-28 VITALS — BP 107/65 | HR 79 | Ht 65.0 in | Wt 158.0 lb

## 2016-01-28 DIAGNOSIS — R413 Other amnesia: Secondary | ICD-10-CM | POA: Diagnosis not present

## 2016-01-28 DIAGNOSIS — R404 Transient alteration of awareness: Secondary | ICD-10-CM | POA: Diagnosis not present

## 2016-01-28 DIAGNOSIS — R410 Disorientation, unspecified: Secondary | ICD-10-CM

## 2016-01-28 DIAGNOSIS — R5383 Other fatigue: Secondary | ICD-10-CM | POA: Diagnosis not present

## 2016-01-28 DIAGNOSIS — F05 Delirium due to known physiological condition: Secondary | ICD-10-CM | POA: Diagnosis not present

## 2016-01-28 NOTE — Progress Notes (Signed)
[name]    Provider:  Dr Jaynee Eagles Referring Provider: Harlan Stains, MD, Dr. Chucky [name] Primary Care Physician:  Amy Schwalbe, MD  CC:  Memory problems  HPI:  Amy [name] is a 60 y.o. female here as a referral from Dr. Dema [name] for memory problems. PMHx resistant depression, HLD, bipolar, diabetic neuropathy, anxiety, htn, sleep apnea, diabetes. Started a year or two ago. At that time she was in her husband's garage and she "fell out" and hit her head on the concrete. No bleeding, bruising, afterwards she denies any post-concussive symptoms. She didn't pay attention after that but she feels like maybe this is when she has memory problems, unsure. She can put things down and doesn't remember where she places it. Husband pays the bills. Other are already on automatic pay, she forgets if she took her medicine. She goes into the kitchen and forgets what she was there for. She leaves home in the morning and can't remember how she got to work. Sometimes she feels like she slurs her speech and can't talk since she had the work on her teeth. Lots of stress and depression [name] be contributing, there is stress in her life, she panics a lot. Mother without dementia, she is 66.No dementia in the family. She endorses compliance with CPAP.  Reviewed outside 26 documents from [facility]: 60 year old with many years of resistant depression and forgetfulness out of proportion. Mom passed away in 10-Dec-2015, could not focus even before mom passed away, forgetful, unable to remember work life and really struggles to focus, hard time organizing. Appears her brother is also in hospice after motorcycle accident, her nephew was murdered a year ago. Notes also show that she was given Adderall.  Notes from [facility] show that she follows with endocrinology for her type 2 diabetes date of diagnosis 1989-12-10 treated with insulin and metformin fairly compliant with her regimen with  near-normal A1c results.   She had carpal tunnel surgery in February 2016 the right hand. Appears she also had a lap band in December 09, 2005 and fluid removal due to obstruction in 12/09/12. She was admitted to [facility] in August 2014 for TIA versus adverse effects of Wellbutrin after slurred speech and left arm weakness, lip smacking, in the setting of stress at home. Neurology was consulted, MRI of the brain was negative for acute stroke, patient admitted to be an under significant stress and was placed on Wellbutrin a week ago.  MRI of the brain August 2014, personally reviewed images and agree with the following: Comparison: Head CT same day  Findings: Diffusion imaging does not show any acute or subacute infarction. The the brainstem and cerebellum are normal. The cerebral hemispheres show scattered foci of T2 and FLAIR signal within the deep and subcortical white matter consistent with mild chronic small vessel disease. No cortical or large vessel territory infarction. No mass lesion, hemorrhage, hydrocephalus or extra-axial collection. The sinuses are clear. No pituitary mass. No skull or skull base lesion.  IMPRESSION: No acute abnormality. Mild chronic small vessel disease of the cerebral hemispheric white matter.  Review of Systems: Patient complains of symptoms per HPI as well as the following symptoms: Fatigue, blurred vision, easy bruising, chest pain, swelling in the legs, short of breath, snoring, increased thirst, joint pain, joint swelling, constipation, aching muscles, rash, itching, allergies, runny nose, skin sensitivity, frequent infections, memory loss, confusion, headache, numbness, weakness, slurred speech, difficulty swallowing, dizziness, sleepiness, snoring, restless legs, depression, anxiety, decreased energy, change  in appetite, disinterest in activities, racing thoughts . Pertinent negatives per HPI. All others negative.   Social History   Social History  .  Marital Status: Married    Spouse Name: Amy [name]  . Number of Children: 2  . Years of Education: 12   Occupational History  . maintenance [employer]   Social History Main Topics  . Smoking status: Never Smoker   . Smokeless tobacco: Never Used  . Alcohol Use: No  . Drug Use: No  . Sexual Activity: Not on file   Other Topics Concern  . Not on file   Social History Narrative   Married with one son and one daughter   Daily Caffeine use: coffee 1 per day    Family History  Problem Relation Age of Onset  . Heart disease Father   . Breast cancer Sister   . Diabetes Sister   . Colon cancer Neg Hx   . Dementia Neg Hx   . Sarcoidosis Brother   . Cancer Brother     prostate  . Heart failure Mother     Past Medical History  Diagnosis Date  . Depression   . Dyslipidemia   . Bipolar disorder ([place])   . Vitamin D deficiency   . GERD (gastroesophageal reflux disease)   . Anemia   . Diabetic neuropathy ([facility])     feet  . Osteoarthritis     knees  . ADHD (attention deficit hyperactivity disorder)   . Anxiety   . Hypertension     states under control with med., has been on med. x 20 yrs.  Marland Kitchen SLEEP APNEA     uses CPAP nightly  . Insulin dependent diabetes mellitus ([place])   . Carpal tunnel syndrome of right wrist 09/2014  . Cataract, immature     Past Surgical History  Procedure Laterality Date  . Laparoscopic gastric banding  12/05/2005  . Esophagogastroduodenoscopy  02/03/2004  . Colonoscopy with propofol  10/27/2013  . Breast reduction surgery Bilateral   . Carpal tunnel release Right 10/08/2014    Procedure: RIGHT CARPAL TUNNEL RELEASE;  Surgeon: Daryll Brod, MD;  Location: [facility];  Service: Orthopedics;  Laterality: Right;    Current Outpatient Prescriptions  Medication Sig Dispense Refill  . amphetamine-dextroamphetamine (ADDERALL XR) 30 MG 24 hr capsule Take by mouth every morning.  0  . ARIPiprazole (ABILIFY) 10 MG tablet     . aspirin 81 MG  tablet Take 81 mg by mouth daily.     . celecoxib (CELEBREX) 200 MG capsule Take 200 mg by mouth daily.    . cetirizine (ZYRTEC) 10 MG tablet Take 10 mg by mouth daily as needed for allergies.    . Clobetasol Propionate 0.05 % lotion     . ergocalciferol (VITAMIN D2) 50000 UNITS capsule Take 50,000 Units by mouth once a week.      . esomeprazole (NEXIUM) 40 MG capsule Take 40 mg by mouth daily before breakfast.    . ezetimibe-simvastatin (VYTORIN) 10-80 MG per tablet Take 1 tablet by mouth at bedtime.     Marland Kitchen FLUOCINOLONE ACETONIDE SCALP 0.01 % OIL     . furosemide (LASIX) 80 MG tablet Take 80 mg by mouth daily.     Marland Kitchen gabapentin (NEURONTIN) 600 MG tablet 2 tablets twice daily 360 tablet 0  . glucose blood (ONETOUCH VERIO) test strip Use as instructed to check blood sugar once a day dx code E11.49 100 each 3  . Hydroquinone-Sunscreens (NUQUIN HP)  4 % CREA     . imipramine (TOFRANIL) 50 MG tablet Take 150 mg by mouth at bedtime.     . Insulin Glargine (LANTUS SOLOSTAR) 100 UNIT/ML Solostar Pen Inject 26 Units into the skin at bedtime. (Patient taking differently: Inject 16 Units into the skin at bedtime. ) 15 pen 1  . insulin lispro (HUMALOG) 100 UNIT/ML KiwkPen Inject 3 Units into the skin 3 (three) times daily.     . Insulin Pen Needle (B-D UF III MINI PEN NEEDLES) 31G X 5 MM MISC Use 4 per day 400 each 1  . INVOKANA 100 MG TABS tablet TAKE 1 TABLET DAILY 90 tablet 1  . KLOR-CON M20 20 MEQ tablet Take 40 mEq by mouth daily.     . Lancets (ACCU-CHEK SOFT TOUCH) lancets Use as instructed to check blood sugars daily dx 250.01 100 each 1  . metFORMIN (GLUCOPHAGE) 1000 MG tablet Take 1 tablet (1,000 mg total) by mouth 2 (two) times daily with a meal. 180 tablet 1  . Polysaccharide Iron Complex (FERREX 150 PO) Take 150 mg by mouth daily.    Marland Kitchen PRISTIQ 50 MG 24 hr tablet     . telmisartan (MICARDIS) 80 MG tablet Take 80 mg by mouth daily.     Marland Kitchen tiZANidine (ZANAFLEX) 2 MG tablet Take 2 mg by mouth at  bedtime.    . triamcinolone (NASACORT) 55 MCG/ACT AERO nasal inhaler Place 2 sprays into the nose daily.    . [DISCONTINUED] imipramine (TOFRANIL-PM) 150 MG capsule Take 4 by mouth at bedtime      No current facility-administered medications for this visit.    Allergies as of 01/28/2016  . (No Known Allergies)    Vitals: BP 107/65 mmHg  Pulse 79  Ht 5\' 5"  (1.651 m)  Wt 158 lb (71.668 kg)  BMI 26.29 kg/m2 Last Weight:  Wt Readings from Last 1 Encounters:  01/28/16 158 lb (71.668 kg)   Last Height:   Ht Readings from Last 1 Encounters:  01/28/16 5\' 5"  (1.651 m)   Physical exam: Exam: Gen: NAD, conversant, well nourised, obese, well groomed                     CV: RRR, no MRG. No Carotid Bruits. No peripheral edema, warm, nontender Eyes: Conjunctivae clear without exudates or hemorrhage  Neuro: Detailed Neurologic Exam  Speech:    Speech is normal; fluent and spontaneous with normal comprehension.  Cognition:  MMSE - Mini Mental State Exam 01/28/2016  Orientation to time 5  Orientation to Place 5  Registration 3  Attention/ Calculation 3  Recall 1  Language- name 2 objects 2  Language- repeat 0  Language- follow 3 step command 3  Language- read & follow direction 1  Write a sentence 1  Copy design 0  Total score 24      The patient is oriented to person, place, and time;     recent and remote memory intact;     language fluent;     normal attention, concentration,     fund of knowledge Cranial Nerves:    The pupils are equal, round, and reactive to light. The fundi are normal and spontaneous venous pulsations are present. Visual fields are full to finger confrontation. Extraocular movements are intact. Trigeminal sensation is intact and the muscles of mastication are normal. The face is symmetric. The palate elevates in the midline. Hearing intact. Voice is normal. Shoulder shrug is normal. The tongue has normal  motion without fasciculations.   Coordination:     Normal finger to nose and heel to shin. Normal rapid alternating movements.   Gait:    Heel-toe and tandem gait are normal.   Motor Observation:    No asymmetry, no atrophy, and no involuntary movements noted. Tone:    Normal muscle tone.    Posture:    Posture is normal. normal erect    Strength:    Strength is V/V in the upper and lower limbs.      Sensation: intact to LT     Reflex Exam:  DTR's:    Deep tendon reflexes in the upper and lower extremities are normal bilaterally.   Toes:    The toes are downgoing bilaterally.   Clonus:    Clonus is absent.       Assessment/Plan:   60 y.o. female here as a referral from Dr. Dema [name] and Dr. Toy Care for memory problems. PMHx resistant depression, HLD, bipolar, diabetic neuropathy, anxiety, htn, sleep apnea, diabetes here for memory complaints. Notes from psychiatry state that she has resistant depression, difficulty with concentration, forgetfulness.  MRI of the brain EEG to evaluate for epileptiform activity Labs today including thyroid and B12, HIV and RPR Pending results can consider formal neurocognitive testing  CC: Dr. Dema [name] and Dr. Ardelle Balls, [facility] [address] [place], [place] [address]  Phone [phone] Fax [phone]

## 2016-01-28 NOTE — Patient Instructions (Addendum)
Remember to drink plenty of fluid, eat healthy meals and do not skip any meals. Try to eat protein with a every meal and eat a healthy snack such as fruit or nuts in between meals. Try to keep a regular sleep-wake schedule and try to exercise daily, particularly in the form of walking, 20-30 minutes a day, if you can.   As far as diagnostic testing: MRI brain, labs, eeg   Our phone number is [phone]. We also have an after hours call service for urgent matters and there is a physician on-call for urgent questions. For any emergencies you know to call 911 or go to the nearest emergency room

## 2016-01-29 LAB — B12 AND FOLATE PANEL
Folate: 14.8 ng/mL (ref >3.0)
Vitamin B-12: 434 pg/mL (ref 211–946)

## 2016-01-29 LAB — THYROID PANEL WITH TSH
Free Thyroxine Index: 1.3 (ref 1.2–4.9)
T3 UPTAKE RATIO: 28 % (ref 24–39)
T4 TOTAL: 4.6 ug/dL (ref 4.5–12.0)
TSH: 0.933 u[IU]/mL (ref 0.450–4.500)

## 2016-01-29 LAB — RPR: RPR: NONREACTIVE

## 2016-01-29 LAB — HIV ANTIBODY (ROUTINE TESTING W REFLEX): HIV SCREEN 4TH GENERATION: NONREACTIVE

## 2016-01-30 ENCOUNTER — Encounter: Payer: Self-pay | Admitting: Neurology

## 2016-01-31 ENCOUNTER — Telehealth: Payer: Self-pay | Admitting: *Deleted

## 2016-01-31 ENCOUNTER — Other Ambulatory Visit (INDEPENDENT_AMBULATORY_CARE_PROVIDER_SITE_OTHER): Payer: Federal, State, Local not specified - PPO

## 2016-01-31 DIAGNOSIS — E1165 Type 2 diabetes mellitus with hyperglycemia: Secondary | ICD-10-CM | POA: Diagnosis not present

## 2016-01-31 DIAGNOSIS — IMO0002 Reserved for concepts with insufficient information to code with codable children: Secondary | ICD-10-CM

## 2016-01-31 DIAGNOSIS — E1149 Type 2 diabetes mellitus with other diabetic neurological complication: Secondary | ICD-10-CM

## 2016-01-31 LAB — COMPREHENSIVE METABOLIC PANEL
ALT: 19 U/L (ref 0–35)
AST: 18 U/L (ref 0–37)
Albumin: 3.9 g/dL (ref 3.5–5.2)
Alkaline Phosphatase: 111 U/L (ref 39–117)
BUN: 13 mg/dL (ref 6–23)
CALCIUM: 9.3 mg/dL (ref 8.4–10.5)
CHLORIDE: 102 meq/L (ref 96–112)
CO2: 32 meq/L (ref 19–32)
Creatinine, Ser: 0.85 mg/dL (ref 0.40–1.20)
GFR: 88.27 mL/min (ref >60.00)
GLUCOSE: 107 mg/dL — AB (ref 70–99)
Potassium: 4.6 mEq/L (ref 3.5–5.1)
Sodium: 139 mEq/L (ref 135–145)
Total Bilirubin: 0.3 mg/dL (ref 0.2–1.2)
Total Protein: 6.7 g/dL (ref 6.0–8.3)

## 2016-01-31 LAB — LIPID PANEL
CHOL/HDL RATIO: 3
Cholesterol: 197 mg/dL (ref 0–200)
HDL: 66.9 mg/dL (ref >39.00)
LDL CALC: 122 mg/dL — AB (ref 0–99)
NONHDL: 130.38
TRIGLYCERIDES: 41 mg/dL (ref 0.0–149.0)
VLDL: 8.2 mg/dL (ref 0.0–40.0)

## 2016-01-31 LAB — HEMOGLOBIN A1C: HEMOGLOBIN A1C: 7 % — AB (ref 4.6–6.5)

## 2016-01-31 LAB — MICROALBUMIN / CREATININE URINE RATIO
Creatinine,U: 141.2 mg/dL
Microalb Creat Ratio: 0.5 mg/g (ref 0.0–30.0)
Microalb, Ur: <0.7 mg/dL (ref 0.0–1.9)

## 2016-01-31 NOTE — Telephone Encounter (Signed)
Called and spoke to pt about normal labs per Dr Jaynee Eagles note. Reminded pt of upcoming EEG on 6/28 at 4pm. Pt verbalized understanding.

## 2016-01-31 NOTE — Telephone Encounter (Signed)
-----   Message from Melvenia Beam, MD sent at 01/29/2016 12:16 PM EDT ----- Labs are normal thanks

## 2016-02-03 ENCOUNTER — Encounter: Payer: Self-pay | Admitting: Endocrinology

## 2016-02-03 ENCOUNTER — Ambulatory Visit (INDEPENDENT_AMBULATORY_CARE_PROVIDER_SITE_OTHER): Payer: Federal, State, Local not specified - PPO | Admitting: Endocrinology

## 2016-02-03 VITALS — BP 109/63 | HR 97 | Temp 98.9°F | Resp 16 | Ht 64.0 in | Wt 157.5 lb

## 2016-02-03 DIAGNOSIS — E785 Hyperlipidemia, unspecified: Secondary | ICD-10-CM | POA: Diagnosis not present

## 2016-02-03 DIAGNOSIS — E1140 Type 2 diabetes mellitus with diabetic neuropathy, unspecified: Secondary | ICD-10-CM | POA: Diagnosis not present

## 2016-02-03 DIAGNOSIS — Z794 Long term (current) use of insulin: Secondary | ICD-10-CM | POA: Diagnosis not present

## 2016-02-03 DIAGNOSIS — E1165 Type 2 diabetes mellitus with hyperglycemia: Secondary | ICD-10-CM | POA: Diagnosis not present

## 2016-02-03 NOTE — Progress Notes (Signed)
Patient ID: Amy [name], female   DOB: Nov 11, 1955, 60 y.o.   MRN: [id number]   Reason for Appointment:  Followup of diabetes  History of Present Illness   Diagnosis: Type 2 DIABETES MELITUS, date of diagnosis 1993   Prior history: Her diabetes has been treated for several years with basal bolus insulin and also metformin She usually has had A1c results near normal and is fairly compliant with her basal bolus regimen She continues to be on various atypical antipsychotic drugs also However her blood sugars were poorly controlled in 03/2013 Subsequently with a trial of Victoza she did not have much improvement in blood sugars and control of her increased appetite or weight gain Victoza was stopped in 2/15 when she was having excessive weight loss  Recent history:  Insulin regimen: Humalog breakfast 2 units, lunch 4; supper 4 . Lantus insulin 16 units hs        She is here for her periodic follow-up,  on low-dose basal bolus insulin regimen along with Invokana 100 mg and metformin  A1c is slightly higher at 7.0, previously at 6.6  Current management, blood sugar patterns and problems identified:  She is checking her blood sugars about once a day, more frequently than before but mostly in the mornings  Fasting readings are excellent without much fluctuation and overall averaging below 100  No overnight hypoglycemia  She has a couple of readings before and after lunch which are not usually high  She has checked her sugar only 3 times after evening meal and do that are all over 190, occasionally related to not watching her diet  She is fairly compliant with taking her mealtime insulin doses  However she has gained a significant amount of weight despite trying to exercise some, not consistent with diet especially in the evenings  Oral hypoglycemic drugs: Invokana 100 mg, metformin 1 g twice a day  Proper timing of medications in relation to meals: Yes.                   Monitors blood glucose:  less than once a day     Glucometer:  Accu-Chek           Blood Glucose readings from download:   Mean values apply above for all meters except median for One Touch  PRE-MEAL Fasting Lunch Dinner Bedtime Overall  Glucose range: 75-148  94-167      Mean/median: 97  101    104   POST-MEAL PC Breakfast PC Lunch PC Dinner  Glucose range:  82-120  192-200   Mean/median:   195     Diet: Usually low fat; Breakfast: cheese crackers, yogurt at various times.  Does eat 3 meals a day, Lunch 1-2 pm dinner 6 Exercise: She thinks she is walking a lot at work,  using her exercise bike at home   Wt Readings from Last 3 Encounters:  02/03/16 157 lb 8 oz (71.442 kg)  01/28/16 158 lb (71.668 kg)  11/04/15 151 lb (68.493 kg)   Lab Results  Component Value Date   HGBA1C 7.0* 01/31/2016   HGBA1C 6.6* 11/01/2015   HGBA1C 6.7* 08/02/2015   Lab Results  Component Value Date   MICROALBUR <0.7 01/31/2016   LDLCALC 122* 01/31/2016   CREATININE 0.85 01/31/2016    Lab on 01/31/2016  Component Date Value Ref Range Status  . Hgb A1c MFr Bld 01/31/2016 7.0* 4.6 - 6.5 % Final   Glycemic Control Guidelines for People  with Diabetes:Non Diabetic:  <6%Goal of Therapy: <7%Additional Action Suggested:  >8%   . Sodium 01/31/2016 139  135 - 145 mEq/L Final  . Potassium 01/31/2016 4.6  3.5 - 5.1 mEq/L Final  . Chloride 01/31/2016 102  96 - 112 mEq/L Final  . CO2 01/31/2016 32  19 - 32 mEq/L Final  . Glucose, Bld 01/31/2016 107* 70 - 99 mg/dL Final  . BUN 01/31/2016 13  6 - 23 mg/dL Final  . Creatinine, Ser 01/31/2016 0.85  0.40 - 1.20 mg/dL Final  . Total Bilirubin 01/31/2016 0.3  0.2 - 1.2 mg/dL Final  . Alkaline Phosphatase 01/31/2016 111  39 - 117 U/L Final  . AST 01/31/2016 18  0 - 37 U/L Final  . ALT 01/31/2016 19  0 - 35 U/L Final  . Total Protein 01/31/2016 6.7  6.0 - 8.3 g/dL Final  . Albumin 01/31/2016 3.9  3.5 - 5.2 g/dL Final  . Calcium 01/31/2016 9.3  8.4 - 10.5  mg/dL Final  . GFR 01/31/2016 88.27  >60.00 mL/min Final  . Cholesterol 01/31/2016 197  0 - 200 mg/dL Final   ATP III Classification       Desirable:  < 200 mg/dL               Borderline High:  200 - 239 mg/dL          High:  > = 240 mg/dL  . Triglycerides 01/31/2016 41.0  0.0 - 149.0 mg/dL Final   Normal:  <150 mg/dLBorderline High:  150 - 199 mg/dL  . HDL 01/31/2016 66.90  >39.00 mg/dL Final  . VLDL 01/31/2016 8.2  0.0 - 40.0 mg/dL Final  . LDL Cholesterol 01/31/2016 122* 0 - 99 mg/dL Final  . Total CHOL/HDL Ratio 01/31/2016 3   Final                  Men          Women1/2 Average Risk     3.4          3.3Average Risk          5.0          4.42X Average Risk          9.6          7.13X Average Risk          15.0          11.0                      . NonHDL 01/31/2016 130.38   Final   NOTE:  Non-HDL goal should be 30 mg/dL higher than patient's LDL goal (i.e. LDL goal of < 70 mg/dL, would have non-HDL goal of < 100 mg/dL)  . Microalb, Ur 01/31/2016 <0.7  0.0 - 1.9 mg/dL Final  . Creatinine,U 01/31/2016 141.2   Final  . Microalb Creat Ratio 01/31/2016 0.5  0.0 - 30.0 mg/g Final  Office Visit on 01/28/2016  Component Date Value Ref Range Status  . Vitamin B-12 01/28/2016 434  211 - 946 pg/mL Final  . Folate 01/28/2016 14.8  >3.0 ng/mL Final   Comment: A serum folate concentration of less than 3.1 ng/mL is considered to represent clinical deficiency.   Marland Kitchen TSH 01/28/2016 0.933  0.450 - 4.500 uIU/mL Final  . T4, Total 01/28/2016 4.6  4.5 - 12.0 ug/dL Final  . T3 Uptake Ratio 01/28/2016 28  24 - 39 % Final  . Free  Thyroxine Index 01/28/2016 1.3  1.2 - 4.9 Final  . HIV Screen 4th Generation wRfx 01/28/2016 Non Reactive  Non Reactive Final  . RPR Ser Ql 01/28/2016 Non Reactive  Non Reactive Final      Medication List       This list is accurate as of: 02/03/16  4:13 PM.  Always use your most recent med list.               accu-chek soft touch lancets  Use as instructed to check blood  sugars daily dx 250.01     amphetamine-dextroamphetamine 30 MG 24 hr capsule  Commonly known as:  ADDERALL XR  Take by mouth every morning.     ARIPiprazole 10 MG tablet  Commonly known as:  ABILIFY  Take 10 mg by mouth daily.     aspirin 81 MG tablet  Take 81 mg by mouth daily.     celecoxib 200 MG capsule  Commonly known as:  CELEBREX  Take 200 mg by mouth daily.     cetirizine 10 MG tablet  Commonly known as:  ZYRTEC  Take 10 mg by mouth daily as needed for allergies.     Clobetasol Propionate 0.05 % lotion     ergocalciferol 50000 units capsule  Commonly known as:  VITAMIN D2  Take 50,000 Units by mouth once a week.     esomeprazole 40 MG capsule  Commonly known as:  NEXIUM  Take 40 mg by mouth daily before breakfast.     ezetimibe-simvastatin 10-80 MG tablet  Commonly known as:  VYTORIN  Take 1 tablet by mouth at bedtime.     FERREX 150 PO  Take 150 mg by mouth daily.     FLUOCINOLONE ACETONIDE SCALP 0.01 % Oil     furosemide 80 MG tablet  Commonly known as:  LASIX  Take 80 mg by mouth daily.     gabapentin 600 MG tablet  Commonly known as:  NEURONTIN  2 tablets twice daily     glucose blood test strip  Commonly known as:  ONETOUCH VERIO  Use as instructed to check blood sugar once a day dx code E11.49     imipramine 50 MG tablet  Commonly known as:  TOFRANIL  Take 150 mg by mouth at bedtime.     Insulin Glargine 100 UNIT/ML Solostar Pen  Commonly known as:  LANTUS SOLOSTAR  Inject 26 Units into the skin at bedtime.     insulin lispro 100 UNIT/ML KiwkPen  Commonly known as:  HUMALOG  Inject 3 Units into the skin 3 (three) times daily.     Insulin Pen Needle 31G X 5 MM Misc  Commonly known as:  B-D UF III MINI PEN NEEDLES  Use 4 per day     INVOKANA 100 MG Tabs tablet  Generic drug:  canagliflozin  TAKE 1 TABLET DAILY     KLOR-CON M20 20 MEQ tablet  Generic drug:  potassium chloride SA  Take 40 mEq by mouth daily.     metFORMIN 1000 MG  tablet  Commonly known as:  GLUCOPHAGE  Take 1 tablet (1,000 mg total) by mouth 2 (two) times daily with a meal.     NUQUIN HP 4 % Crea     PRISTIQ 50 MG 24 hr tablet  Generic drug:  desvenlafaxine     telmisartan 80 MG tablet  Commonly known as:  MICARDIS  Take 80 mg by mouth daily.     tiZANidine 2 MG tablet  Commonly known as:  ZANAFLEX  Take 2 mg by mouth at bedtime.     triamcinolone 55 MCG/ACT Aero nasal inhaler  Commonly known as:  NASACORT  Place 2 sprays into the nose daily.        Allergies: No Known Allergies  Past Medical History  Diagnosis Date  . Depression   . Dyslipidemia   . Bipolar disorder ([place])   . Vitamin D deficiency   . GERD (gastroesophageal reflux disease)   . Anemia   . Diabetic neuropathy ([facility])     feet  . Osteoarthritis     knees  . ADHD (attention deficit hyperactivity disorder)   . Anxiety   . Hypertension     states under control with med., has been on med. x 20 yrs.  Marland Kitchen SLEEP APNEA     uses CPAP nightly  . Insulin dependent diabetes mellitus ([place])   . Carpal tunnel syndrome of right wrist 09/2014  . Cataract, immature     Past Surgical History  Procedure Laterality Date  . Laparoscopic gastric banding  12/05/2005  . Esophagogastroduodenoscopy  02/03/2004  . Colonoscopy with propofol  10/27/2013  . Breast reduction surgery Bilateral   . Carpal tunnel release Right 10/08/2014    Procedure: RIGHT CARPAL TUNNEL RELEASE;  Surgeon: Daryll Brod, MD;  Location: [place];  Service: Orthopedics;  Laterality: Right;    Family History  Problem Relation Age of Onset  . Heart disease Father   . Breast cancer Sister   . Diabetes Sister   . Colon cancer Neg Hx   . Dementia Neg Hx   . Sarcoidosis Brother   . Cancer Brother     prostate  . Heart failure Mother     Social History:  reports that she has never smoked. She has never used smokeless tobacco. She reports that she does not drink alcohol or use illicit  drugs.  Review of Systems:  She has a history of obesity, previously weight was 250, treated with a LAP-BAND.   HYPERTENSION:   This in mild and treated with Micardis by PCP    HYPERLIPIDEMIA: The lipid abnormality consists of elevated LDL.  Has been given Vytorin by PCP and LDL previously has been rather low, she has no history of CAD Her LDL is significant higher now at 122 She thinks that she tends to forget her medication at times Also her diet is not optimal with eating foods like bacon and other high-fat meats   Lab Results  Component Value Date   CHOL 197 01/31/2016   HDL 66.90 01/31/2016   LDLCALC 122* 01/31/2016   TRIG 41.0 01/31/2016   CHOLHDL 3 01/31/2016    No history of recent leg edema; she is on 40 mg Lasix now  Has some pain in legs and feet from neuropathy and is taking large doses of gabapentin partly for depression; does have mild sensory loss on exam   Diabetic foot exam done in 6/17 Has mildly reduced monofilament sensation on the left side normal pedal pulses  She is asking about some rash and itching on her arms, recently treated by PCP with steroid cream     Examination:   BP 109/63 mmHg  Pulse 97  Temp(Src) 98.9 F (37.2 C) (Oral)  Resp 16  Ht 5\' 4"  (1.626 m)  Wt 157 lb 8 oz (71.442 kg)  BMI 27.02 kg/m2  SpO2 97%  Body mass index is 27.02 kg/(m^2).   Diabetic Foot Exam - Simple  Simple Foot Form  Diabetic Foot exam was performed with the following findings:  Yes   Visual Inspection  No deformities, no ulcerations, no other skin breakdown bilaterally:  Yes  Sensation Testing  See comments:  Yes  Pulse Check  Posterior Tibialis and Dorsalis pulse intact bilaterally:  Yes  Comments  Monofilament sensation relatively less on the left distal toes but not absent     No ankle edema   ASSESSMENT/ PLAN:   Diabetes type 2  See history of present illness for detailed discussion of current management, blood sugar patterns and problems  identified  She has gained weight since her last visit and on her A1c is up to 7 compared 6.6 Appears to have mostly high readings after evening meal although not checking enough at that time Fasting readings are fairly good with 16 units of Lantus She does not consistently watch her diet with high-fat foods at times She is trying to be active as possible  For now will have her increase her suppertime mealtime insulin to 6 units but also watch her diet better Given her list of high fat foods to avoid  NEUROPATHY: Fair control of symptoms with  gabapentin, stable foot exam  HYPERLIPIDEMIA: Not well controlled partly from noncompliance with her medication and also diet, discussed that she can take her Vytorin in the morning for better compliance She was given information on high saturated fat foods to avoid  HYPERTENSION: The blood pressure is controlled   Counseling time on subjects discussed above is over 50% of today's 25 minute visit   Amy [name] 02/03/2016, 4:13 PM

## 2016-02-03 NOTE — Progress Notes (Signed)
Pre visit review using our clinic review tool, if applicable. No additional management support is needed unless otherwise documented below in the visit note. 

## 2016-02-03 NOTE — Patient Instructions (Signed)
Check blood sugars on waking up 3  times a week  Also check blood sugars about 2 hours after a meal and do this after different meals by rotation  Recommended blood sugar levels on waking up is 90-130 and about 2 hours after meal is 130-160  Please bring your blood sugar monitor to each visit, thank you  6 Humalog at supper  Diet as advised

## 2016-02-09 ENCOUNTER — Ambulatory Visit (INDEPENDENT_AMBULATORY_CARE_PROVIDER_SITE_OTHER): Payer: Federal, State, Local not specified - PPO

## 2016-02-09 DIAGNOSIS — R5383 Other fatigue: Secondary | ICD-10-CM | POA: Diagnosis not present

## 2016-02-09 DIAGNOSIS — R413 Other amnesia: Secondary | ICD-10-CM | POA: Diagnosis not present

## 2016-02-09 DIAGNOSIS — R404 Transient alteration of awareness: Secondary | ICD-10-CM | POA: Diagnosis not present

## 2016-02-09 DIAGNOSIS — F05 Delirium due to known physiological condition: Secondary | ICD-10-CM | POA: Diagnosis not present

## 2016-02-09 DIAGNOSIS — R410 Disorientation, unspecified: Secondary | ICD-10-CM

## 2016-02-10 MED ORDER — GADOPENTETATE DIMEGLUMINE 469.01 MG/ML IV SOLN: 14.0000 mL | Freq: Once | INTRAVENOUS | Status: AC | PRN

## 2016-02-14 ENCOUNTER — Telehealth: Payer: Self-pay | Admitting: *Deleted

## 2016-02-14 NOTE — Telephone Encounter (Signed)
-----   Message from Melvenia Beam, MD sent at 02/13/2016  9:11 PM EDT ----- MRI of the brain normal for age no change from one done in 2014 thanks

## 2016-02-14 NOTE — Telephone Encounter (Signed)
Called and spoke to husband about results per Dr Jaynee Eagles note. Pt at work. He verbalized understanding.

## 2016-02-23 ENCOUNTER — Ambulatory Visit (INDEPENDENT_AMBULATORY_CARE_PROVIDER_SITE_OTHER): Payer: Federal, State, Local not specified - PPO | Admitting: Neurology

## 2016-02-23 ENCOUNTER — Telehealth: Payer: Self-pay | Admitting: Endocrinology

## 2016-02-23 DIAGNOSIS — R404 Transient alteration of awareness: Secondary | ICD-10-CM

## 2016-02-23 DIAGNOSIS — R410 Disorientation, unspecified: Secondary | ICD-10-CM | POA: Diagnosis not present

## 2016-02-23 MED ORDER — INSULIN GLARGINE 100 UNIT/ML SOLOSTAR PEN: 26.0000 [IU] | PEN_INJECTOR | Freq: Every day | SUBCUTANEOUS | Status: DC

## 2016-02-23 MED ORDER — METFORMIN HCL 1000 MG PO TABS: 1000.0000 mg | ORAL_TABLET | Freq: Two times a day (BID) | ORAL | Status: DC

## 2016-02-23 MED ORDER — CANAGLIFLOZIN 100 MG PO TABS: 100.0000 mg | ORAL_TABLET | Freq: Every day | ORAL | Status: DC

## 2016-02-23 MED ORDER — GLUCOSE BLOOD VI STRP: ORAL_STRIP | Status: DC

## 2016-02-23 NOTE — Telephone Encounter (Signed)
Rx submitted

## 2016-02-23 NOTE — Procedures (Signed)
    History:  Amy [name] is a 60 year old patient with a history of some memory problems. The patient had an event where she fell and hit her head in the garage one or 2 years ago. The patient believes that she has had some memory issues since that time. The patient at times feels that she slurs her speech. She has some underlying depression and anxiety. She is being evaluated for the above symptoms.  This is a routine EEG. No skull defects are noted. Medications include Adderall, Abilify, aspirin, Celebrex, Zyrtec, vitamin D, Nexium, Vytorin, Lasix, gabapentin, imipramine, insulin, Invokana, potassium supplementation, metformin, Zanaflex, Pristiq, Nasacort, and Micardis.   EEG classification: Normal awake and asleep  Description of the recording: The background rhythms of this recording consists of a fairly well modulated medium amplitude background activity of 9 Hz. As the record progresses, the patient initially is in the waking state, but appears to enter the early stage II sleep during the recording, with rudimentary sleep spindles and vertex sharp wave activity seen. Photic stimulation was not performed. Hyperventilation was performed, and this results in a minimal buildup of the background rhythm activities without significant slowing seen. At no time during the recording does there appear to be evidence of spike or spike wave discharges or evidence of focal slowing. EKG monitor shows no evidence of cardiac rhythm abnormalities with a heart rate of 90.  Impression: This is a normal EEG recording in the waking and sleeping state. No evidence of ictal or interictal discharges were seen at any time during the recording.

## 2016-02-23 NOTE — Telephone Encounter (Signed)
[facility] [address] [facility] invokana and metformin and brand name lantus

## 2016-02-23 NOTE — Addendum Note (Signed)
Addended by: Verlin Grills T on: 02/23/2016 04:53 PM   Modules accepted: Orders

## 2016-02-23 NOTE — Telephone Encounter (Signed)
[facility] in test [facility] for her one touch meter verio

## 2016-02-25 ENCOUNTER — Telehealth: Payer: Self-pay

## 2016-02-25 NOTE — Telephone Encounter (Signed)
-----   Message from Melvenia Beam, MD sent at 02/24/2016  5:43 PM EDT ----- EEG normal. No further eeg testing needed at this time thanks

## 2016-02-25 NOTE — Telephone Encounter (Signed)
Rn call patient about her EEG results. Rn stated her EEG was normal, and no further EEG testing is required at this time. Pt verbalized understanding and wants her EEG results sent to the referring psych MD.

## 2016-02-28 ENCOUNTER — Telehealth: Payer: Self-pay | Admitting: Endocrinology

## 2016-02-28 MED ORDER — METFORMIN HCL 1000 MG PO TABS: 1000.0000 mg | ORAL_TABLET | Freq: Two times a day (BID) | ORAL | Status: DC

## 2016-02-28 MED ORDER — CANAGLIFLOZIN 100 MG PO TABS: 100.0000 mg | ORAL_TABLET | Freq: Every day | ORAL | Status: DC

## 2016-02-28 NOTE — Telephone Encounter (Signed)
PT stated she needs her Invokana and Metformin sent to [facility]

## 2016-02-28 NOTE — Telephone Encounter (Signed)
Both [place] require a PA.

## 2016-02-28 NOTE — Telephone Encounter (Signed)
Need PA for Invokana

## 2016-02-28 NOTE — Telephone Encounter (Signed)
Rx's originally submitted on 02/22/2016. Rx resubmitted.

## 2016-02-28 NOTE — Telephone Encounter (Signed)
See below and please advise how to proceed.

## 2016-02-28 NOTE — Telephone Encounter (Signed)
PT called back, stated Invokana needs PA.  PT is out of medication, needs it done ASAP

## 2016-02-28 NOTE — Telephone Encounter (Signed)
Needs to find out if [place] or [place] are covered

## 2016-03-01 LAB — HM DIABETES EYE EXAM

## 2016-03-01 NOTE — Telephone Encounter (Signed)
Pa submitted for Invokana. Requested a call back from the pt to notify PA has been submitted.

## 2016-03-02 ENCOUNTER — Telehealth: Payer: Self-pay | Admitting: Endocrinology

## 2016-03-02 ENCOUNTER — Telehealth: Payer: Self-pay

## 2016-03-02 NOTE — Telephone Encounter (Signed)
Rn fax copy of EEG and MRI to Dr. [facility] pts psychologist at her request. Phone number is [phone]. Fax is [phone]. Report was fax twice and receive.Marland Kitchen

## 2016-03-02 NOTE — Telephone Encounter (Signed)
I contacted the pt and left a vm advising PA for invokanna has been sent. Pt advised we will contact her once we have a response. Pt voiced understanding.

## 2016-03-02 NOTE — Telephone Encounter (Signed)
Patient need a PA of medication Invokana.

## 2016-03-02 NOTE — Telephone Encounter (Signed)
[facility]

## 2016-03-06 ENCOUNTER — Telehealth: Payer: Self-pay | Admitting: Endocrinology

## 2016-03-06 NOTE — Telephone Encounter (Signed)
Pt is becoming overwhelmed with the different info she is receiving from her pharmacy  Please contact her pharmacy to get clarification on what is actually needed so we can get this pt the invokana.

## 2016-03-06 NOTE — Telephone Encounter (Signed)
The [phone] number is for the PA   The P# for mail order [phone] [facility]

## 2016-03-06 NOTE — Telephone Encounter (Signed)
Pt is calling for the retail pharmacy to receive her invokana rx please call it into P# [phone] F [phone]

## 2016-03-06 NOTE — Telephone Encounter (Signed)
I contacted the pt and advised we have  Submitted the PA for invokana and will contact her once we receive feed back from the insurance company. Pt voiced understanding.

## 2016-03-07 MED ORDER — CANAGLIFLOZIN 100 MG PO TABS: 100.0000 mg | ORAL_TABLET | Freq: Every day | ORAL | Status: DC

## 2016-03-07 NOTE — Addendum Note (Signed)
Addended by: Verlin Grills T on: 03/07/2016 01:57 PM   Modules accepted: Orders

## 2016-03-07 NOTE — Telephone Encounter (Signed)
I contacted the pt and advised we have gotten the PA for Invokanna approve and the approval is current until 03/07/2017. Pt voiced understanding and requested a 15 day supply to be sent to her local pharmacy and a 90 day supply to her mail order. Rx has been submitted pt voiced understanding.

## 2016-04-11 ENCOUNTER — Other Ambulatory Visit: Payer: Self-pay

## 2016-04-11 MED ORDER — GABAPENTIN 600 MG PO TABS: ORAL_TABLET | ORAL | 0 refills | Status: DC

## 2016-04-17 ENCOUNTER — Encounter: Payer: Self-pay | Admitting: Endocrinology

## 2016-04-20 ENCOUNTER — Telehealth: Payer: Self-pay | Admitting: Endocrinology

## 2016-04-20 NOTE — Telephone Encounter (Signed)
Gabapentin needs refills called to [facility]  Test strips needs refills to [facility] as well

## 2016-05-02 ENCOUNTER — Other Ambulatory Visit: Payer: Federal, State, Local not specified - PPO

## 2016-05-03 ENCOUNTER — Other Ambulatory Visit: Payer: Self-pay

## 2016-05-03 ENCOUNTER — Telehealth: Payer: Self-pay | Admitting: Family Medicine

## 2016-05-03 ENCOUNTER — Other Ambulatory Visit (INDEPENDENT_AMBULATORY_CARE_PROVIDER_SITE_OTHER): Payer: Federal, State, Local not specified - PPO

## 2016-05-03 DIAGNOSIS — Z794 Long term (current) use of insulin: Secondary | ICD-10-CM

## 2016-05-03 DIAGNOSIS — E1165 Type 2 diabetes mellitus with hyperglycemia: Secondary | ICD-10-CM | POA: Diagnosis not present

## 2016-05-03 DIAGNOSIS — E785 Hyperlipidemia, unspecified: Secondary | ICD-10-CM | POA: Diagnosis not present

## 2016-05-03 LAB — COMPREHENSIVE METABOLIC PANEL
ALBUMIN: 4.3 g/dL (ref 3.5–5.2)
ALT: 42 U/L — ABNORMAL HIGH (ref 0–35)
AST: 31 U/L (ref 0–37)
Alkaline Phosphatase: 121 U/L — ABNORMAL HIGH (ref 39–117)
BUN: 11 mg/dL (ref 6–23)
CHLORIDE: 100 meq/L (ref 96–112)
CO2: 34 meq/L — AB (ref 19–32)
CREATININE: 0.99 mg/dL (ref 0.40–1.20)
Calcium: 9.5 mg/dL (ref 8.4–10.5)
GFR: 73.97 mL/min (ref >60.00)
Glucose, Bld: 136 mg/dL — ABNORMAL HIGH (ref 70–99)
Potassium: 4 mEq/L (ref 3.5–5.1)
SODIUM: 138 meq/L (ref 135–145)
Total Bilirubin: 0.4 mg/dL (ref 0.2–1.2)
Total Protein: 7.3 g/dL (ref 6.0–8.3)

## 2016-05-03 LAB — LIPID PANEL
CHOL/HDL RATIO: 2
Cholesterol: 116 mg/dL (ref 0–200)
HDL: 64.4 mg/dL (ref >39.00)
LDL CALC: 33 mg/dL (ref 0–99)
NONHDL: 51.14
Triglycerides: 91 mg/dL (ref 0.0–149.0)
VLDL: 18.2 mg/dL (ref 0.0–40.0)

## 2016-05-03 LAB — HEMOGLOBIN A1C: Hgb A1c MFr Bld: 7.4 % — ABNORMAL HIGH (ref 4.6–6.5)

## 2016-05-03 MED ORDER — GLUCOSE BLOOD VI STRP: ORAL_STRIP | 3 refills | Status: DC

## 2016-05-03 NOTE — Telephone Encounter (Signed)
Pt came in today stating she received her gabapentin but didn't get her test strips  Please sent to [facility] She has about 2 days left  Please advise pt when this has been called in  Best number [phone]

## 2016-05-03 NOTE — Telephone Encounter (Signed)
Called and made pt aware that we have no openings for sooner apt- pt stated she understood

## 2016-05-03 NOTE — Telephone Encounter (Signed)
Pt had to r/s her appointment form 9/8.  This was r/s to 10/4 pt wanted to know if she could be seen sooner Best number [phone]

## 2016-05-03 NOTE — Telephone Encounter (Signed)
Ordered test strips- called pt to let her know they have been ordered

## 2016-05-04 ENCOUNTER — Encounter: Payer: Self-pay | Admitting: *Deleted

## 2016-05-05 ENCOUNTER — Ambulatory Visit: Payer: Federal, State, Local not specified - PPO | Admitting: Endocrinology

## 2016-05-24 ENCOUNTER — Other Ambulatory Visit: Payer: Self-pay | Admitting: Physician Assistant

## 2016-05-24 ENCOUNTER — Ambulatory Visit
Admission: RE | Admit: 2016-05-24 | Discharge: 2016-05-24 | Disposition: A | Discharge status: Home / Self Care | Payer: Federal, State, Local not specified - PPO | Source: Ambulatory Visit | Attending: Physician Assistant | Admitting: Physician Assistant

## 2016-05-24 DIAGNOSIS — Z4651 Encounter for fitting and adjustment of gastric lap band: Secondary | ICD-10-CM

## 2016-05-31 ENCOUNTER — Encounter: Payer: Self-pay | Admitting: Endocrinology

## 2016-05-31 ENCOUNTER — Other Ambulatory Visit: Payer: Self-pay

## 2016-05-31 ENCOUNTER — Ambulatory Visit (INDEPENDENT_AMBULATORY_CARE_PROVIDER_SITE_OTHER): Payer: Federal, State, Local not specified - PPO | Admitting: Endocrinology

## 2016-05-31 VITALS — BP 112/68 | HR 79 | Ht 64.0 in | Wt 165.0 lb

## 2016-05-31 DIAGNOSIS — E782 Mixed hyperlipidemia: Secondary | ICD-10-CM

## 2016-05-31 DIAGNOSIS — E1165 Type 2 diabetes mellitus with hyperglycemia: Secondary | ICD-10-CM

## 2016-05-31 DIAGNOSIS — Z794 Long term (current) use of insulin: Secondary | ICD-10-CM | POA: Diagnosis not present

## 2016-05-31 MED ORDER — LIRAGLUTIDE 18 MG/3ML ~~LOC~~ SOPN: 1.2000 mg | PEN_INJECTOR | Freq: Every day | SUBCUTANEOUS | 4 refills | Status: DC

## 2016-05-31 NOTE — Patient Instructions (Signed)
Start VICTOZA injection as shown once daily at the same time of the day.  Dial the dose to 0.6 mg on the pen for the first week.  You may inject in the stomach, thigh or arm. You may experience nausea in the first few days which usually goes away.   You will feel fullness of the stomach with starting the medication and should try to keep the portions at meals small. After 1 week increase the dose to 1.2mg  daily if no nausea present.   If any questions or concerns are present call the office or the [place] helpline at [phone]. Visit [email or web address].info/ for more useful information  Take 8 Humalog at supper  Cut down 2 units on insulin when sugars <90

## 2016-05-31 NOTE — Progress Notes (Signed)
Patient ID: Amy [name], female   DOB: 1955/09/05, 60 y.o.   MRN: [id number]   Reason for Appointment:  Followup of diabetes  History of Present Illness   Diagnosis: Type 2 DIABETES MELITUS, date of diagnosis 1993   Prior history: Her diabetes has been treated for several years with basal bolus insulin and also metformin She usually has had A1c results near normal and is fairly compliant with her basal bolus regimen She continues to be on various atypical antipsychotic drugs also However her blood sugars were poorly controlled in 03/2013 Subsequently with a trial of Victoza she did not have much improvement in blood sugars and control of her increased appetite or weight gain Victoza was stopped in 2/15 when she was having excessive weight loss  Recent history:  Insulin regimen: Humalog breakfast 3 units, lunch 4-6; supper 6 . Lantus insulin 26 units hs        She is on basal bolus insulin regimen along with Invokana 100 mg and metformin  A1c is getting progressively higher this year, now 7.4 and has been as low as 6.6  Current management, blood sugar patterns and problems identified:  She did not bring her monitor for download today  Not clear if her blood sugars are consistently at different times  She is checking her blood sugars about once a day and probably only occasionally after meals  She thinks her highest readings are AFTER supper and usually at least 280 and sometimes over 200.  Despite increasing her suppertime Humalog by 2 units on the last visit her blood sugars are again higher after eating  She thinks her fasting readings are not increased  LANTUS dose has been increased, not clear when she did so as she was previously taking only 16 units  WEIGHT continues to go up this year since her LAP-BAND was probably loosened because of reflux symptoms.  She is tending to eat larger portions now.  Not clear what her blood sugars are during the day  Oral  hypoglycemic drugs: Invokana 100 mg, metformin 1 g twice a day  Proper timing of medications in relation to meals: Yes.                  Monitors blood glucose:  less than once a day     Glucometer:  Accu-Chek           Blood Glucose readings from recall:   Mean values apply above for all meters except median for One Touch  PRE-MEAL Fasting Lunch Dinner Bedtime Overall  Glucose range: 120   180-235   Mean/median:        Diet: Usually low fat; Breakfast: cheese crackers, yogurt at various times.  Does eat 2-3 meals a day, Lunch 1-2 pm And may be light, dinner 6 PM Exercise: Some walking at work,  using her exercise bike at home also   Wt Readings from Last 3 Encounters:  05/31/16 165 lb (74.8 kg)  02/03/16 157 lb 8 oz (71.4 kg)  01/28/16 158 lb (71.7 kg)   Lab Results  Component Value Date   HGBA1C 7.4 (H) 05/03/2016   HGBA1C 7.0 (H) 01/31/2016   HGBA1C 6.6 (H) 11/01/2015   Lab Results  Component Value Date   MICROALBUR <0.7 01/31/2016   LDLCALC 33 05/03/2016   CREATININE 0.99 05/03/2016    No visits with results within 1 Week(s) from this visit.  Latest known visit with results is:  Abstract on 05/04/2016  Component Date Value Ref Range Status  . HM Diabetic Eye Exam 03/01/2016 No Retinopathy  No Retinopathy Final      Medication List       Accurate as of 05/31/16  4:43 PM. Always use your most recent med list.          accu-chek soft touch lancets Use as instructed to check blood sugars daily dx 250.01   amphetamine-dextroamphetamine 30 MG 24 hr capsule Commonly known as:  ADDERALL XR Take by mouth every morning.   ARIPiprazole 10 MG tablet Commonly known as:  ABILIFY Take 10 mg by mouth daily.   aspirin 81 MG tablet Take 81 mg by mouth daily.   canagliflozin 100 MG Tabs tablet Commonly known as:  INVOKANA Take 1 tablet (100 mg total) by mouth daily.   celecoxib 200 MG capsule Commonly known as:  CELEBREX Take 200 mg by mouth daily.     cetirizine 10 MG tablet Commonly known as:  ZYRTEC Take 10 mg by mouth daily as needed for allergies.   Clobetasol Propionate 0.05 % lotion   ergocalciferol 50000 units capsule Commonly known as:  VITAMIN D2 Take 50,000 Units by mouth once a week.   esomeprazole 40 MG capsule Commonly known as:  NEXIUM Take 40 mg by mouth daily before breakfast.   ezetimibe-simvastatin 10-80 MG tablet Commonly known as:  VYTORIN Take 1 tablet by mouth at bedtime.   FERREX 150 PO Take 150 mg by mouth daily.   FLUOCINOLONE ACETONIDE SCALP 0.01 % Oil   furosemide 80 MG tablet Commonly known as:  LASIX Take 80 mg by mouth daily.   gabapentin 600 MG tablet Commonly known as:  NEURONTIN 2 tablets twice daily   glucose blood test strip Commonly known as:  ONETOUCH VERIO Use as instructed to check blood sugar once a day dx code E11.49   imipramine 50 MG tablet Commonly known as:  TOFRANIL Take 150 mg by mouth at bedtime.   Insulin Glargine 100 UNIT/ML Solostar Pen Commonly known as:  LANTUS SOLOSTAR Inject 26 Units into the skin at bedtime.   insulin lispro 100 UNIT/ML KiwkPen Commonly known as:  HUMALOG Inject 3 Units into the skin 3 (three) times daily.   Insulin Pen Needle 31G X 5 MM Misc Commonly known as:  B-D UF III MINI PEN NEEDLES Use 4 per day   KLOR-CON M20 20 MEQ tablet Generic drug:  potassium chloride SA Take 40 mEq by mouth daily.   metFORMIN 1000 MG tablet Commonly known as:  GLUCOPHAGE Take 1 tablet (1,000 mg total) by mouth 2 (two) times daily with a meal.   NUQUIN HP 4 % Crea   PRISTIQ 50 MG 24 hr tablet Generic drug:  desvenlafaxine   telmisartan 80 MG tablet Commonly known as:  MICARDIS Take 80 mg by mouth daily.   tiZANidine 2 MG tablet Commonly known as:  ZANAFLEX Take 2 mg by mouth at bedtime.   triamcinolone 55 MCG/ACT Aero nasal inhaler Commonly known as:  NASACORT Place 2 sprays into the nose daily.       Allergies: No Known  Allergies  Past Medical History:  Diagnosis Date  . ADHD (attention deficit hyperactivity disorder)   . Anemia   . Anxiety   . Bipolar disorder ([place])   . Carpal tunnel syndrome of right wrist 09/2014  . Cataract, immature   . Depression   . Diabetic neuropathy (HCC)    feet  . Dyslipidemia   . GERD (gastroesophageal reflux disease)   .  Hypertension    states under control with med., has been on med. x 20 yrs.  . Insulin dependent diabetes mellitus ([place])   . Osteoarthritis    knees  . SLEEP APNEA    uses CPAP nightly  . Vitamin D deficiency     Past Surgical History:  Procedure Laterality Date  . BREAST REDUCTION SURGERY Bilateral   . CARPAL TUNNEL RELEASE Right 10/08/2014   Procedure: RIGHT CARPAL TUNNEL RELEASE;  Surgeon: Daryll Brod, MD;  Location: [facility];  Service: Orthopedics;  Laterality: Right;  . COLONOSCOPY WITH PROPOFOL  10/27/2013  . ESOPHAGOGASTRODUODENOSCOPY  02/03/2004  . LAPAROSCOPIC GASTRIC BANDING  12/05/2005    Family History  Problem Relation Age of Onset  . Heart disease Father   . Breast cancer Sister   . Diabetes Sister   . Colon cancer Neg Hx   . Dementia Neg Hx   . Sarcoidosis Brother   . Cancer Brother     prostate  . Heart failure Mother     Social History:  reports that she has never smoked. She has never used smokeless tobacco. She reports that she does not drink alcohol or use drugs.  Review of Systems:   HYPERTENSION:   This is mild and treated with Micardis by PCP    HYPERLIPIDEMIA: The lipid abnormality consists of elevated LDL.  Has been given Vytorin by PCP and LDL previously has been rather low, she has no history of CAD  Improved with better compliance with her Vytorin and diet recently    Lab Results  Component Value Date   CHOL 116 05/03/2016   HDL 64.40 05/03/2016   LDLCALC 33 05/03/2016   TRIG 91.0 05/03/2016   CHOLHDL 2 05/03/2016    No history of recent leg edema; she is on 40 mg Lasix  now  Has some pain in legs and feet from neuropathy and is taking large doses of gabapentin partly for depression; does have mild sensory loss on exam   Diabetic foot exam done in 6/17 Has mildly reduced monofilament sensation on the left side normal pedal pulses  She is asking about some rash and itching on her arms, recently treated by PCP with steroid cream     Examination:   BP 112/68   Pulse 79   Ht 5\' 4"  (1.626 m)   Wt 165 lb (74.8 kg)   BMI 28.32 kg/m   Body mass index is 28.32 kg/m.   Diabetic Foot Exam - Simple   No data filed     No ankle edema   ASSESSMENT/ PLAN:   Diabetes type 2  See history of present illness for detailed discussion of current management, blood sugar patterns and problems identified  She has gained weight Again and has been progressively getting weight with not being able to get enough satiety that she was having with her LAP-BAND loosened recently Also blood sugars are continuing to be higher after meals especially evening meal Not clear about exact readings as she did not bring her monitor A1c continues to go up gradually as also her insulin doses  especially basal  Recommendations:  In order to help her control portions and increase satiety she will go back to Victoza which she took 2 years ago and was stopped because of weight loss at that time.  However she does not remember how to do this and the treatment regimen was explained in detail as well as titration, side effects, safety and timing of injection  She will increase her suppertime dose by at least 2 units  Discussed reducing all insulin doses gradually if blood sugars are coming down below 90 pre-meal  Consistent exercise  NEUROPATHY: Fair control of symptoms with  gabapentin  HYPERLIPIDEMIA: Improved with better compliance with her Vytorin and diet  HYPERTENSION: The blood pressure is well controlled   Counseling time on subjects discussed above is over 50% of today's  25 minute visit  Patient Instructions  Start VICTOZA injection as shown once daily at the same time of the day.  Dial the dose to 0.6 mg on the pen for the first week.  You may inject in the stomach, thigh or arm. You may experience nausea in the first few days which usually goes away.   You will feel fullness of the stomach with starting the medication and should try to keep the portions at meals small. After 1 week increase the dose to 1.2mg  daily if no nausea present.   If any questions or concerns are present call the office or the [place] helpline at [phone]. Visit [email or web address].info/ for more useful information  Take 8 Humalog at supper  Cut down 2 units on insulin when sugars <90     Ynez Eugenio 05/31/2016, 4:43 PM

## 2016-06-09 ENCOUNTER — Other Ambulatory Visit (HOSPITAL_COMMUNITY): Payer: Self-pay | Admitting: Surgery

## 2016-06-09 DIAGNOSIS — Z9884 Bariatric surgery status: Secondary | ICD-10-CM

## 2016-06-16 ENCOUNTER — Ambulatory Visit (HOSPITAL_COMMUNITY)
Admission: RE | Admit: 2016-06-16 | Discharge: 2016-06-16 | Disposition: A | Discharge status: Home / Self Care | Payer: Federal, State, Local not specified - PPO | Source: Ambulatory Visit | Attending: Surgery | Admitting: Surgery

## 2016-06-16 DIAGNOSIS — K228 Other specified diseases of esophagus: Secondary | ICD-10-CM | POA: Diagnosis not present

## 2016-06-16 DIAGNOSIS — Z9884 Bariatric surgery status: Secondary | ICD-10-CM | POA: Diagnosis not present

## 2016-07-06 DIAGNOSIS — Z4651 Encounter for fitting and adjustment of gastric lap band: Secondary | ICD-10-CM | POA: Diagnosis not present

## 2016-07-07 DIAGNOSIS — Z9884 Bariatric surgery status: Secondary | ICD-10-CM | POA: Diagnosis not present

## 2016-07-10 DIAGNOSIS — Z23 Encounter for immunization: Secondary | ICD-10-CM | POA: Diagnosis not present

## 2016-07-10 DIAGNOSIS — E785 Hyperlipidemia, unspecified: Secondary | ICD-10-CM | POA: Diagnosis not present

## 2016-07-10 DIAGNOSIS — I10 Essential (primary) hypertension: Secondary | ICD-10-CM | POA: Diagnosis not present

## 2016-07-10 DIAGNOSIS — G5602 Carpal tunnel syndrome, left upper limb: Secondary | ICD-10-CM | POA: Diagnosis not present

## 2016-07-14 ENCOUNTER — Encounter: Payer: Self-pay | Admitting: Endocrinology

## 2016-07-14 ENCOUNTER — Ambulatory Visit (INDEPENDENT_AMBULATORY_CARE_PROVIDER_SITE_OTHER): Payer: Federal, State, Local not specified - PPO | Admitting: Endocrinology

## 2016-07-14 ENCOUNTER — Other Ambulatory Visit: Payer: Self-pay

## 2016-07-14 VITALS — BP 118/68 | HR 82 | Wt 168.0 lb

## 2016-07-14 DIAGNOSIS — Z794 Long term (current) use of insulin: Secondary | ICD-10-CM | POA: Diagnosis not present

## 2016-07-14 DIAGNOSIS — E1165 Type 2 diabetes mellitus with hyperglycemia: Secondary | ICD-10-CM | POA: Diagnosis not present

## 2016-07-14 MED ORDER — CANAGLIFLOZIN 300 MG PO TABS: 300.0000 mg | ORAL_TABLET | Freq: Every day | ORAL | 2 refills | Status: DC

## 2016-07-14 NOTE — Patient Instructions (Signed)
Invokana 200mg  in am  Micardis 1/2 daily  Lantus 26 units in am not nite  Humalog 8-10 at supper daily  Humalog 7-8 unit at lunch  Stop Victoza

## 2016-07-14 NOTE — Progress Notes (Signed)
Patient ID: YOUNIQUE MY, female   DOB: 1956/01/18, 60 y.o.   MRN: [id number]   Reason for Appointment:  Followup of diabetes  History of Present Illness   Diagnosis: Type 2 DIABETES MELITUS, date of diagnosis 1993   Prior history: Her diabetes has been treated for several years with basal bolus insulin and also metformin She usually has had A1c results near normal and is fairly compliant with her basal bolus regimen She continues to be on various atypical antipsychotic drugs also However her blood sugars were poorly controlled in 03/2013 Subsequently with a trial of Victoza she did not have much improvement in blood sugars and control of her increased appetite or weight gain Victoza was stopped in 2/15 when she was having excessive weight loss  Recent history:  Insulin regimen: Humalog breakfast 3 units, lunch 4-6; supper 6 . Lantus insulin 26 units hs        She is on basal bolus insulin regimen along with Invokana 100 mg and metformin  A1c is 7.4 as of 9/17 but previously has been at target For this reason she was started back on Victoza especially with her weight gain  Current management, blood sugar patterns and problems identified:  She did bring her monitor for download today  She was told to increase her suppertime insulin by at least 2 units but she is still taking 6 units of Humalog in her blood sugars are mostly high after supper, only occasionally are near normal  However overall checking blood sugars infrequently  Does not think her diabetes consistently causing higher sugars although last night glucose was high from eating ice cream  She does not take extra insulin when she is getting more carbohydrates  However also her blood sugars in the afternoons are mostly high Not clear if her blood sugars are consistently at different times  LANTUS dose has been increased, not clear when she did so as she was previously taking only 16 units  WEIGHT continues  to go up this year since her LAP-BAND was probably loosened because of reflux symptoms.   She does not think that Victoza is improving her satiety level   This is despite her reporting continuing her exercise regimen although not consistently  Not clear what her blood sugars are after reckless or before lunch and checking fasting readings only sporadically now  Oral hypoglycemic drugs: Invokana 100 mg, metformin 1 g twice a day  Proper timing of medications in relation to meals: Yes.                  Monitors blood glucose:  less than once a day     Glucometer:  Accu-Chek           Blood Glucose readings from download:  Mean values apply above for all meters except median for One Touch  PRE-MEAL Fasting Lunch Dinner Bedtime Overall  Glucose range: 107  72  153-176  123-336    Mean/median:    201 182    Diet: Usually low fat; Breakfast: cheese crackers, yogurt at various times.  Does eat 2-3 meals a day, Lunch 1-2 pm And may be light, dinner 6 PM  Exercise: Some walking at work,  using her exercise bike at home also   Wt Readings from Last 3 Encounters:  07/14/16 168 lb (76.2 kg)  05/31/16 165 lb (74.8 kg)  02/03/16 157 lb 8 oz (71.4 kg)   Lab Results  Component Value Date  HGBA1C 7.4 (H) 05/03/2016   HGBA1C 7.0 (H) 01/31/2016   HGBA1C 6.6 (H) 11/01/2015   Lab Results  Component Value Date   MICROALBUR <0.7 01/31/2016   LDLCALC 33 05/03/2016   CREATININE 0.99 05/03/2016    No visits with results within 1 Week(s) from this visit.  Latest known visit with results is:  Abstract on 05/04/2016  Component Date Value Ref Range Status  . HM Diabetic Eye Exam 03/01/2016 No Retinopathy  No Retinopathy Final      Medication List       Accurate as of 07/14/16 11:59 PM. Always use your most recent med list.          accu-chek soft touch lancets Use as instructed to check blood sugars daily dx 250.01   amphetamine-dextroamphetamine 30 MG 24 hr capsule Commonly known  as:  ADDERALL XR Take by mouth every morning.   ARIPiprazole 10 MG tablet Commonly known as:  ABILIFY Take 10 mg by mouth daily.   aspirin 81 MG tablet Take 81 mg by mouth daily.   canagliflozin 300 MG Tabs tablet Commonly known as:  INVOKANA Take 1 tablet (300 mg total) by mouth daily.   celecoxib 200 MG capsule Commonly known as:  CELEBREX Take 200 mg by mouth daily.   cetirizine 10 MG tablet Commonly known as:  ZYRTEC Take 10 mg by mouth daily as needed for allergies.   Clobetasol Propionate 0.05 % lotion   ergocalciferol 50000 units capsule Commonly known as:  VITAMIN D2 Take 50,000 Units by mouth once a week.   esomeprazole 40 MG capsule Commonly known as:  NEXIUM Take 40 mg by mouth daily before breakfast.   ezetimibe-simvastatin 10-80 MG tablet Commonly known as:  VYTORIN Take 1 tablet by mouth at bedtime.   FERREX 150 PO Take 150 mg by mouth daily.   Fluocinolone Acetonide Scalp 0.01 % Oil   furosemide 80 MG tablet Commonly known as:  LASIX Take 80 mg by mouth daily.   gabapentin 600 MG tablet Commonly known as:  NEURONTIN 2 tablets twice daily   glucose blood test strip Commonly known as:  ONETOUCH VERIO Use as instructed to check blood sugar once a day dx code E11.49   imipramine 50 MG tablet Commonly known as:  TOFRANIL Take 150 mg by mouth at bedtime.   Insulin Glargine 100 UNIT/ML Solostar Pen Commonly known as:  LANTUS SOLOSTAR Inject 26 Units into the skin at bedtime.   insulin lispro 100 UNIT/ML KiwkPen Commonly known as:  HUMALOG Inject 3 Units into the skin 3 (three) times daily.   Insulin Pen Needle 31G X 5 MM Misc Commonly known as:  B-D UF III MINI PEN NEEDLES Use 4 per day   KLOR-CON M20 20 MEQ tablet Generic drug:  potassium chloride SA Take 40 mEq by mouth daily.   liraglutide 18 MG/3ML Sopn Commonly known as:  VICTOZA Inject 0.2 mLs (1.2 mg total) into the skin daily.   metFORMIN 1000 MG tablet Commonly known as:   GLUCOPHAGE Take 1 tablet (1,000 mg total) by mouth 2 (two) times daily with a meal.   NUQUIN HP 4 % Crea   PRISTIQ 50 MG 24 hr tablet Generic drug:  desvenlafaxine   telmisartan 80 MG tablet Commonly known as:  MICARDIS Take 80 mg by mouth daily.   tiZANidine 2 MG tablet Commonly known as:  ZANAFLEX Take 2 mg by mouth at bedtime.   triamcinolone 55 MCG/ACT Aero nasal inhaler Commonly known as:  NASACORT  Place 2 sprays into the nose daily.       Allergies: No Known Allergies  Past Medical History:  Diagnosis Date  . ADHD (attention deficit hyperactivity disorder)   . Anemia   . Anxiety   . Bipolar disorder ([place])   . Carpal tunnel syndrome of right wrist 09/2014  . Cataract, immature   . Depression   . Diabetic neuropathy (HCC)    feet  . Dyslipidemia   . GERD (gastroesophageal reflux disease)   . Hypertension    states under control with med., has been on med. x 20 yrs.  . Insulin dependent diabetes mellitus ([place])   . Osteoarthritis    knees  . SLEEP APNEA    uses CPAP nightly  . Vitamin D deficiency     Past Surgical History:  Procedure Laterality Date  . BREAST REDUCTION SURGERY Bilateral   . CARPAL TUNNEL RELEASE Right 10/08/2014   Procedure: RIGHT CARPAL TUNNEL RELEASE;  Surgeon: Daryll Brod, MD;  Location: [facility];  Service: Orthopedics;  Laterality: Right;  . COLONOSCOPY WITH PROPOFOL  10/27/2013  . ESOPHAGOGASTRODUODENOSCOPY  02/03/2004  . LAPAROSCOPIC GASTRIC BANDING  12/05/2005    Family History  Problem Relation Age of Onset  . Heart disease Father   . Breast cancer Sister   . Diabetes Sister   . Colon cancer Neg Hx   . Dementia Neg Hx   . Sarcoidosis Brother   . Cancer Brother     prostate  . Heart failure Mother     Social History:  reports that she has never smoked. She has never used smokeless tobacco. She reports that she does not drink alcohol or use drugs.  Review of Systems:   HYPERTENSION:   This is mild and  treated with Micardis by PCP   No history of recent leg edema; she is on 40 mg Lasix   HYPERLIPIDEMIA: The lipid abnormality consists of elevated LDL.  Has been given Vytorin by PCP and LDL previously has been rather low, she has no history of CAD    Lab Results  Component Value Date   CHOL 116 05/03/2016   HDL 64.40 05/03/2016   LDLCALC 33 05/03/2016   TRIG 91.0 05/03/2016   CHOLHDL 2 05/03/2016      Has some pain in legs and feet from neuropathy and is taking large doses of gabapentin partly for depression; does have mild sensory loss on exam   Diabetic foot exam done in 6/17 Has mildly reduced monofilament sensation on the left side normal pedal pulses      Examination:   BP 118/68   Pulse 82   Wt 168 lb (76.2 kg)   SpO2 96%   BMI 28.84 kg/m   Body mass index is 28.84 kg/m.   Repeat blood pressure unchanged  No ankle edema   ASSESSMENT/ PLAN:   Diabetes type 2  See history of present illness for detailed discussion of current management, blood sugar patterns and problems identified  She has gained weight despite starting Victoza Also not improving her satiety levels with starting this She appears to have periodically high readings after supper as high as 336 and readings in the afternoons are also trending to be higher Previously had benefited from Victoza but not now No recent labs available to objectively assess her control  Recommendations:  Increase Invokana to 300 mg when she finishes her current supply  Switch Lantus to the morning to help control blood sugars during the day and  evening, they may be rising in the evening from inadequate duration of action  Increase suppertime Humalog by 2-4 units and lunchtime dose by 1-2 units at least  Consistent diet and may need to take extra 2-3 units for extra snacks or desserts  More consistent exercise  With increasing Invokana she'll reduce her Micardis to half tablet  HYPERTENSION: The blood pressure  is well controlled, probably low normal for her     Patient Instructions  Invokana 200mg  in am  Micardis 1/2 daily  Lantus 26 units in am not nite  Humalog 8-10 at supper daily  Humalog 7-8 unit at lunch  Stop Seibert 07/16/2016, 5:46 PM

## 2016-07-25 DIAGNOSIS — F331 Major depressive disorder, recurrent, moderate: Secondary | ICD-10-CM | POA: Diagnosis not present

## 2016-07-31 DIAGNOSIS — M50322 Other cervical disc degeneration at C5-C6 level: Secondary | ICD-10-CM | POA: Diagnosis not present

## 2016-07-31 DIAGNOSIS — M5412 Radiculopathy, cervical region: Secondary | ICD-10-CM | POA: Diagnosis not present

## 2016-07-31 DIAGNOSIS — M50321 Other cervical disc degeneration at C4-C5 level: Secondary | ICD-10-CM | POA: Diagnosis not present

## 2016-08-08 DIAGNOSIS — M5412 Radiculopathy, cervical region: Secondary | ICD-10-CM | POA: Diagnosis not present

## 2016-08-14 DIAGNOSIS — K080 Exfoliation of teeth due to systemic causes: Secondary | ICD-10-CM | POA: Diagnosis not present

## 2016-08-15 DIAGNOSIS — M5412 Radiculopathy, cervical region: Secondary | ICD-10-CM | POA: Diagnosis not present

## 2016-08-17 DIAGNOSIS — M5412 Radiculopathy, cervical region: Secondary | ICD-10-CM | POA: Diagnosis not present

## 2016-08-22 DIAGNOSIS — F331 Major depressive disorder, recurrent, moderate: Secondary | ICD-10-CM | POA: Diagnosis not present

## 2016-08-24 DIAGNOSIS — M5412 Radiculopathy, cervical region: Secondary | ICD-10-CM | POA: Diagnosis not present

## 2016-09-05 ENCOUNTER — Other Ambulatory Visit (INDEPENDENT_AMBULATORY_CARE_PROVIDER_SITE_OTHER): Payer: 59

## 2016-09-05 DIAGNOSIS — Z794 Long term (current) use of insulin: Secondary | ICD-10-CM

## 2016-09-05 DIAGNOSIS — E1165 Type 2 diabetes mellitus with hyperglycemia: Secondary | ICD-10-CM

## 2016-09-05 LAB — BASIC METABOLIC PANEL
BUN: 17 mg/dL (ref 6–23)
CALCIUM: 8.4 mg/dL (ref 8.4–10.5)
CO2: 24 mEq/L (ref 19–32)
Chloride: 100 mEq/L (ref 96–112)
Creatinine, Ser: 1.29 mg/dL — ABNORMAL HIGH (ref 0.40–1.20)
GFR: 54.43 mL/min — AB (ref >60.00)
GLUCOSE: 393 mg/dL — AB (ref 70–99)
Potassium: 3.9 mEq/L (ref 3.5–5.1)
SODIUM: 133 meq/L — AB (ref 135–145)

## 2016-09-05 LAB — HEMOGLOBIN A1C: Hgb A1c MFr Bld: 8.7 % — ABNORMAL HIGH (ref 4.6–6.5)

## 2016-09-08 ENCOUNTER — Ambulatory Visit (INDEPENDENT_AMBULATORY_CARE_PROVIDER_SITE_OTHER): Payer: 59 | Admitting: Endocrinology

## 2016-09-08 ENCOUNTER — Other Ambulatory Visit: Payer: Self-pay

## 2016-09-08 ENCOUNTER — Encounter: Payer: Self-pay | Admitting: Endocrinology

## 2016-09-08 VITALS — BP 108/60 | HR 76 | Ht 64.0 in | Wt 171.0 lb

## 2016-09-08 DIAGNOSIS — E1165 Type 2 diabetes mellitus with hyperglycemia: Secondary | ICD-10-CM | POA: Diagnosis not present

## 2016-09-08 DIAGNOSIS — I10 Essential (primary) hypertension: Secondary | ICD-10-CM | POA: Diagnosis not present

## 2016-09-08 DIAGNOSIS — Z794 Long term (current) use of insulin: Secondary | ICD-10-CM | POA: Diagnosis not present

## 2016-09-08 MED ORDER — GLUCOSE BLOOD VI STRP: ORAL_STRIP | 3 refills | Status: DC

## 2016-09-08 NOTE — Patient Instructions (Signed)
Check blood sugars on waking up  3x weekly  Also check blood sugars about 2 hours after a meal and do this after different meals by rotation  Recommended blood sugar levels on waking up is 90-130 and about 2 hours after meal is 130-160  Please bring your blood sugar monitor to each visit, thank you  

## 2016-09-08 NOTE — Progress Notes (Signed)
Patient ID: Amy [name], female   DOB: 1955/11/21, 62 y.o.   MRN: [id number]   Reason for Appointment:  Followup of diabetes  History of Present Illness   Diagnosis: Type 2 DIABETES MELITUS, date of diagnosis 1993   Prior history: Her diabetes has been treated for several years with basal bolus insulin and also metformin She usually has had A1c results near normal and is fairly compliant with her basal bolus regimen She continues to be on various atypical antipsychotic drugs also However her blood sugars were poorly controlled in 03/2013 Subsequently with a trial of Victoza she did not have much improvement in blood sugars and control of her increased appetite or weight gain Victoza was stopped in 2/15 when she was having excessive weight loss  Recent history:  Insulin regimen: Humalog breakfast 5-6 units, lunch 0-6; supper 8-10 . Lantus insulin 0-26 units hs        She is on basal bolus insulin regimen along with Invokana 300 mg and metformin  A1c is 8.7, previously 7.4   Victoza was stopped because of lack of perceived benefit and Invokana was increased up to 300 mg especially with her weight gain  Current management, blood sugar patterns and problems identified:  She did bring her monitor for download today but has only 2 readings in the last month, she says she ran out of test strips and did not ask for a refill.  Also not clear if the recent reading she has is with expired test strips  She appears to have had increased her doses of Humalog at home compared to last visit  After much discussion the patient admits that she has not been taking her insulin consistently including Lantus because of her depression being worse  She does not always eat lunch  Lab glucose was significantly high at 393 although she had juice before this  However Review of her monitor for the last 2-3 months indicates blood sugars ranging from 80-338 with occasional normal readings in the  morning  She does continue to take the Invokana  Has gained weight from inconsistent diet and not regularly exercising recently  Oral hypoglycemic drugs: Invokana 100 mg, metformin 1 g twice a day  Proper timing of medications in relation to meals: Yes.                  Monitors blood glucose:  less than once a day     Glucometer:  Accu-Chek           Blood Glucose readings: Has only 2 readings in the last month  Diet: Usually low fat; Breakfast: cheese crackers, yogurt at various times.  Does eat 2-3 meals a day, Lunch 1-2 pm And may be light, dinner 6 PM  Exercise: Some walking at work, recently not using her exercise bike at home    Wt Readings from Last 3 Encounters:  09/08/16 171 lb (77.6 kg)  07/14/16 168 lb (76.2 kg)  05/31/16 165 lb (74.8 kg)   Lab Results  Component Value Date   HGBA1C 8.7 (H) 09/05/2016   HGBA1C 7.4 (H) 05/03/2016   HGBA1C 7.0 (H) 01/31/2016   Lab Results  Component Value Date   MICROALBUR <0.7 01/31/2016   LDLCALC 33 05/03/2016   CREATININE 1.29 (H) 09/05/2016    Lab on 09/05/2016  Component Date Value Ref Range Status  . Hgb A1c MFr Bld 09/05/2016 8.7* 4.6 - 6.5 % Final  . Sodium 09/05/2016 133* 135 -  145 mEq/L Final  . Potassium 09/05/2016 3.9  3.5 - 5.1 mEq/L Final  . Chloride 09/05/2016 100  96 - 112 mEq/L Final  . CO2 09/05/2016 24  19 - 32 mEq/L Final  . Glucose, Bld 09/05/2016 393* 70 - 99 mg/dL Final  . BUN 09/05/2016 17  6 - 23 mg/dL Final  . Creatinine, Ser 09/05/2016 1.29* 0.40 - 1.20 mg/dL Final  . Calcium 09/05/2016 8.4  8.4 - 10.5 mg/dL Final  . GFR 09/05/2016 54.43* >60.00 mL/min Final    Allergies as of 09/08/2016   No Known Allergies     Medication List       Accurate as of 09/08/16 11:59 PM. Always use your most recent med list.          accu-chek soft touch lancets Use as instructed to check blood sugars daily dx 250.01   amphetamine-dextroamphetamine 30 MG 24 hr capsule Commonly known as:  ADDERALL  XR Take by mouth every morning.   ARIPiprazole 10 MG tablet Commonly known as:  ABILIFY Take 10 mg by mouth daily.   aspirin 81 MG tablet Take 81 mg by mouth daily.   canagliflozin 300 MG Tabs tablet Commonly known as:  INVOKANA Take 1 tablet (300 mg total) by mouth daily.   celecoxib 200 MG capsule Commonly known as:  CELEBREX Take 200 mg by mouth daily.   cetirizine 10 MG tablet Commonly known as:  ZYRTEC Take 10 mg by mouth daily as needed for allergies.   Clobetasol Propionate 0.05 % lotion   ergocalciferol 50000 units capsule Commonly known as:  VITAMIN D2 Take 50,000 Units by mouth once a week.   esomeprazole 40 MG capsule Commonly known as:  NEXIUM Take 40 mg by mouth daily before breakfast.   ezetimibe-simvastatin 10-80 MG tablet Commonly known as:  VYTORIN Take 1 tablet by mouth at bedtime.   FERREX 150 PO Take 150 mg by mouth daily.   Fluocinolone Acetonide Scalp 0.01 % Oil   furosemide 80 MG tablet Commonly known as:  LASIX Take 80 mg by mouth daily.   gabapentin 600 MG tablet Commonly known as:  NEURONTIN 2 tablets twice daily   glucose blood test strip Commonly known as:  ONETOUCH VERIO Use as instructed to check blood sugar once a day dx code E11.49   imipramine 50 MG tablet Commonly known as:  TOFRANIL Take 150 mg by mouth at bedtime.   Insulin Glargine 100 UNIT/ML Solostar Pen Commonly known as:  LANTUS SOLOSTAR Inject 26 Units into the skin at bedtime.   insulin lispro 100 UNIT/ML KiwkPen Commonly known as:  HUMALOG Inject 3 Units into the skin 3 (three) times daily.   Insulin Pen Needle 31G X 5 MM Misc Commonly known as:  B-D UF III MINI PEN NEEDLES Use 4 per day   KLOR-CON M20 20 MEQ tablet Generic drug:  potassium chloride SA Take 40 mEq by mouth daily.   liraglutide 18 MG/3ML Sopn Commonly known as:  VICTOZA Inject 0.2 mLs (1.2 mg total) into the skin daily.   metFORMIN 1000 MG tablet Commonly known as:   GLUCOPHAGE Take 1 tablet (1,000 mg total) by mouth 2 (two) times daily with a meal.   NUQUIN HP 4 % Crea   PRISTIQ 50 MG 24 hr tablet Generic drug:  desvenlafaxine   telmisartan 80 MG tablet Commonly known as:  MICARDIS Take 80 mg by mouth daily.   tiZANidine 2 MG tablet Commonly known as:  ZANAFLEX Take 2 mg by  mouth at bedtime.   triamcinolone 55 MCG/ACT Aero nasal inhaler Commonly known as:  NASACORT Place 2 sprays into the nose daily.       Allergies: No Known Allergies  Past Medical History:  Diagnosis Date  . ADHD (attention deficit hyperactivity disorder)   . Anemia   . Anxiety   . Bipolar disorder ([place])   . Carpal tunnel syndrome of right wrist 09/2014  . Cataract, immature   . Depression   . Diabetic neuropathy (HCC)    feet  . Dyslipidemia   . GERD (gastroesophageal reflux disease)   . Hypertension    states under control with med., has been on med. x 20 yrs.  . Insulin dependent diabetes mellitus ([place])   . Osteoarthritis    knees  . SLEEP APNEA    uses CPAP nightly  . Vitamin D deficiency     Past Surgical History:  Procedure Laterality Date  . BREAST REDUCTION SURGERY Bilateral   . CARPAL TUNNEL RELEASE Right 10/08/2014   Procedure: RIGHT CARPAL TUNNEL RELEASE;  Surgeon: Daryll Brod, MD;  Location: [facility];  Service: Orthopedics;  Laterality: Right;  . COLONOSCOPY WITH PROPOFOL  10/27/2013  . ESOPHAGOGASTRODUODENOSCOPY  02/03/2004  . LAPAROSCOPIC GASTRIC BANDING  12/05/2005    Family History  Problem Relation Age of Onset  . Heart disease Father   . Breast cancer Sister   . Diabetes Sister   . Colon cancer Neg Hx   . Dementia Neg Hx   . Sarcoidosis Brother   . Cancer Brother     prostate  . Heart failure Mother     Social History:  reports that she has never smoked. She has never used smokeless tobacco. She reports that she does not drink alcohol or use drugs.  Review of Systems:  The following is a copy of the  previous note:  HYPERTENSION:   This is mild and treated with Micardis by PCP   No history of recent leg edema; she is on 40 mg Lasix   HYPERLIPIDEMIA: The lipid abnormality consists of elevated LDL.  Has been given Vytorin by PCP and LDL previously has been rather low, she has no history of CAD    Lab Results  Component Value Date   CHOL 116 05/03/2016   HDL 64.40 05/03/2016   LDLCALC 33 05/03/2016   TRIG 91.0 05/03/2016   CHOLHDL 2 05/03/2016     Has some pain in legs and feet from neuropathy and is taking large doses of gabapentin partly for depression; does have mild sensory loss on exam   Diabetic foot exam done in 6/17 Has mildly reduced monofilament sensation on the left side normal pedal pulses      Examination:   BP 108/60   Pulse 76   Ht 5\' 4"  (1.626 m)   Wt 171 lb (77.6 kg)   SpO2 97%   BMI 29.35 kg/m   Body mass index is 29.35 kg/m.   Repeat blood pressure unchanged  No ankle edema   ASSESSMENT/ PLAN:   Diabetes type 2  See history of present illness for detailed discussion of current management, blood sugar patterns and problems identified  Her blood sugars are poorly controlled and this is from noncompliance with her insulin regimen, diet and glucose monitoring She has had more depression and is not focusing on her diabetes care Also has gained weight despite continuing Invokana Blood sugar has been as high as 393 in the lab, fasting readings may be higher  when she does not take her Lantus at night  Recommendations:  New prescription given for test strips  No change in insulin regimen at this time but tried to be better compliant with all her injections daily  For better compliance she can take her Lantus in the evening at suppertime instead of bedtime  Needs short-term follow-up to ensure compliance  She can discuss her depression with psychiatrist soon  Regular exercise  Discussed blood sugar monitoring at various times including after  meals and discussed blood sugar targets  Balanced meals and avoid excessive juice  HYPERTENSION: The blood pressure is well controlled, again low normal and followed by PCP     Patient Instructions  Check blood sugars on waking up  3x weekly  Also check blood sugars about 2 hours after a meal and do this after different meals by rotation  Recommended blood sugar levels on waking up is 90-130 and about 2 hours after meal is 130-160  Please bring your blood sugar monitor to each visit, thank you    Counseling time on subjects discussed above is over 50% of today's 25 minute visit   Amy [name] 09/10/2016, 3:27 PM

## 2016-09-20 ENCOUNTER — Telehealth: Payer: Self-pay | Admitting: Endocrinology

## 2016-09-20 ENCOUNTER — Other Ambulatory Visit: Payer: Self-pay

## 2016-09-20 MED ORDER — INSULIN GLARGINE 100 UNIT/ML SOLOSTAR PEN: 26.0000 [IU] | PEN_INJECTOR | Freq: Every day | SUBCUTANEOUS | 1 refills | Status: DC

## 2016-09-20 NOTE — Telephone Encounter (Signed)
Refill  Insulin Glargine (LANTUS SOLOSTAR) 100 UNIT/ML Solostar Pen  [place], [place] [phone] (Phone) [phone] (Fax)

## 2016-09-20 NOTE — Telephone Encounter (Signed)
Ordered 09/20/16

## 2016-09-22 ENCOUNTER — Telehealth: Payer: Self-pay | Admitting: Endocrinology

## 2016-09-22 NOTE — Telephone Encounter (Signed)
Her feet are swelling and having sharp pain in both of her feet feels it could be from the invokana  Shooting pain in toes

## 2016-09-22 NOTE — Telephone Encounter (Signed)
Not from [place], shooting pains are from her neuropathy.  She needs to adjust her fluid pills for swelling, she can call her PCP

## 2016-09-22 NOTE — Telephone Encounter (Signed)
patient is having pain in her feet ever since, her medication has been increased. B/s is 200    Patient primary care doctor stated she will not see patient for this concern because Dr Amy [name] is responsible for her problems for her feet, she only treat her for blood pressure.  Please advise

## 2016-09-22 NOTE — Telephone Encounter (Signed)
Pt is aware of the note she is going to contact the PCP

## 2016-09-25 NOTE — Telephone Encounter (Signed)
She needs to ask her psychiatrist if she can take Cymbalta which will help her pain. Also need to know exactly how much her blood sugars are by time of day and what insulin doses she is taking

## 2016-09-27 NOTE — Telephone Encounter (Signed)
Spoke to patient and she stated understanding

## 2016-10-02 ENCOUNTER — Other Ambulatory Visit: Payer: Self-pay

## 2016-10-02 MED ORDER — METFORMIN HCL 1000 MG PO TABS: 1000.0000 mg | ORAL_TABLET | Freq: Two times a day (BID) | ORAL | 3 refills | Status: DC

## 2016-10-05 ENCOUNTER — Other Ambulatory Visit: Payer: Self-pay | Admitting: Endocrinology

## 2016-10-16 ENCOUNTER — Other Ambulatory Visit: Payer: Self-pay | Admitting: Endocrinology

## 2016-10-16 ENCOUNTER — Telehealth: Payer: Self-pay | Admitting: Endocrinology

## 2016-10-16 DIAGNOSIS — E1165 Type 2 diabetes mellitus with hyperglycemia: Secondary | ICD-10-CM

## 2016-10-16 DIAGNOSIS — Z794 Long term (current) use of insulin: Secondary | ICD-10-CM

## 2016-10-16 NOTE — Telephone Encounter (Signed)
Please find out what other medication is covered in that category

## 2016-10-16 NOTE — Telephone Encounter (Signed)
Pt called in and said that the Victoza is requiring a PA and that she didn't know if Dr. Dwyane Dee would want to switch the medication or if he wanted to start a PA. Phone number for the PA is [phone] Please advise and call patient.

## 2016-10-17 ENCOUNTER — Other Ambulatory Visit (INDEPENDENT_AMBULATORY_CARE_PROVIDER_SITE_OTHER): Payer: 59

## 2016-10-17 DIAGNOSIS — Z794 Long term (current) use of insulin: Secondary | ICD-10-CM

## 2016-10-17 DIAGNOSIS — E1165 Type 2 diabetes mellitus with hyperglycemia: Secondary | ICD-10-CM

## 2016-10-17 LAB — BASIC METABOLIC PANEL
BUN: 8 mg/dL (ref 6–23)
CALCIUM: 9.3 mg/dL (ref 8.4–10.5)
CO2: 31 meq/L (ref 19–32)
CREATININE: 1.05 mg/dL (ref 0.40–1.20)
Chloride: 102 mEq/L (ref 96–112)
GFR: 69 mL/min (ref >60.00)
Glucose, Bld: 183 mg/dL — ABNORMAL HIGH (ref 70–99)
Potassium: 4.4 mEq/L (ref 3.5–5.1)
Sodium: 137 mEq/L (ref 135–145)

## 2016-10-18 LAB — FRUCTOSAMINE: Fructosamine: 299 umol/L — ABNORMAL HIGH (ref 0–285)

## 2016-10-19 NOTE — Telephone Encounter (Signed)
Insurance is stating that there is no alternate and that either a PA is needed or call in something different.

## 2016-10-19 NOTE — Telephone Encounter (Signed)
Do PA, has hthis been started?

## 2016-10-20 ENCOUNTER — Ambulatory Visit (INDEPENDENT_AMBULATORY_CARE_PROVIDER_SITE_OTHER): Payer: 59 | Admitting: Endocrinology

## 2016-10-20 ENCOUNTER — Other Ambulatory Visit: Payer: Self-pay

## 2016-10-20 ENCOUNTER — Encounter: Payer: Self-pay | Admitting: Endocrinology

## 2016-10-20 VITALS — BP 112/66 | HR 76 | Ht 65.0 in | Wt 164.0 lb

## 2016-10-20 DIAGNOSIS — Z794 Long term (current) use of insulin: Secondary | ICD-10-CM

## 2016-10-20 DIAGNOSIS — E1165 Type 2 diabetes mellitus with hyperglycemia: Secondary | ICD-10-CM | POA: Diagnosis not present

## 2016-10-20 DIAGNOSIS — M25473 Effusion, unspecified ankle: Secondary | ICD-10-CM

## 2016-10-20 MED ORDER — INSULIN PEN NEEDLE 31G X 5 MM MISC: 1 refills | Status: DC

## 2016-10-20 MED ORDER — DULAGLUTIDE 1.5 MG/0.5ML ~~LOC~~ SOAJ: SUBCUTANEOUS | 2 refills | Status: DC

## 2016-10-20 NOTE — Patient Instructions (Signed)
Take 8-10 Humalog at supper and keep sugars 2-3 hrs after supper in 140-160 range  2-3 More Humalog for hi fat  Check blood sugars on waking up  3x weekly  Also check blood sugars about 2 hours after a meal and do this after different meals by rotation  Recommended blood sugar levels on waking up is 90-130 and about 2 hours after meal is 130-160  Please bring your blood sugar monitor to each visit, thank you

## 2016-10-20 NOTE — Progress Notes (Signed)
Patient ID: Amy [name], female   DOB: 04-09-1956, 61 y.o.   MRN: [id number]   Reason for Appointment:  Followup of diabetes  History of Present Illness   Diagnosis: Type 2 DIABETES MELITUS, date of diagnosis 1993   Prior history: Her diabetes has been treated for several years with basal bolus insulin and also metformin She usually has had A1c results near normal and is fairly compliant with her basal bolus regimen She continues to be on various atypical antipsychotic drugs also However her blood sugars were poorly controlled in 03/2013 Subsequently with a trial of Victoza she did not have much improvement in blood sugars and control of her increased appetite or weight gain Victoza was stopped in 2/15 when she was having excessive weight loss  Recent history:  Insulin regimen: Humalog at breakfast and lunch 2 units, suppertime 6-8 units Lantus insulin 26 units hs        Non-insulin hypoglycemic drugs: Invokana 300 mg, metformin 1 g twice a day, Victoza 1.2 mg daily  A1c is 8.7 as of 8/18, previously 7.4   Current management, blood sugar patterns and problems identified:  She has started checking her blood sugars more consistently since her last visit when she was only taking sporadically.  Also she had been very forgetful in taking her medications including Lantus insulin causing significant hyperglycemia at times  She has gone back to taking Victoza even though she had not had consistent benefit previously  For this month her blood sugars have come down progressively  She is also eating somewhat smaller portions and appears to be needing much less mealtime insulin with starting Victoza  She says that she does not eat right after taking her Humalog she will feel hypoglycemic and shaky  No side effects from Victoza or Invokana  FASTING readings are higher over variable and trending higher recently  No readings AFTER evening meal recently but overall appeared to  be the highest of the day and only near normal couple of times  She appears to be gaining relatively larger doses of Humalog at suppertime but may not be adjusting based on meal size or higher fat intake at times  Still not exercising regularly  Proper timing of medications in relation to meals: Yes.                  Monitors blood glucose:  less than once a day     Glucometer:  One Touch Verio  Blood Glucose readings: Mean values apply above for all meters except median for One Touch  PRE-MEAL Fasting Lunch Dinner Bedtime Overall  Glucose range: 68-197    149-302    Mean/median: 110    199  142    POST-MEAL PC Breakfast PC Lunch PC Dinner  Glucose range:  104-204    Mean/median:  122       Diet: Usually low fat; Breakfast: cheese crackers, yogurt at various times.  Does eat 2-3 meals a day, Lunch 1-2 pm And may be light, dinner 6 PM  Exercise: Some walking at work,  not using her exercise bike at home    Wt Readings from Last 3 Encounters:  10/20/16 164 lb (74.4 kg)  09/08/16 171 lb (77.6 kg)  07/14/16 168 lb (76.2 kg)   Lab Results  Component Value Date   HGBA1C 8.7 (H) 09/05/2016   HGBA1C 7.4 (H) 05/03/2016   HGBA1C 7.0 (H) 01/31/2016   Lab Results  Component Value Date  MICROALBUR <0.7 01/31/2016   LDLCALC 33 05/03/2016   CREATININE 1.05 10/17/2016    Lab on 10/17/2016  Component Date Value Ref Range Status  . Sodium 10/17/2016 137  135 - 145 mEq/L Final  . Potassium 10/17/2016 4.4  3.5 - 5.1 mEq/L Final  . Chloride 10/17/2016 102  96 - 112 mEq/L Final  . CO2 10/17/2016 31  19 - 32 mEq/L Final  . Glucose, Bld 10/17/2016 183* 70 - 99 mg/dL Final  . BUN 10/17/2016 8  6 - 23 mg/dL Final  . Creatinine, Ser 10/17/2016 1.05  0.40 - 1.20 mg/dL Final  . Calcium 10/17/2016 9.3  8.4 - 10.5 mg/dL Final  . GFR 10/17/2016 69.00  >60.00 mL/min Final  . Fructosamine 10/17/2016 299* 0 - 285 umol/L Final   Comment: Published reference interval for apparently healthy  subjects between age 62 and 10 is 62 - 285 umol/L and in a poorly controlled diabetic population is 228 - 563 umol/L with a mean of 396 umol/L.     Allergies as of 10/20/2016   No Known Allergies     Medication List       Accurate as of 10/20/16 11:59 PM. Always use your most recent med list.          accu-chek soft touch lancets Use as instructed to check blood sugars daily dx 250.01   amphetamine-dextroamphetamine 30 MG 24 hr capsule Commonly known as:  ADDERALL XR Take by mouth every morning.   ARIPiprazole 10 MG tablet Commonly known as:  ABILIFY Take 10 mg by mouth daily.   aspirin 81 MG tablet Take 81 mg by mouth daily.   canagliflozin 300 MG Tabs tablet Commonly known as:  INVOKANA Take 1 tablet (300 mg total) by mouth daily.   celecoxib 200 MG capsule Commonly known as:  CELEBREX Take 200 mg by mouth daily.   cetirizine 10 MG tablet Commonly known as:  ZYRTEC Take 10 mg by mouth daily as needed for allergies.   Clobetasol Propionate 0.05 % lotion   Dulaglutide 1.5 MG/0.5ML Sopn Commonly known as:  TRULICITY Inject 1.5 mg weekly   ergocalciferol 50000 units capsule Commonly known as:  VITAMIN D2 Take 50,000 Units by mouth once a week.   esomeprazole 40 MG capsule Commonly known as:  NEXIUM Take 40 mg by mouth daily before breakfast.   ezetimibe-simvastatin 10-80 MG tablet Commonly known as:  VYTORIN Take 1 tablet by mouth at bedtime.   FERREX 150 PO Take 150 mg by mouth daily.   Fluocinolone Acetonide Scalp 0.01 % Oil   furosemide 80 MG tablet Commonly known as:  LASIX Take 80 mg by mouth daily.   gabapentin 600 MG tablet Commonly known as:  NEURONTIN 2 tablets twice daily   glucose blood test strip Commonly known as:  ONETOUCH VERIO Use as instructed to check blood sugar once a day dx code E11.49   imipramine 50 MG tablet Commonly known as:  TOFRANIL Take 150 mg by mouth at bedtime.   Insulin Glargine 100 UNIT/ML Solostar  Pen Commonly known as:  LANTUS SOLOSTAR Inject 26 Units into the skin at bedtime.   insulin lispro 100 UNIT/ML KiwkPen Commonly known as:  HUMALOG Inject 3 Units into the skin 3 (three) times daily.   Insulin Pen Needle 31G X 5 MM Misc Commonly known as:  B-D UF III MINI PEN NEEDLES Use 4 per day   KLOR-CON M20 20 MEQ tablet Generic drug:  potassium chloride SA Take 40 mEq by  mouth daily.   LATUDA 60 MG Tabs Generic drug:  Lurasidone HCl   liraglutide 18 MG/3ML Sopn Commonly known as:  VICTOZA Inject 0.2 mLs (1.2 mg total) into the skin daily.   lithium carbonate 450 MG CR tablet Commonly known as:  ESKALITH   metFORMIN 1000 MG tablet Commonly known as:  GLUCOPHAGE TAKE 1 TABLET TWICE A DAY WITH A MEAL   NUQUIN HP 4 % Crea   PRISTIQ 50 MG 24 hr tablet Generic drug:  desvenlafaxine   telmisartan 80 MG tablet Commonly known as:  MICARDIS Take 80 mg by mouth daily.   tiZANidine 2 MG tablet Commonly known as:  ZANAFLEX Take 2 mg by mouth at bedtime.   triamcinolone 55 MCG/ACT Aero nasal inhaler Commonly known as:  NASACORT Place 2 sprays into the nose daily.       Allergies: No Known Allergies  Past Medical History:  Diagnosis Date  . ADHD (attention deficit hyperactivity disorder)   . Anemia   . Anxiety   . Bipolar disorder ([place])   . Carpal tunnel syndrome of right wrist 09/2014  . Cataract, immature   . Depression   . Diabetic neuropathy (HCC)    feet  . Dyslipidemia   . GERD (gastroesophageal reflux disease)   . Hypertension    states under control with med., has been on med. x 20 yrs.  . Insulin dependent diabetes mellitus ([place])   . Osteoarthritis    knees  . SLEEP APNEA    uses CPAP nightly  . Vitamin D deficiency     Past Surgical History:  Procedure Laterality Date  . BREAST REDUCTION SURGERY Bilateral   . CARPAL TUNNEL RELEASE Right 10/08/2014   Procedure: RIGHT CARPAL TUNNEL RELEASE;  Surgeon: Daryll Brod, MD;  Location: [facility];  Service: Orthopedics;  Laterality: Right;  . COLONOSCOPY WITH PROPOFOL  10/27/2013  . ESOPHAGOGASTRODUODENOSCOPY  02/03/2004  . LAPAROSCOPIC GASTRIC BANDING  12/05/2005    Family History  Problem Relation Age of Onset  . Heart disease Father   . Breast cancer Sister   . Diabetes Sister   . Colon cancer Neg Hx   . Dementia Neg Hx   . Sarcoidosis Brother   . Cancer Brother     prostate  . Heart failure Mother     Social History:  reports that she has never smoked. She has never used smokeless tobacco. She reports that she does not drink alcohol or use drugs.  Review of Systems:   HYPERTENSION:   This is mild and treated with Micardis by PCP   No history of recent leg edema; she is on ? 40 mg Lasix   HYPERLIPIDEMIA: The lipid abnormality consists of elevated LDL.  Has been given Vytorin by PCP and LDL previously has been rather low, she has no history of CAD   Lab Results  Component Value Date   CHOL 116 05/03/2016   HDL 64.40 05/03/2016   LDLCALC 33 05/03/2016   TRIG 91.0 05/03/2016   CHOLHDL 2 05/03/2016     Has some pain in legs and feet from neuropathy and is taking1200 mg twice a day of gabapentin, prescribed for her depression otherwise  Diabetic foot exam done in 6/17 Has mildly reduced monofilament sensation on the left side normal pedal pulses  She apparently not taking lithium for her depression     Examination:   BP 112/66   Pulse 76   Ht 5\' 5"  (1.651 m)   Wt 164  lb (74.4 kg)   SpO2 98%   BMI 27.29 kg/m   Body mass index is 27.29 kg/m.       ASSESSMENT/ PLAN:   Diabetes type 2  See history of present illness for detailed discussion of current management, blood sugar patterns and problems identified  Her blood sugars are Much better controlled with her taking control of her depression and focusing back on taking her insulin as directed She is now able to take her mealtime insulin with every meal and Lantus consistently Also  previously had been advised to avoid excessive high sugar intake in drinks However some of the variability in her blood sugars is related to variability in her diet especially at suppertime with sometimes higher carbohydrate or high-fat intake for which she is not adjusting her insulin enough Also recently has minimal monitoring after supper Appears to be benefiting from [place] with requiring very little insulin to cover her breakfast and lunch now Further is starting to lose some weight also She does need to exercise   Recommendations:  Apparently she now needs prior authorization from her insurance for Victoza, for now will try her on Trulicity 1.5 mg weekly  Discussed needing to check more readings after supper and rotate the times of her monitoring at breakfast and in the afternoon  Discussed blood sugar targets  She will probably need 8-10 units of Humalog at suppertime and 2-3 units more for higher fat or higher carbohydrate intake  Regular exercise  No change in Lantus as yet  May also consider using the V-go pump for easier management  Continue Invokana  A1c on the next visit  HYPERTENSION: The blood pressure is well controlled  DIABETIC neuropathy: Symptoms are controlled   EDEMA: This is not present now and she needs to confirm that she is taking only 40 mg Lasix  Patient Instructions  Take 8-10 Humalog at supper and keep sugars 2-3 hrs after supper in 140-160 range  2-3 More Humalog for hi fat  Check blood sugars on waking up  3x weekly  Also check blood sugars about 2 hours after a meal and do this after different meals by rotation  Recommended blood sugar levels on waking up is 90-130 and about 2 hours after meal is 130-160  Please bring your blood sugar monitor to each visit, thank you    Counseling time on subjects discussed above is over 50% of today's 25 minute visit   Jentri Aye 10/22/2016, 8:51 AM

## 2016-10-22 ENCOUNTER — Other Ambulatory Visit: Payer: Self-pay | Admitting: Endocrinology

## 2016-11-20 ENCOUNTER — Other Ambulatory Visit: Payer: Self-pay

## 2016-11-20 ENCOUNTER — Telehealth: Payer: Self-pay | Admitting: Endocrinology

## 2016-11-20 MED ORDER — INSULIN GLARGINE 100 UNIT/ML SOLOSTAR PEN: 26.0000 [IU] | PEN_INJECTOR | Freq: Every day | SUBCUTANEOUS | 1 refills | Status: DC

## 2016-11-20 NOTE — Telephone Encounter (Signed)
Ordered

## 2016-11-20 NOTE — Telephone Encounter (Signed)
Refill  Insulin Glargine (LANTUS SOLOSTAR) 100 UNIT/ML Solostar Pen 15 pen   [facility] - [place], [place] [phone] (Phone) [phone] (Fax)

## 2016-12-06 ENCOUNTER — Other Ambulatory Visit: Payer: Self-pay

## 2016-12-06 MED ORDER — DULAGLUTIDE 1.5 MG/0.5ML ~~LOC~~ SOAJ: SUBCUTANEOUS | 2 refills | Status: DC

## 2016-12-06 NOTE — Telephone Encounter (Signed)
This has been ordered again- 12/06/16

## 2016-12-06 NOTE — Telephone Encounter (Signed)
Pt needs [place] to resend the tr[facility]icity [facility] to [facility] according to them they have not received the rx.

## 2016-12-15 ENCOUNTER — Other Ambulatory Visit: Payer: 59

## 2016-12-19 ENCOUNTER — Ambulatory Visit: Payer: 59 | Admitting: Endocrinology

## 2016-12-25 ENCOUNTER — Telehealth: Payer: Self-pay | Admitting: Endocrinology

## 2016-12-25 ENCOUNTER — Other Ambulatory Visit: Payer: Self-pay

## 2016-12-25 MED ORDER — CANAGLIFLOZIN 300 MG PO TABS: 300.0000 mg | ORAL_TABLET | Freq: Every day | ORAL | 2 refills | Status: DC

## 2016-12-25 NOTE — Telephone Encounter (Signed)
Pt needs refill on invokana called to [facility]

## 2016-12-25 NOTE — Telephone Encounter (Signed)
Refill submitted. 

## 2017-01-10 ENCOUNTER — Emergency Department (HOSPITAL_COMMUNITY): Payer: No Typology Code available for payment source

## 2017-01-10 ENCOUNTER — Emergency Department (HOSPITAL_COMMUNITY)
Admission: EM | Admit: 2017-01-10 | Discharge: 2017-01-10 | Disposition: A | Discharge status: Home / Self Care | Payer: No Typology Code available for payment source | Attending: Emergency Medicine | Admitting: Emergency Medicine

## 2017-01-10 ENCOUNTER — Encounter (HOSPITAL_COMMUNITY): Payer: Self-pay

## 2017-01-10 DIAGNOSIS — Y939 Activity, unspecified: Secondary | ICD-10-CM | POA: Diagnosis not present

## 2017-01-10 DIAGNOSIS — Z79899 Other long term (current) drug therapy: Secondary | ICD-10-CM | POA: Diagnosis not present

## 2017-01-10 DIAGNOSIS — S6991XA Unspecified injury of right wrist, hand and finger(s), initial encounter: Secondary | ICD-10-CM | POA: Diagnosis present

## 2017-01-10 DIAGNOSIS — R079 Chest pain, unspecified: Secondary | ICD-10-CM | POA: Diagnosis not present

## 2017-01-10 DIAGNOSIS — S70311A Abrasion, right thigh, initial encounter: Secondary | ICD-10-CM | POA: Diagnosis not present

## 2017-01-10 DIAGNOSIS — R51 Headache: Secondary | ICD-10-CM | POA: Diagnosis not present

## 2017-01-10 DIAGNOSIS — F909 Attention-deficit hyperactivity disorder, unspecified type: Secondary | ICD-10-CM | POA: Diagnosis not present

## 2017-01-10 DIAGNOSIS — R935 Abnormal findings on diagnostic imaging of other abdominal regions, including retroperitoneum: Secondary | ICD-10-CM | POA: Insufficient documentation

## 2017-01-10 DIAGNOSIS — I951 Orthostatic hypotension: Secondary | ICD-10-CM

## 2017-01-10 DIAGNOSIS — E1140 Type 2 diabetes mellitus with diabetic neuropathy, unspecified: Secondary | ICD-10-CM | POA: Diagnosis not present

## 2017-01-10 DIAGNOSIS — S60511A Abrasion of right hand, initial encounter: Secondary | ICD-10-CM | POA: Insufficient documentation

## 2017-01-10 DIAGNOSIS — I10 Essential (primary) hypertension: Secondary | ICD-10-CM | POA: Diagnosis not present

## 2017-01-10 DIAGNOSIS — Z7982 Long term (current) use of aspirin: Secondary | ICD-10-CM | POA: Insufficient documentation

## 2017-01-10 DIAGNOSIS — Y92410 Unspecified street and highway as the place of occurrence of the external cause: Secondary | ICD-10-CM | POA: Insufficient documentation

## 2017-01-10 DIAGNOSIS — Y999 Unspecified external cause status: Secondary | ICD-10-CM | POA: Diagnosis not present

## 2017-01-10 DIAGNOSIS — Z8673 Personal history of transient ischemic attack (TIA), and cerebral infarction without residual deficits: Secondary | ICD-10-CM | POA: Diagnosis not present

## 2017-01-10 DIAGNOSIS — R55 Syncope and collapse: Secondary | ICD-10-CM

## 2017-01-10 LAB — COMPREHENSIVE METABOLIC PANEL
ALK PHOS: 75 U/L (ref 38–126)
ALT: 22 U/L (ref 14–54)
ANION GAP: 6 (ref 5–15)
AST: 18 U/L (ref 15–41)
Albumin: 3.4 g/dL — ABNORMAL LOW (ref 3.5–5.0)
BUN: 10 mg/dL (ref 6–20)
CALCIUM: 8.4 mg/dL — AB (ref 8.9–10.3)
CO2: 25 mmol/L (ref 22–32)
CREATININE: 1.04 mg/dL — AB (ref 0.44–1.00)
Chloride: 106 mmol/L (ref 101–111)
GFR, EST NON AFRICAN AMERICAN: 58 mL/min — AB (ref >60)
Glucose, Bld: 95 mg/dL (ref 65–99)
Potassium: 3.4 mmol/L — ABNORMAL LOW (ref 3.5–5.1)
SODIUM: 137 mmol/L (ref 135–145)
Total Bilirubin: 0.6 mg/dL (ref 0.3–1.2)
Total Protein: 5.6 g/dL — ABNORMAL LOW (ref 6.5–8.1)

## 2017-01-10 LAB — I-STAT CHEM 8, ED
BUN: 10 mg/dL (ref 6–20)
CALCIUM ION: 1.11 mmol/L — AB (ref 1.15–1.40)
CHLORIDE: 101 mmol/L (ref 101–111)
CREATININE: 1 mg/dL (ref 0.44–1.00)
GLUCOSE: 91 mg/dL (ref 65–99)
HCT: 38 % (ref 36.0–46.0)
HEMOGLOBIN: 12.9 g/dL (ref 12.0–15.0)
POTASSIUM: 3.4 mmol/L — AB (ref 3.5–5.1)
Sodium: 141 mmol/L (ref 135–145)
TCO2: 26 mmol/L (ref 0–100)

## 2017-01-10 LAB — URINALYSIS, ROUTINE W REFLEX MICROSCOPIC
BILIRUBIN URINE: NEGATIVE
Bacteria, UA: NONE SEEN
HGB URINE DIPSTICK: NEGATIVE
KETONES UR: NEGATIVE mg/dL
LEUKOCYTES UA: NEGATIVE
NITRITE: NEGATIVE
PH: 7 (ref 5.0–8.0)
Protein, ur: NEGATIVE mg/dL
Specific Gravity, Urine: 1.014 (ref 1.005–1.030)

## 2017-01-10 LAB — CBC
HCT: 39.4 % (ref 36.0–46.0)
HEMOGLOBIN: 12 g/dL (ref 12.0–15.0)
MCH: 28.8 pg (ref 26.0–34.0)
MCHC: 30.5 g/dL (ref 30.0–36.0)
MCV: 94.5 fL (ref 78.0–100.0)
PLATELETS: 193 10*3/uL (ref 150–400)
RBC: 4.17 MIL/uL (ref 3.87–5.11)
RDW: 13.6 % (ref 11.5–15.5)
WBC: 5 10*3/uL (ref 4.0–10.5)

## 2017-01-10 LAB — CBG MONITORING, ED: GLUCOSE-CAPILLARY: 89 mg/dL (ref 65–99)

## 2017-01-10 LAB — PROTIME-INR
INR: 1.1
Prothrombin Time: 14.2 seconds (ref 11.4–15.2)

## 2017-01-10 LAB — I-STAT TROPONIN, ED: Troponin i, poc: 0 ng/mL (ref 0.00–0.08)

## 2017-01-10 LAB — SAMPLE TO BLOOD BANK

## 2017-01-10 LAB — CDS SEROLOGY

## 2017-01-10 LAB — ETHANOL

## 2017-01-10 LAB — I-STAT CG4 LACTIC ACID, ED: Lactic Acid, Venous: 1.28 mmol/L (ref 0.5–1.9)

## 2017-01-10 MED ORDER — SODIUM CHLORIDE 0.9 % IV BOLUS (SEPSIS)
1000.0000 mL | Freq: Once | INTRAVENOUS | Status: AC
  Administered 2017-01-10: 1000 mL via INTRAVENOUS

## 2017-01-10 MED ORDER — IOPAMIDOL (ISOVUE-300) INJECTION 61%
INTRAVENOUS | Status: AC
  Filled 2017-01-10: qty 100

## 2017-01-10 MED ORDER — CYCLOBENZAPRINE HCL 10 MG PO TABS
5.0000 mg | ORAL_TABLET | Freq: Once | ORAL | Status: AC
  Administered 2017-01-10: 5 mg via ORAL
  Filled 2017-01-10: qty 1

## 2017-01-10 MED ORDER — SODIUM CHLORIDE 0.9 % IV BOLUS (SEPSIS)
1000.0000 mL | Freq: Once | INTRAVENOUS | Status: AC
Start: 2017-01-10 — End: 2017-01-10
  Administered 2017-01-10: 1000 mL via INTRAVENOUS

## 2017-01-10 MED ORDER — CYCLOBENZAPRINE HCL 5 MG PO TABS: 5.0000 mg | ORAL_TABLET | Freq: Three times a day (TID) | ORAL | 0 refills | Status: DC | PRN

## 2017-01-10 MED ORDER — IBUPROFEN 800 MG PO TABS
800.0000 mg | ORAL_TABLET | Freq: Once | ORAL | Status: AC
  Administered 2017-01-10: 800 mg via ORAL
  Filled 2017-01-10: qty 1

## 2017-01-10 NOTE — ED Notes (Signed)
Pt and family state they understands instructions. Home stable with family. 
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
## 2017-01-10 NOTE — ED Notes (Signed)
Pt ambulated in hallway to restroom, no issues. Pt given orange juice per Chelsea(RN)
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
## 2017-01-10 NOTE — ED Notes (Signed)
C-collar placed by dr. Doree Fudge with manual c-spine maintained.

## 2017-01-10 NOTE — ED Notes (Addendum)
At discharge pt co pain at chest and head. Dr. Doree Fudge to room wirh new orders resulting but continued dc.
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
## 2017-01-10 NOTE — Discharge Instructions (Addendum)
Stop taking all your blood pressure meds.   Take tylenol, motrin for pain.   Take flexeril for muscle spasms.   See your doctor in a week to recheck your blood pressure.   Return to ER if you pass out, chest pain, abdominal pain, shortness of breath.
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
## 2017-01-10 NOTE — ED Notes (Signed)
Family at bedside. 

## 2017-01-10 NOTE — ED Notes (Signed)
Patient transported to CT with monitor. 
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
## 2017-07-02 ENCOUNTER — Ambulatory Visit: Payer: 59 | Admitting: Endocrinology

## 2017-10-01 DIAGNOSIS — F332 Major depressive disorder, recurrent severe without psychotic features: Secondary | ICD-10-CM | POA: Diagnosis not present

## 2017-10-09 DIAGNOSIS — Z1231 Encounter for screening mammogram for malignant neoplasm of breast: Secondary | ICD-10-CM | POA: Diagnosis not present

## 2017-10-09 DIAGNOSIS — Z01419 Encounter for gynecological examination (general) (routine) without abnormal findings: Secondary | ICD-10-CM | POA: Diagnosis not present

## 2017-10-09 DIAGNOSIS — Z1389 Encounter for screening for other disorder: Secondary | ICD-10-CM | POA: Diagnosis not present

## 2017-10-09 DIAGNOSIS — Z130 Encounter for screening for diseases of the blood and blood-forming organs and certain disorders involving the immune mechanism: Secondary | ICD-10-CM | POA: Diagnosis not present

## 2017-10-09 DIAGNOSIS — Z6823 Body mass index (BMI) 23.0-23.9, adult: Secondary | ICD-10-CM | POA: Diagnosis not present

## 2017-10-09 DIAGNOSIS — Z1151 Encounter for screening for human papillomavirus (HPV): Secondary | ICD-10-CM | POA: Diagnosis not present

## 2017-10-09 DIAGNOSIS — Z124 Encounter for screening for malignant neoplasm of cervix: Secondary | ICD-10-CM | POA: Diagnosis not present

## 2017-10-10 DIAGNOSIS — Z124 Encounter for screening for malignant neoplasm of cervix: Secondary | ICD-10-CM | POA: Diagnosis not present

## 2017-10-10 DIAGNOSIS — R87610 Atypical squamous cells of undetermined significance on cytologic smear of cervix (ASC-US): Secondary | ICD-10-CM | POA: Diagnosis not present

## 2017-10-15 DIAGNOSIS — E7800 Pure hypercholesterolemia, unspecified: Secondary | ICD-10-CM | POA: Diagnosis not present

## 2017-10-15 DIAGNOSIS — E1165 Type 2 diabetes mellitus with hyperglycemia: Secondary | ICD-10-CM | POA: Diagnosis not present

## 2017-10-18 ENCOUNTER — Other Ambulatory Visit: Payer: Self-pay | Admitting: Endocrinology

## 2017-10-22 DIAGNOSIS — E1165 Type 2 diabetes mellitus with hyperglycemia: Secondary | ICD-10-CM | POA: Diagnosis not present

## 2017-10-22 DIAGNOSIS — I10 Essential (primary) hypertension: Secondary | ICD-10-CM | POA: Diagnosis not present

## 2017-10-22 DIAGNOSIS — G5602 Carpal tunnel syndrome, left upper limb: Secondary | ICD-10-CM | POA: Diagnosis not present

## 2017-10-22 DIAGNOSIS — E785 Hyperlipidemia, unspecified: Secondary | ICD-10-CM | POA: Diagnosis not present

## 2017-10-22 DIAGNOSIS — E1140 Type 2 diabetes mellitus with diabetic neuropathy, unspecified: Secondary | ICD-10-CM | POA: Diagnosis not present

## 2017-10-22 DIAGNOSIS — E7800 Pure hypercholesterolemia, unspecified: Secondary | ICD-10-CM | POA: Diagnosis not present

## 2017-10-31 DIAGNOSIS — F331 Major depressive disorder, recurrent, moderate: Secondary | ICD-10-CM | POA: Diagnosis not present

## 2017-10-31 DIAGNOSIS — F341 Dysthymic disorder: Secondary | ICD-10-CM | POA: Diagnosis not present

## 2017-11-12 ENCOUNTER — Encounter: Payer: Federal, State, Local not specified - PPO | Admitting: Podiatry

## 2017-11-23 ENCOUNTER — Ambulatory Visit (INDEPENDENT_AMBULATORY_CARE_PROVIDER_SITE_OTHER): Payer: Federal, State, Local not specified - PPO

## 2017-11-23 ENCOUNTER — Ambulatory Visit: Payer: Federal, State, Local not specified - PPO | Admitting: Podiatry

## 2017-11-23 ENCOUNTER — Encounter: Payer: Self-pay | Admitting: Podiatry

## 2017-11-23 VITALS — BP 124/76 | HR 78 | Resp 16

## 2017-11-23 DIAGNOSIS — M2042 Other hammer toe(s) (acquired), left foot: Secondary | ICD-10-CM

## 2017-11-23 DIAGNOSIS — M2041 Other hammer toe(s) (acquired), right foot: Secondary | ICD-10-CM

## 2017-11-23 DIAGNOSIS — L600 Ingrowing nail: Secondary | ICD-10-CM

## 2017-11-23 DIAGNOSIS — N950 Postmenopausal bleeding: Secondary | ICD-10-CM | POA: Insufficient documentation

## 2017-11-23 DIAGNOSIS — E119 Type 2 diabetes mellitus without complications: Secondary | ICD-10-CM | POA: Insufficient documentation

## 2017-11-23 NOTE — Patient Instructions (Addendum)

## 2017-11-23 NOTE — Progress Notes (Signed)
Subjective:   Patient ID: Amy [name], female   DOB: 62 y.o.   MRN: [id number]   HPI Patient presents with digital deformities of the fourth and fifth toes bilateral and presents with a fourth toes bilateral keratotic lesions.  On the big toes bilateral there is keratotic distal tissue and there is also incurvation of the nailbeds on the medial side that are very painful.  Patient complains of the pain mostly in the big toe stating the other toes also bother her and states that her sugar is under excellent control with her last A1c being 6.4   Review of Systems  All other systems reviewed and are negative.       Objective:  Physical Exam  Constitutional: She appears well-developed and well-nourished.  Cardiovascular: Intact distal pulses.  Pulmonary/Chest: Effort normal.  Musculoskeletal: Normal range of motion.  Neurological: She is alert.  Skin: Skin is warm.  Nursing note and vitals reviewed.   Neurovascular status found to be intact muscle strength was adequate range of motion within normal limits with patient found to have significant rotation of the fifth digit bilateral and lifting the fourth toes bilateral with keratotic tissue formation.  Patient is noted to have incurvated nail borders hallux bilateral medial border with thickened tissue and also distal keratotic lesions secondary to the structure of the big toes.  The nailbeds themselves are very tender with no drainage or redness noted.  Patient has good digital perfusion and is well oriented x3     Assessment:  Ingrown toenail deformities hallux bilateral with hammertoe deformity fourth and fifth digits bilateral and distal keratotic tissue bilateral hallux     Plan:  H&P conditions reviewed conditions discussed at great length.  I do think correction of the ingrown toenails is in her best interest and I explained procedure risk and patient wants surgery understanding risk.  Patient read signed consent form after review  and at this time I infiltrated each hallux 60 mg like Marcaine mixture did sterile prep of the hallux and using sterile instrumentation remove the medial border bilateral exposed matrix and applied phenol 3 applications 30 seconds followed by alcohol lavage and sterile dressing.  I then applied padding to the adjacent digits discussed arthroplasty procedure which will probably be done at one point in future  X-rays indicate that there is rotation of the fifth digit bilateral with moderate lateral rotation of the hallux and elongated hallux bilateral with elevated fourth digit bilateral

## 2017-12-18 DIAGNOSIS — F332 Major depressive disorder, recurrent severe without psychotic features: Secondary | ICD-10-CM | POA: Diagnosis not present

## 2017-12-26 ENCOUNTER — Ambulatory Visit: Payer: Federal, State, Local not specified - PPO | Admitting: Podiatry

## 2017-12-26 ENCOUNTER — Encounter: Payer: Self-pay | Admitting: Podiatry

## 2017-12-26 DIAGNOSIS — M2042 Other hammer toe(s) (acquired), left foot: Secondary | ICD-10-CM | POA: Diagnosis not present

## 2017-12-26 DIAGNOSIS — M2041 Other hammer toe(s) (acquired), right foot: Secondary | ICD-10-CM | POA: Diagnosis not present

## 2017-12-26 DIAGNOSIS — L600 Ingrowing nail: Secondary | ICD-10-CM

## 2017-12-26 NOTE — Progress Notes (Signed)
Subjective:   Patient ID: Amy [name], female   DOB: 62 y.o.   MRN: [id number]   HPI Patient presents stating that there is crusted tissue that is bothersome around the ingrown's and also still has questions concerning digital deformity   ROS      Objective:  Physical Exam  Neurovascular status intact with patient noted to have crusted tissue around the hallux bilateral and is noted to have rotated fifth digits bilateral with keratotic lesions     Assessment:  Hammertoe deformity and crusted nail formation with scar tissue formation     Plan:  Recommended that the patient utilize a toothbrush to keep the tissue down and I may need to clean it out over time.  Also discussed digital correction which may be necessary in future

## 2018-01-10 NOTE — Progress Notes (Signed)
This encounter was created in error - please disregard.

## 2018-01-23 DIAGNOSIS — I10 Essential (primary) hypertension: Secondary | ICD-10-CM | POA: Diagnosis not present

## 2018-01-23 DIAGNOSIS — E785 Hyperlipidemia, unspecified: Secondary | ICD-10-CM | POA: Diagnosis not present

## 2018-01-23 DIAGNOSIS — R3915 Urgency of urination: Secondary | ICD-10-CM | POA: Diagnosis not present

## 2018-01-23 DIAGNOSIS — E1140 Type 2 diabetes mellitus with diabetic neuropathy, unspecified: Secondary | ICD-10-CM | POA: Diagnosis not present

## 2018-01-29 DIAGNOSIS — F332 Major depressive disorder, recurrent severe without psychotic features: Secondary | ICD-10-CM | POA: Diagnosis not present

## 2018-02-07 DIAGNOSIS — K080 Exfoliation of teeth due to systemic causes: Secondary | ICD-10-CM | POA: Diagnosis not present

## 2018-03-05 DIAGNOSIS — R229 Localized swelling, mass and lump, unspecified: Secondary | ICD-10-CM | POA: Diagnosis not present

## 2018-03-15 DIAGNOSIS — F332 Major depressive disorder, recurrent severe without psychotic features: Secondary | ICD-10-CM | POA: Diagnosis not present

## 2018-04-03 DIAGNOSIS — M795 Residual foreign body in soft tissue: Secondary | ICD-10-CM | POA: Diagnosis not present

## 2018-04-03 DIAGNOSIS — L818 Other specified disorders of pigmentation: Secondary | ICD-10-CM | POA: Diagnosis not present

## 2018-04-11 DIAGNOSIS — F332 Major depressive disorder, recurrent severe without psychotic features: Secondary | ICD-10-CM | POA: Diagnosis not present

## 2018-04-11 DIAGNOSIS — F341 Dysthymic disorder: Secondary | ICD-10-CM | POA: Diagnosis not present

## 2018-04-18 DIAGNOSIS — E1165 Type 2 diabetes mellitus with hyperglycemia: Secondary | ICD-10-CM | POA: Diagnosis not present

## 2018-04-18 DIAGNOSIS — E7800 Pure hypercholesterolemia, unspecified: Secondary | ICD-10-CM | POA: Diagnosis not present

## 2018-04-22 DIAGNOSIS — E1165 Type 2 diabetes mellitus with hyperglycemia: Secondary | ICD-10-CM | POA: Diagnosis not present

## 2018-04-22 DIAGNOSIS — E7800 Pure hypercholesterolemia, unspecified: Secondary | ICD-10-CM | POA: Diagnosis not present

## 2018-04-22 DIAGNOSIS — I10 Essential (primary) hypertension: Secondary | ICD-10-CM | POA: Diagnosis not present

## 2018-04-22 DIAGNOSIS — Z9884 Bariatric surgery status: Secondary | ICD-10-CM | POA: Diagnosis not present

## 2018-05-13 DIAGNOSIS — Z23 Encounter for immunization: Secondary | ICD-10-CM | POA: Diagnosis not present

## 2018-05-13 DIAGNOSIS — F331 Major depressive disorder, recurrent, moderate: Secondary | ICD-10-CM | POA: Diagnosis not present

## 2018-05-13 DIAGNOSIS — F341 Dysthymic disorder: Secondary | ICD-10-CM | POA: Diagnosis not present

## 2018-05-13 DIAGNOSIS — E785 Hyperlipidemia, unspecified: Secondary | ICD-10-CM | POA: Diagnosis not present

## 2018-05-13 DIAGNOSIS — F3175 Bipolar disorder, in partial remission, most recent episode depressed: Secondary | ICD-10-CM | POA: Diagnosis not present

## 2018-05-13 DIAGNOSIS — I10 Essential (primary) hypertension: Secondary | ICD-10-CM | POA: Diagnosis not present

## 2018-05-23 DIAGNOSIS — F332 Major depressive disorder, recurrent severe without psychotic features: Secondary | ICD-10-CM | POA: Diagnosis not present

## 2018-05-27 DIAGNOSIS — F332 Major depressive disorder, recurrent severe without psychotic features: Secondary | ICD-10-CM | POA: Diagnosis not present

## 2018-05-29 DIAGNOSIS — F332 Major depressive disorder, recurrent severe without psychotic features: Secondary | ICD-10-CM | POA: Diagnosis not present

## 2018-06-03 DIAGNOSIS — F332 Major depressive disorder, recurrent severe without psychotic features: Secondary | ICD-10-CM | POA: Diagnosis not present

## 2018-06-05 DIAGNOSIS — F332 Major depressive disorder, recurrent severe without psychotic features: Secondary | ICD-10-CM | POA: Diagnosis not present
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
## 2018-06-10 DIAGNOSIS — F332 Major depressive disorder, recurrent severe without psychotic features: Secondary | ICD-10-CM | POA: Diagnosis not present

## 2018-06-12 DIAGNOSIS — F332 Major depressive disorder, recurrent severe without psychotic features: Secondary | ICD-10-CM | POA: Diagnosis not present

## 2018-06-17 DIAGNOSIS — F332 Major depressive disorder, recurrent severe without psychotic features: Secondary | ICD-10-CM | POA: Diagnosis not present

## 2018-06-19 DIAGNOSIS — F332 Major depressive disorder, recurrent severe without psychotic features: Secondary | ICD-10-CM | POA: Diagnosis not present

## 2018-06-23 DIAGNOSIS — F331 Major depressive disorder, recurrent, moderate: Secondary | ICD-10-CM | POA: Diagnosis not present

## 2018-06-23 DIAGNOSIS — F332 Major depressive disorder, recurrent severe without psychotic features: Secondary | ICD-10-CM | POA: Diagnosis not present

## 2018-06-23 DIAGNOSIS — F341 Dysthymic disorder: Secondary | ICD-10-CM | POA: Diagnosis not present

## 2018-06-24 DIAGNOSIS — F331 Major depressive disorder, recurrent, moderate: Secondary | ICD-10-CM | POA: Diagnosis not present

## 2018-06-24 DIAGNOSIS — F341 Dysthymic disorder: Secondary | ICD-10-CM | POA: Diagnosis not present

## 2018-06-24 DIAGNOSIS — F332 Major depressive disorder, recurrent severe without psychotic features: Secondary | ICD-10-CM | POA: Diagnosis not present

## 2018-07-01 DIAGNOSIS — F332 Major depressive disorder, recurrent severe without psychotic features: Secondary | ICD-10-CM | POA: Diagnosis not present

## 2018-07-10 DIAGNOSIS — F332 Major depressive disorder, recurrent severe without psychotic features: Secondary | ICD-10-CM | POA: Diagnosis not present

## 2018-07-15 DIAGNOSIS — F332 Major depressive disorder, recurrent severe without psychotic features: Secondary | ICD-10-CM | POA: Diagnosis not present

## 2018-07-18 DIAGNOSIS — F332 Major depressive disorder, recurrent severe without psychotic features: Secondary | ICD-10-CM | POA: Diagnosis not present

## 2018-07-22 DIAGNOSIS — F332 Major depressive disorder, recurrent severe without psychotic features: Secondary | ICD-10-CM | POA: Diagnosis not present

## 2018-07-24 DIAGNOSIS — F332 Major depressive disorder, recurrent severe without psychotic features: Secondary | ICD-10-CM | POA: Diagnosis not present

## 2018-07-29 DIAGNOSIS — F332 Major depressive disorder, recurrent severe without psychotic features: Secondary | ICD-10-CM | POA: Diagnosis not present

## 2018-08-01 DIAGNOSIS — F332 Major depressive disorder, recurrent severe without psychotic features: Secondary | ICD-10-CM | POA: Diagnosis not present

## 2018-08-06 DIAGNOSIS — F332 Major depressive disorder, recurrent severe without psychotic features: Secondary | ICD-10-CM | POA: Diagnosis not present

## 2018-08-08 DIAGNOSIS — F332 Major depressive disorder, recurrent severe without psychotic features: Secondary | ICD-10-CM | POA: Diagnosis not present

## 2018-08-12 DIAGNOSIS — I10 Essential (primary) hypertension: Secondary | ICD-10-CM | POA: Diagnosis not present

## 2018-08-12 DIAGNOSIS — E559 Vitamin D deficiency, unspecified: Secondary | ICD-10-CM | POA: Diagnosis not present

## 2018-08-12 DIAGNOSIS — D509 Iron deficiency anemia, unspecified: Secondary | ICD-10-CM | POA: Diagnosis not present

## 2018-08-12 DIAGNOSIS — E785 Hyperlipidemia, unspecified: Secondary | ICD-10-CM | POA: Diagnosis not present

## 2018-08-12 DIAGNOSIS — Z9884 Bariatric surgery status: Secondary | ICD-10-CM | POA: Diagnosis not present

## 2018-08-12 DIAGNOSIS — M20011 Mallet finger of right finger(s): Secondary | ICD-10-CM | POA: Diagnosis not present

## 2018-08-12 DIAGNOSIS — M109 Gout, unspecified: Secondary | ICD-10-CM | POA: Diagnosis not present

## 2018-08-12 DIAGNOSIS — F3175 Bipolar disorder, in partial remission, most recent episode depressed: Secondary | ICD-10-CM | POA: Diagnosis not present

## 2018-08-13 DIAGNOSIS — F332 Major depressive disorder, recurrent severe without psychotic features: Secondary | ICD-10-CM | POA: Diagnosis not present

## 2018-08-15 DIAGNOSIS — F332 Major depressive disorder, recurrent severe without psychotic features: Secondary | ICD-10-CM | POA: Diagnosis not present

## 2018-08-22 DIAGNOSIS — F332 Major depressive disorder, recurrent severe without psychotic features: Secondary | ICD-10-CM | POA: Diagnosis not present

## 2018-08-27 DIAGNOSIS — F332 Major depressive disorder, recurrent severe without psychotic features: Secondary | ICD-10-CM | POA: Diagnosis not present

## 2018-08-29 DIAGNOSIS — F332 Major depressive disorder, recurrent severe without psychotic features: Secondary | ICD-10-CM | POA: Diagnosis not present

## 2018-09-02 DIAGNOSIS — M792 Neuralgia and neuritis, unspecified: Secondary | ICD-10-CM | POA: Diagnosis not present

## 2018-09-02 DIAGNOSIS — E1165 Type 2 diabetes mellitus with hyperglycemia: Secondary | ICD-10-CM | POA: Diagnosis not present

## 2018-09-02 DIAGNOSIS — I10 Essential (primary) hypertension: Secondary | ICD-10-CM | POA: Diagnosis not present

## 2018-09-02 DIAGNOSIS — E7800 Pure hypercholesterolemia, unspecified: Secondary | ICD-10-CM | POA: Diagnosis not present

## 2018-09-16 DIAGNOSIS — F332 Major depressive disorder, recurrent severe without psychotic features: Secondary | ICD-10-CM | POA: Diagnosis not present

## 2018-09-17 DIAGNOSIS — H5213 Myopia, bilateral: Secondary | ICD-10-CM | POA: Diagnosis not present

## 2018-09-17 DIAGNOSIS — H524 Presbyopia: Secondary | ICD-10-CM | POA: Diagnosis not present

## 2018-09-17 DIAGNOSIS — E119 Type 2 diabetes mellitus without complications: Secondary | ICD-10-CM | POA: Diagnosis not present

## 2018-09-17 DIAGNOSIS — H52203 Unspecified astigmatism, bilateral: Secondary | ICD-10-CM | POA: Diagnosis not present

## 2018-09-25 DIAGNOSIS — F332 Major depressive disorder, recurrent severe without psychotic features: Secondary | ICD-10-CM | POA: Diagnosis not present

## 2018-09-30 DIAGNOSIS — F332 Major depressive disorder, recurrent severe without psychotic features: Secondary | ICD-10-CM | POA: Diagnosis not present

## 2018-10-14 ENCOUNTER — Other Ambulatory Visit: Payer: Self-pay | Admitting: Obstetrics and Gynecology

## 2018-10-14 DIAGNOSIS — E2839 Other primary ovarian failure: Secondary | ICD-10-CM

## 2018-10-14 DIAGNOSIS — Z1231 Encounter for screening mammogram for malignant neoplasm of breast: Secondary | ICD-10-CM | POA: Diagnosis not present

## 2018-10-14 DIAGNOSIS — Z6825 Body mass index (BMI) 25.0-25.9, adult: Secondary | ICD-10-CM | POA: Diagnosis not present

## 2018-10-14 DIAGNOSIS — Z01419 Encounter for gynecological examination (general) (routine) without abnormal findings: Secondary | ICD-10-CM | POA: Diagnosis not present

## 2018-10-14 DIAGNOSIS — Z1389 Encounter for screening for other disorder: Secondary | ICD-10-CM | POA: Diagnosis not present

## 2018-10-14 DIAGNOSIS — Z124 Encounter for screening for malignant neoplasm of cervix: Secondary | ICD-10-CM | POA: Diagnosis not present

## 2018-10-15 DIAGNOSIS — Z124 Encounter for screening for malignant neoplasm of cervix: Secondary | ICD-10-CM | POA: Diagnosis not present

## 2018-10-15 DIAGNOSIS — R87610 Atypical squamous cells of undetermined significance on cytologic smear of cervix (ASC-US): Secondary | ICD-10-CM | POA: Diagnosis not present

## 2018-10-16 DIAGNOSIS — F332 Major depressive disorder, recurrent severe without psychotic features: Secondary | ICD-10-CM | POA: Diagnosis not present

## 2018-10-23 DIAGNOSIS — F332 Major depressive disorder, recurrent severe without psychotic features: Secondary | ICD-10-CM | POA: Diagnosis not present

## 2018-10-30 DIAGNOSIS — F332 Major depressive disorder, recurrent severe without psychotic features: Secondary | ICD-10-CM | POA: Diagnosis not present

## 2018-11-06 DIAGNOSIS — F332 Major depressive disorder, recurrent severe without psychotic features: Secondary | ICD-10-CM | POA: Diagnosis not present

## 2018-11-13 DIAGNOSIS — F332 Major depressive disorder, recurrent severe without psychotic features: Secondary | ICD-10-CM | POA: Diagnosis not present

## 2018-11-14 DIAGNOSIS — E1140 Type 2 diabetes mellitus with diabetic neuropathy, unspecified: Secondary | ICD-10-CM | POA: Diagnosis not present

## 2018-11-14 DIAGNOSIS — I10 Essential (primary) hypertension: Secondary | ICD-10-CM | POA: Diagnosis not present

## 2018-11-14 DIAGNOSIS — F3175 Bipolar disorder, in partial remission, most recent episode depressed: Secondary | ICD-10-CM | POA: Diagnosis not present

## 2018-11-14 DIAGNOSIS — E785 Hyperlipidemia, unspecified: Secondary | ICD-10-CM | POA: Diagnosis not present

## 2018-11-20 DIAGNOSIS — F332 Major depressive disorder, recurrent severe without psychotic features: Secondary | ICD-10-CM | POA: Diagnosis not present

## 2018-11-27 DIAGNOSIS — F332 Major depressive disorder, recurrent severe without psychotic features: Secondary | ICD-10-CM | POA: Diagnosis not present

## 2018-12-04 DIAGNOSIS — F332 Major depressive disorder, recurrent severe without psychotic features: Secondary | ICD-10-CM | POA: Diagnosis not present

## 2018-12-11 DIAGNOSIS — F332 Major depressive disorder, recurrent severe without psychotic features: Secondary | ICD-10-CM | POA: Diagnosis not present

## 2018-12-17 ENCOUNTER — Other Ambulatory Visit: Payer: 59

## 2018-12-18 DIAGNOSIS — F332 Major depressive disorder, recurrent severe without psychotic features: Secondary | ICD-10-CM | POA: Diagnosis not present

## 2018-12-25 DIAGNOSIS — F332 Major depressive disorder, recurrent severe without psychotic features: Secondary | ICD-10-CM | POA: Diagnosis not present

## 2019-01-01 DIAGNOSIS — F332 Major depressive disorder, recurrent severe without psychotic features: Secondary | ICD-10-CM | POA: Diagnosis not present

## 2019-01-08 DIAGNOSIS — F332 Major depressive disorder, recurrent severe without psychotic features: Secondary | ICD-10-CM | POA: Diagnosis not present

## 2019-01-15 DIAGNOSIS — F331 Major depressive disorder, recurrent, moderate: Secondary | ICD-10-CM | POA: Diagnosis not present

## 2019-01-16 ENCOUNTER — Other Ambulatory Visit: Payer: 59

## 2019-01-22 DIAGNOSIS — F332 Major depressive disorder, recurrent severe without psychotic features: Secondary | ICD-10-CM | POA: Diagnosis not present

## 2019-01-29 DIAGNOSIS — F332 Major depressive disorder, recurrent severe without psychotic features: Secondary | ICD-10-CM | POA: Diagnosis not present

## 2019-02-05 DIAGNOSIS — F332 Major depressive disorder, recurrent severe without psychotic features: Secondary | ICD-10-CM | POA: Diagnosis not present

## 2019-02-12 DIAGNOSIS — F332 Major depressive disorder, recurrent severe without psychotic features: Secondary | ICD-10-CM | POA: Diagnosis not present

## 2019-02-18 DIAGNOSIS — I10 Essential (primary) hypertension: Secondary | ICD-10-CM | POA: Diagnosis not present

## 2019-02-18 DIAGNOSIS — E559 Vitamin D deficiency, unspecified: Secondary | ICD-10-CM | POA: Diagnosis not present

## 2019-02-18 DIAGNOSIS — E785 Hyperlipidemia, unspecified: Secondary | ICD-10-CM | POA: Diagnosis not present

## 2019-02-18 DIAGNOSIS — E1140 Type 2 diabetes mellitus with diabetic neuropathy, unspecified: Secondary | ICD-10-CM | POA: Diagnosis not present

## 2019-02-19 DIAGNOSIS — F332 Major depressive disorder, recurrent severe without psychotic features: Secondary | ICD-10-CM | POA: Diagnosis not present

## 2019-02-20 DIAGNOSIS — Z9884 Bariatric surgery status: Secondary | ICD-10-CM | POA: Diagnosis not present

## 2019-02-24 ENCOUNTER — Other Ambulatory Visit: Payer: Self-pay | Admitting: Student

## 2019-02-24 DIAGNOSIS — R109 Unspecified abdominal pain: Secondary | ICD-10-CM

## 2019-02-25 ENCOUNTER — Other Ambulatory Visit: Payer: Self-pay | Admitting: Student

## 2019-02-25 ENCOUNTER — Encounter: Payer: Self-pay | Admitting: *Deleted

## 2019-02-25 ENCOUNTER — Ambulatory Visit
Admission: RE | Admit: 2019-02-25 | Discharge: 2019-02-25 | Disposition: A | Discharge status: Home / Self Care | Payer: Federal, State, Local not specified - PPO | Source: Ambulatory Visit | Attending: Student | Admitting: Student

## 2019-02-25 DIAGNOSIS — R109 Unspecified abdominal pain: Secondary | ICD-10-CM

## 2019-02-26 ENCOUNTER — Encounter: Payer: Self-pay | Admitting: Neurology

## 2019-02-26 ENCOUNTER — Ambulatory Visit: Payer: Federal, State, Local not specified - PPO | Admitting: Neurology

## 2019-02-26 ENCOUNTER — Other Ambulatory Visit: Payer: Self-pay

## 2019-02-26 VITALS — BP 122/70 | HR 64 | Temp 97.8°F | Ht 65.0 in | Wt 160.0 lb

## 2019-02-26 DIAGNOSIS — G9340 Encephalopathy, unspecified: Secondary | ICD-10-CM | POA: Diagnosis not present

## 2019-02-26 DIAGNOSIS — R419 Unspecified symptoms and signs involving cognitive functions and awareness: Secondary | ICD-10-CM | POA: Diagnosis not present

## 2019-02-26 DIAGNOSIS — F339 Major depressive disorder, recurrent, unspecified: Secondary | ICD-10-CM | POA: Diagnosis not present

## 2019-02-26 DIAGNOSIS — F39 Unspecified mood [affective] disorder: Secondary | ICD-10-CM | POA: Diagnosis not present

## 2019-02-26 DIAGNOSIS — F332 Major depressive disorder, recurrent severe without psychotic features: Secondary | ICD-10-CM | POA: Diagnosis not present

## 2019-02-26 NOTE — Patient Instructions (Addendum)
Repeat MRI brain w/wo contrast Lab work today Formal memory testing:   Memory Compensation Strategies  1. Use "WARM" strategy.  W= write it down  A= associate it  R= repeat it  M= make a mental note  2.   You can keep a Social worker.  Use a 3-ring notebook with sections for the following: calendar, important names and phone numbers,  medications, doctors' names/phone numbers, lists/reminders, and a section to journal what you did  each day.   3.    Use a calendar to write appointments down.  4.    Write yourself a schedule for the day.  This can be placed on the calendar or in a separate section of the Memory Notebook.  Keeping a  regular schedule can help memory.  5.    Use medication organizer with sections for each day or morning/evening pills.  You may need help loading it  6.    Keep a basket, or pegboard by the door.  Place items that you need to take out with you in the basket or on the pegboard.  You may also want to  include a message board for reminders.  7.    Use sticky notes.  Place sticky notes with reminders in a place where the task is performed.  For example: " turn off the  stove" placed by the stove, "lock the door" placed on the door at eye level, " take your medications" on  the bathroom mirror or by the place where you normally take your medications.  8.    Use alarms/timers.  Use while cooking to remind yourself to check on food or as a reminder to take your medicine, or as a  reminder to make a call, or as a reminder to perform another task, etc.
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
## 2019-02-27 ENCOUNTER — Telehealth: Payer: Self-pay | Admitting: Neurology

## 2019-02-27 ENCOUNTER — Encounter: Payer: Self-pay | Admitting: Neurology

## 2019-02-27 NOTE — Telephone Encounter (Signed)
LVM for pt to call back about scheduling mri  [employer] auth: [place] Ref # [id number]

## 2019-02-27 NOTE — Telephone Encounter (Signed)
Patient returned my call she is scheduled for 03/04/19 at [facility].

## 2019-03-02 LAB — METHYLMALONIC ACID, SERUM: Methylmalonic Acid: 122 nmol/L (ref 0–378)

## 2019-03-02 LAB — COMPREHENSIVE METABOLIC PANEL
ALT: 67 IU/L — ABNORMAL HIGH (ref 0–32)
AST: 83 IU/L — ABNORMAL HIGH (ref 0–40)
Albumin/Globulin Ratio: 2.2 (ref 1.2–2.2)
Albumin: 4.3 g/dL (ref 3.8–4.8)
Alkaline Phosphatase: 111 IU/L (ref 39–117)
BUN/Creatinine Ratio: 6 — ABNORMAL LOW (ref 12–28)
BUN: 6 mg/dL — ABNORMAL LOW (ref 8–27)
Bilirubin Total: 0.4 mg/dL (ref 0.0–1.2)
CO2: 25 mmol/L (ref 20–29)
Calcium: 9.9 mg/dL (ref 8.7–10.3)
Chloride: 100 mmol/L (ref 96–106)
Creatinine, Ser: 0.93 mg/dL (ref 0.57–1.00)
GFR calc Af Amer: 77 mL/min/{1.73_m2} (ref >59)
GFR calc non Af Amer: 67 mL/min/{1.73_m2} (ref >59)
Globulin, Total: 2 g/dL (ref 1.5–4.5)
Glucose: 128 mg/dL — ABNORMAL HIGH (ref 65–99)
Potassium: 4 mmol/L (ref 3.5–5.2)
Sodium: 139 mmol/L (ref 134–144)
Total Protein: 6.3 g/dL (ref 6.0–8.5)

## 2019-03-02 LAB — B12 AND FOLATE PANEL
Folate: >20 ng/mL (ref >3.0)
Vitamin B-12: 443 pg/mL (ref 232–1245)

## 2019-03-02 LAB — CBC
Hematocrit: 40.6 % (ref 34.0–46.6)
Hemoglobin: 13.5 g/dL (ref 11.1–15.9)
MCH: 29.9 pg (ref 26.6–33.0)
MCHC: 33.3 g/dL (ref 31.5–35.7)
MCV: 90 fL (ref 79–97)
Platelets: 197 10*3/uL (ref 150–450)
RBC: 4.51 x10E6/uL (ref 3.77–5.28)
RDW: 12.5 % (ref 11.7–15.4)
WBC: 6.2 10*3/uL (ref 3.4–10.8)

## 2019-03-02 LAB — HOMOCYSTEINE: Homocysteine: 11.7 umol/L (ref 0.0–17.2)

## 2019-03-02 LAB — HIV ANTIBODY (ROUTINE TESTING W REFLEX): HIV Screen 4th Generation wRfx: NONREACTIVE

## 2019-03-02 LAB — VITAMIN B6: Vitamin B6: 8.9 ug/L (ref 2.0–32.8)

## 2019-03-02 LAB — RPR: RPR Ser Ql: NONREACTIVE

## 2019-03-02 LAB — VITAMIN B1: Thiamine: 106.5 nmol/L (ref 66.5–200.0)

## 2019-03-03 ENCOUNTER — Telehealth: Payer: Self-pay | Admitting: *Deleted

## 2019-03-03 NOTE — Telephone Encounter (Signed)
Spoke with patient and discussed lab results. She understands her liver enzymes are elevated which could be caused by many different reasons and that Dr. Jaynee Eagles advises pt to follow up with primary care within the next 6-12 weeks or sooner if possible. Her questions were answered and she verbalized appreciation for the call.

## 2019-03-03 NOTE — Telephone Encounter (Signed)
-----   Message from Melvenia Beam, MD sent at 03/02/2019  7:37 PM EDT ----- Patient's liver enzymes are elevated. There can be many reasons such as alcohol abuse, fatty liver, infections and others. This should be followed up by primary care within the next 6-12 weeks or sooner if possible thanks

## 2019-03-04 ENCOUNTER — Encounter: Payer: Self-pay | Admitting: Psychology

## 2019-03-04 ENCOUNTER — Ambulatory Visit: Payer: Federal, State, Local not specified - PPO

## 2019-03-04 ENCOUNTER — Other Ambulatory Visit: Payer: Self-pay

## 2019-03-04 DIAGNOSIS — G9340 Encephalopathy, unspecified: Secondary | ICD-10-CM

## 2019-03-04 DIAGNOSIS — F339 Major depressive disorder, recurrent, unspecified: Secondary | ICD-10-CM | POA: Diagnosis not present

## 2019-03-04 DIAGNOSIS — F39 Unspecified mood [affective] disorder: Secondary | ICD-10-CM

## 2019-03-04 DIAGNOSIS — R419 Unspecified symptoms and signs involving cognitive functions and awareness: Secondary | ICD-10-CM | POA: Diagnosis not present

## 2019-03-04 MED ORDER — GADOBENATE DIMEGLUMINE 529 MG/ML IV SOLN
15.0000 mL | Freq: Once | INTRAVENOUS | Status: AC | PRN
  Administered 2019-03-04: 15 mL via INTRAVENOUS

## 2019-03-05 ENCOUNTER — Other Ambulatory Visit: Payer: Self-pay | Admitting: Student

## 2019-03-05 DIAGNOSIS — R748 Abnormal levels of other serum enzymes: Secondary | ICD-10-CM | POA: Diagnosis not present

## 2019-03-05 DIAGNOSIS — R109 Unspecified abdominal pain: Secondary | ICD-10-CM

## 2019-03-05 DIAGNOSIS — F332 Major depressive disorder, recurrent severe without psychotic features: Secondary | ICD-10-CM | POA: Diagnosis not present

## 2019-03-06 ENCOUNTER — Encounter: Payer: Self-pay | Admitting: Neurology

## 2019-03-07 ENCOUNTER — Ambulatory Visit
Admission: RE | Admit: 2019-03-07 | Discharge: 2019-03-07 | Disposition: A | Discharge status: Home / Self Care | Payer: Federal, State, Local not specified - PPO | Source: Ambulatory Visit | Attending: Student | Admitting: Student

## 2019-03-07 DIAGNOSIS — R109 Unspecified abdominal pain: Secondary | ICD-10-CM

## 2019-03-10 DIAGNOSIS — E7800 Pure hypercholesterolemia, unspecified: Secondary | ICD-10-CM | POA: Diagnosis not present

## 2019-03-10 DIAGNOSIS — I10 Essential (primary) hypertension: Secondary | ICD-10-CM | POA: Diagnosis not present

## 2019-03-10 DIAGNOSIS — E1165 Type 2 diabetes mellitus with hyperglycemia: Secondary | ICD-10-CM | POA: Diagnosis not present

## 2019-03-10 DIAGNOSIS — E559 Vitamin D deficiency, unspecified: Secondary | ICD-10-CM | POA: Diagnosis not present

## 2019-03-11 ENCOUNTER — Telehealth: Payer: Self-pay | Admitting: *Deleted

## 2019-03-11 NOTE — Telephone Encounter (Signed)
I spoke with the patient and advised her that her MRI brain is normal for age. She asked to have the results sent to Dr. Toy Care, referring MD. I advised her we would send them. She verbalized appreciation.   MRI results routed to Dr. Chucky May per pt request.

## 2019-03-12 DIAGNOSIS — F332 Major depressive disorder, recurrent severe without psychotic features: Secondary | ICD-10-CM | POA: Diagnosis not present

## 2019-03-13 ENCOUNTER — Other Ambulatory Visit: Payer: Self-pay | Admitting: Family Medicine

## 2019-03-13 DIAGNOSIS — R7989 Other specified abnormal findings of blood chemistry: Secondary | ICD-10-CM

## 2019-03-13 DIAGNOSIS — D490 Neoplasm of unspecified behavior of digestive system: Secondary | ICD-10-CM

## 2019-03-13 DIAGNOSIS — K760 Fatty (change of) liver, not elsewhere classified: Secondary | ICD-10-CM

## 2019-03-14 ENCOUNTER — Other Ambulatory Visit: Payer: Self-pay

## 2019-03-14 ENCOUNTER — Ambulatory Visit
Admission: RE | Admit: 2019-03-14 | Discharge: 2019-03-14 | Disposition: A | Discharge status: Home / Self Care | Payer: 59 | Source: Ambulatory Visit | Attending: Obstetrics and Gynecology | Admitting: Obstetrics and Gynecology

## 2019-03-14 DIAGNOSIS — E2839 Other primary ovarian failure: Secondary | ICD-10-CM

## 2019-03-14 DIAGNOSIS — M85851 Other specified disorders of bone density and structure, right thigh: Secondary | ICD-10-CM | POA: Diagnosis not present

## 2019-03-18 DIAGNOSIS — F331 Major depressive disorder, recurrent, moderate: Secondary | ICD-10-CM | POA: Diagnosis not present

## 2019-03-19 DIAGNOSIS — F332 Major depressive disorder, recurrent severe without psychotic features: Secondary | ICD-10-CM | POA: Diagnosis not present

## 2019-03-20 ENCOUNTER — Ambulatory Visit
Admission: RE | Admit: 2019-03-20 | Discharge: 2019-03-20 | Disposition: A | Discharge status: Home / Self Care | Payer: Federal, State, Local not specified - PPO | Source: Ambulatory Visit | Attending: Family Medicine | Admitting: Family Medicine

## 2019-03-20 DIAGNOSIS — K760 Fatty (change of) liver, not elsewhere classified: Secondary | ICD-10-CM

## 2019-03-20 DIAGNOSIS — D490 Neoplasm of unspecified behavior of digestive system: Secondary | ICD-10-CM

## 2019-03-20 DIAGNOSIS — R945 Abnormal results of liver function studies: Secondary | ICD-10-CM | POA: Diagnosis not present

## 2019-03-20 DIAGNOSIS — R7989 Other specified abnormal findings of blood chemistry: Secondary | ICD-10-CM

## 2019-03-24 DIAGNOSIS — K7689 Other specified diseases of liver: Secondary | ICD-10-CM | POA: Diagnosis not present

## 2019-03-25 DIAGNOSIS — R748 Abnormal levels of other serum enzymes: Secondary | ICD-10-CM | POA: Diagnosis not present

## 2019-03-26 DIAGNOSIS — F332 Major depressive disorder, recurrent severe without psychotic features: Secondary | ICD-10-CM | POA: Diagnosis not present

## 2019-04-02 DIAGNOSIS — F332 Major depressive disorder, recurrent severe without psychotic features: Secondary | ICD-10-CM | POA: Diagnosis not present

## 2019-04-03 ENCOUNTER — Encounter: Payer: Self-pay | Admitting: Psychology

## 2019-04-03 ENCOUNTER — Other Ambulatory Visit: Payer: Self-pay

## 2019-04-03 ENCOUNTER — Encounter: Payer: Federal, State, Local not specified - PPO | Attending: Psychology | Admitting: Psychology

## 2019-04-03 DIAGNOSIS — F09 Unspecified mental disorder due to known physiological condition: Secondary | ICD-10-CM | POA: Insufficient documentation

## 2019-04-03 DIAGNOSIS — R413 Other amnesia: Secondary | ICD-10-CM | POA: Insufficient documentation

## 2019-04-03 DIAGNOSIS — F0789 Other personality and behavioral disorders due to known physiological condition: Secondary | ICD-10-CM | POA: Insufficient documentation

## 2019-04-03 DIAGNOSIS — F0630 Mood disorder due to known physiological condition, unspecified: Secondary | ICD-10-CM | POA: Diagnosis not present
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
## 2019-04-07 ENCOUNTER — Encounter: Payer: Federal, State, Local not specified - PPO | Admitting: Psychology

## 2019-04-07 ENCOUNTER — Other Ambulatory Visit: Payer: Self-pay

## 2019-04-07 ENCOUNTER — Encounter: Payer: Self-pay | Admitting: Psychology

## 2019-04-07 DIAGNOSIS — F09 Unspecified mental disorder due to known physiological condition: Secondary | ICD-10-CM

## 2019-04-07 DIAGNOSIS — F0630 Mood disorder due to known physiological condition, unspecified: Secondary | ICD-10-CM | POA: Diagnosis not present

## 2019-04-07 DIAGNOSIS — F0789 Other personality and behavioral disorders due to known physiological condition: Secondary | ICD-10-CM | POA: Diagnosis not present

## 2019-04-07 DIAGNOSIS — R413 Other amnesia: Secondary | ICD-10-CM | POA: Diagnosis not present

## 2019-04-07 NOTE — Progress Notes (Addendum)
The patient arrived on time to her 8:00 testing appointment. The evaluation lasted 240 minutes.  Mental Status & Behavioral Observations:  Appearance: Casually dressed, appropriately groomed.  Gait:  Ambulated independently without assistance, reduced balance.  Speech:  Mild dysarthria, reduced rate, low tone, & volume Thought process: Slightly disorganized and concrete.  Mood & Affect: Depressed, blunted Interpersonal: Polite, appropriate Orientation: Oriented x 4  Tests Administered: . Comprehensive Attention Battery (CAB) . CAB Continuous Performance Test (CAB-CPT) . Repeatable Battery for the Assessment of Neuropsychological Status, Form B  . Wide Range Achievement Test, 5th Edition, Blue Form o Word Reading   Results:  Measure RBANS-B Standard or Scaled Score Percentile Description  Immediate Memory 69 2 Impaired  List Learning 6 9 Low Average  Story Memory 3 1 Impaired  Visuospatial/Constructional 87 19 Low Average  Figure Copy 10 50 Average  Line Orientation - 3-9 Borderline-Low Average  Language 82 12 Low Average  Picture Naming - 51-75 Average  Semantic Fluency 3 1 Impaired  Attention 79 8 Borderline  Digit Span 7 16 Low Average  Coding 6 9 Low Average  Delayed Memory 95 37 Average  List Recall - 51-75 Average  List Recognition - 26-50 Average  Story Recall 4 2 Borderline  Figure Recall 9 37 Average  Total Score 78 7 Borderline   WRAT-5 (Blue Form) . Raw=40/70, SS=70, 2nd %, Very Low . Grade Equivalent=3.6

## 2019-04-09 ENCOUNTER — Other Ambulatory Visit: Payer: Self-pay

## 2019-04-09 DIAGNOSIS — Z20822 Contact with and (suspected) exposure to covid-19: Secondary | ICD-10-CM

## 2019-04-09 DIAGNOSIS — F332 Major depressive disorder, recurrent severe without psychotic features: Secondary | ICD-10-CM | POA: Diagnosis not present

## 2019-04-11 LAB — NOVEL CORONAVIRUS, NAA: SARS-CoV-2, NAA: NOT DETECTED

## 2019-04-14 ENCOUNTER — Telehealth: Payer: Self-pay | Admitting: General Practice

## 2019-04-14 NOTE — Telephone Encounter (Signed)
Negative COVID results given. Patient results "NOT Detected." Caller expressed understanding. ° °

## 2019-04-15 DIAGNOSIS — F341 Dysthymic disorder: Secondary | ICD-10-CM | POA: Diagnosis not present

## 2019-04-16 DIAGNOSIS — F332 Major depressive disorder, recurrent severe without psychotic features: Secondary | ICD-10-CM | POA: Diagnosis not present

## 2019-04-30 DIAGNOSIS — F332 Major depressive disorder, recurrent severe without psychotic features: Secondary | ICD-10-CM | POA: Diagnosis not present

## 2019-05-07 DIAGNOSIS — F332 Major depressive disorder, recurrent severe without psychotic features: Secondary | ICD-10-CM | POA: Diagnosis not present

## 2019-05-14 DIAGNOSIS — F332 Major depressive disorder, recurrent severe without psychotic features: Secondary | ICD-10-CM | POA: Diagnosis not present

## 2019-05-21 DIAGNOSIS — F332 Major depressive disorder, recurrent severe without psychotic features: Secondary | ICD-10-CM | POA: Diagnosis not present

## 2019-05-27 DIAGNOSIS — R740 Nonspecific elevation of levels of transaminase and lactic acid dehydrogenase [LDH]: Secondary | ICD-10-CM | POA: Diagnosis not present

## 2019-05-27 DIAGNOSIS — I10 Essential (primary) hypertension: Secondary | ICD-10-CM | POA: Diagnosis not present

## 2019-05-27 DIAGNOSIS — Z23 Encounter for immunization: Secondary | ICD-10-CM | POA: Diagnosis not present

## 2019-05-27 DIAGNOSIS — E785 Hyperlipidemia, unspecified: Secondary | ICD-10-CM | POA: Diagnosis not present

## 2019-05-27 DIAGNOSIS — F332 Major depressive disorder, recurrent severe without psychotic features: Secondary | ICD-10-CM | POA: Diagnosis not present

## 2019-05-29 DIAGNOSIS — G4733 Obstructive sleep apnea (adult) (pediatric): Secondary | ICD-10-CM | POA: Diagnosis not present

## 2019-06-04 DIAGNOSIS — F332 Major depressive disorder, recurrent severe without psychotic features: Secondary | ICD-10-CM | POA: Diagnosis not present

## 2019-06-11 DIAGNOSIS — F332 Major depressive disorder, recurrent severe without psychotic features: Secondary | ICD-10-CM | POA: Diagnosis not present

## 2019-06-12 ENCOUNTER — Encounter: Payer: Self-pay | Admitting: Psychology

## 2019-06-12 ENCOUNTER — Encounter: Payer: Federal, State, Local not specified - PPO | Attending: Psychology | Admitting: Psychology

## 2019-06-12 ENCOUNTER — Other Ambulatory Visit: Payer: Self-pay

## 2019-06-12 DIAGNOSIS — G4733 Obstructive sleep apnea (adult) (pediatric): Secondary | ICD-10-CM

## 2019-06-12 DIAGNOSIS — F0789 Other personality and behavioral disorders due to known physiological condition: Secondary | ICD-10-CM

## 2019-06-12 DIAGNOSIS — F0781 Postconcussional syndrome: Secondary | ICD-10-CM | POA: Diagnosis not present

## 2019-06-12 DIAGNOSIS — R413 Other amnesia: Secondary | ICD-10-CM | POA: Insufficient documentation

## 2019-06-12 DIAGNOSIS — F0630 Mood disorder due to known physiological condition, unspecified: Secondary | ICD-10-CM

## 2019-06-12 DIAGNOSIS — F09 Unspecified mental disorder due to known physiological condition: Secondary | ICD-10-CM
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
## 2019-06-18 DIAGNOSIS — F332 Major depressive disorder, recurrent severe without psychotic features: Secondary | ICD-10-CM | POA: Diagnosis not present

## 2019-06-25 DIAGNOSIS — F332 Major depressive disorder, recurrent severe without psychotic features: Secondary | ICD-10-CM | POA: Diagnosis not present

## 2019-06-26 ENCOUNTER — Other Ambulatory Visit: Payer: Self-pay

## 2019-06-26 ENCOUNTER — Ambulatory Visit (INDEPENDENT_AMBULATORY_CARE_PROVIDER_SITE_OTHER): Payer: Federal, State, Local not specified - PPO | Admitting: Psychiatry

## 2019-06-26 ENCOUNTER — Encounter: Payer: Self-pay | Admitting: Psychiatry

## 2019-06-26 DIAGNOSIS — F332 Major depressive disorder, recurrent severe without psychotic features: Secondary | ICD-10-CM

## 2019-06-26 DIAGNOSIS — G4733 Obstructive sleep apnea (adult) (pediatric): Secondary | ICD-10-CM | POA: Diagnosis not present

## 2019-06-26 NOTE — Progress Notes (Signed)
ECT consultation for this 63 year old woman referred by her outpatient psychiatrist, Dr.Kaur.  Patient was reached by telephone.  Connection was good.  Patient was cooperative and understood the nature of the conversation.  Patient was identified and the topic at hand was identified.  Patient states that she has been suffering with depression for many years has been seeing Dr. Robina Ade for it since about 2014.  She says it is been at least 4 years since she can remember a time when she was feeling good.  She has been on multiple antidepressants and had multiple other treatments without success.  Patient states currently her mood feels sad down negative and "depressed" most all of the time.  She feels unmotivated and unable to do any activities around the house.  Her sleep is poor with frequent awakening at night.  Appetite is stable.  Patient says she has suicidal thoughts but does not have any specific plan or intent to act on it.  Denies auditory or visual hallucinations.  Able to site some positive things in her life that make it worth living for her.  Patient is currently being treated with Trintellix, Adderall, Deplin, trazodone, lithium, zolpidem and Rexulti.  She says she has been on these medicines for years and does not feel they have been of any benefit.  In addition she is currently going for once a week ketamine treatments which she also says she does not find particularly helpful.  Patient is also complaining of memory problems.  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  Patient lives with her husband.  She is retired.  She says she has very little to do around the house very little activity.  She wants to do more and clean up the house but does not feel motivated to do it.  Describes her relationship with her husband is  good.  Medical history: Patient has some significant medical problems with diabetes that requires regular insulin treatment also high blood pressure and elevated cholesterol  Patient's mental status is a patient with slow speech and what sounds like significant thought blocking.  Long spells of time before she can answer questions.  She is however alert and oriented x4 and understood the nature of the treatment being discussed.  Stated her mood is depressed.  Vocal affect sounded flat and withdrawn.  Did not appear to have any delusional content.  Denied auditory or visual hallucinations.  Has suicidal thoughts without any specific plan and no intent no homicidal thoughts.  Denies any hallucinations.  Patient was alert and oriented x4 and despite very slow response and thinking was able to ask appropriate questions and showed good understanding of what we were discussing.  Patient does not know of any family history  Patient has not had ECT in the past.  Reviewed with the patient the specifics of ECT describing the nature of the treatment and the patient experience that we have here at [facility].  Reviewed the potential side effects and benefits including side effects of short-term memory impairment pain nausea and dizziness.  Patient stated she would like to proceed with treatment.  Based on her diagnosis of severe major depression recurrent without response to multiple medicines and even ketamine treatment she is an appropriate candidate.  I advised her that we would like to start if possible by next Wednesday the  fourth if we are going to proceed with treatment so that we can try to get a whole course in by Thanksgiving.  I have sent an order for the preadmission testing labs and and given the patient the telephone numbers to call there and to call the ECT office and will notify ECT nursing.

## 2019-06-27 ENCOUNTER — Other Ambulatory Visit: Payer: Self-pay

## 2019-06-27 ENCOUNTER — Encounter
Admission: RE | Admit: 2019-06-27 | Discharge: 2019-06-27 | Disposition: A | Discharge status: Home / Self Care | Payer: Federal, State, Local not specified - PPO | Source: Ambulatory Visit | Attending: Psychiatry | Admitting: Psychiatry

## 2019-06-27 ENCOUNTER — Ambulatory Visit
Admission: RE | Admit: 2019-06-27 | Discharge: 2019-06-27 | Disposition: A | Discharge status: Home / Self Care | Payer: Federal, State, Local not specified - PPO | Source: Ambulatory Visit | Attending: Psychiatry | Admitting: Psychiatry

## 2019-06-27 DIAGNOSIS — Z01811 Encounter for preprocedural respiratory examination: Secondary | ICD-10-CM

## 2019-06-27 DIAGNOSIS — Z01810 Encounter for preprocedural cardiovascular examination: Secondary | ICD-10-CM | POA: Diagnosis not present

## 2019-06-27 DIAGNOSIS — Z01818 Encounter for other preprocedural examination: Secondary | ICD-10-CM | POA: Diagnosis not present

## 2019-06-27 NOTE — Pre-Procedure Instructions (Signed)
Patient given pre-procedure instructions for ECT. The patient verbalized understanding.Questions were answered regarding the procedure pre and post care. The patient was oriented to the location of the Same Day Surgery Unit and where the patient registers the day of the procedure. She stated she was not as nervous of the procedure after her orientation. Patient understands that she is to call to schedule a day to receive a COVID-19 test next week prior to her appointment November 9th.

## 2019-06-30 ENCOUNTER — Telehealth: Payer: Self-pay

## 2019-07-01 ENCOUNTER — Ambulatory Visit: Payer: Federal, State, Local not specified - PPO | Admitting: Psychology

## 2019-07-01 ENCOUNTER — Other Ambulatory Visit
Admission: RE | Admit: 2019-07-01 | Discharge: 2019-07-01 | Disposition: A | Discharge status: Home / Self Care | Payer: Federal, State, Local not specified - PPO | Source: Ambulatory Visit | Attending: Psychiatry | Admitting: Psychiatry

## 2019-07-01 ENCOUNTER — Other Ambulatory Visit: Payer: Self-pay

## 2019-07-01 DIAGNOSIS — Z01812 Encounter for preprocedural laboratory examination: Secondary | ICD-10-CM | POA: Diagnosis not present

## 2019-07-01 DIAGNOSIS — Z20828 Contact with and (suspected) exposure to other viral communicable diseases: Secondary | ICD-10-CM | POA: Diagnosis not present

## 2019-07-01 LAB — SARS CORONAVIRUS 2 (TAT 6-24 HRS): SARS Coronavirus 2: NEGATIVE

## 2019-07-02 DIAGNOSIS — F332 Major depressive disorder, recurrent severe without psychotic features: Secondary | ICD-10-CM | POA: Diagnosis not present

## 2019-07-06 ENCOUNTER — Other Ambulatory Visit: Payer: Self-pay | Admitting: Psychiatry

## 2019-07-07 ENCOUNTER — Encounter: Payer: Self-pay | Admitting: Certified Registered"

## 2019-07-07 ENCOUNTER — Other Ambulatory Visit: Payer: Self-pay

## 2019-07-07 ENCOUNTER — Encounter
Admission: RE | Admit: 2019-07-07 | Discharge: 2019-07-07 | Disposition: A | Discharge status: Home / Self Care | Payer: Federal, State, Local not specified - PPO | Source: Ambulatory Visit | Attending: Psychiatry | Admitting: Psychiatry

## 2019-07-07 DIAGNOSIS — M1990 Unspecified osteoarthritis, unspecified site: Secondary | ICD-10-CM | POA: Diagnosis not present

## 2019-07-07 DIAGNOSIS — E785 Hyperlipidemia, unspecified: Secondary | ICD-10-CM | POA: Diagnosis not present

## 2019-07-07 DIAGNOSIS — E1140 Type 2 diabetes mellitus with diabetic neuropathy, unspecified: Secondary | ICD-10-CM | POA: Insufficient documentation

## 2019-07-07 DIAGNOSIS — Z794 Long term (current) use of insulin: Secondary | ICD-10-CM | POA: Diagnosis not present

## 2019-07-07 DIAGNOSIS — G4730 Sleep apnea, unspecified: Secondary | ICD-10-CM | POA: Insufficient documentation

## 2019-07-07 DIAGNOSIS — I10 Essential (primary) hypertension: Secondary | ICD-10-CM | POA: Diagnosis not present

## 2019-07-07 DIAGNOSIS — F319 Bipolar disorder, unspecified: Secondary | ICD-10-CM | POA: Diagnosis not present

## 2019-07-07 DIAGNOSIS — E1136 Type 2 diabetes mellitus with diabetic cataract: Secondary | ICD-10-CM | POA: Insufficient documentation

## 2019-07-07 DIAGNOSIS — R413 Other amnesia: Secondary | ICD-10-CM | POA: Diagnosis not present

## 2019-07-07 LAB — COMPREHENSIVE METABOLIC PANEL
ALT: 66 U/L — ABNORMAL HIGH (ref 0–44)
AST: 73 U/L — ABNORMAL HIGH (ref 15–41)
Albumin: 3.9 g/dL (ref 3.5–5.0)
Alkaline Phosphatase: 108 U/L (ref 38–126)
Anion gap: 9 (ref 5–15)
BUN: 9 mg/dL (ref 8–23)
CO2: 24 mmol/L (ref 22–32)
Calcium: 9.6 mg/dL (ref 8.9–10.3)
Chloride: 104 mmol/L (ref 98–111)
Creatinine, Ser: 0.98 mg/dL (ref 0.44–1.00)
GFR calc Af Amer: >60 mL/min (ref >60)
GFR calc non Af Amer: >60 mL/min (ref >60)
Glucose, Bld: 380 mg/dL — ABNORMAL HIGH (ref 70–99)
Potassium: 3.9 mmol/L (ref 3.5–5.1)
Sodium: 137 mmol/L (ref 135–145)
Total Bilirubin: 0.7 mg/dL (ref 0.3–1.2)
Total Protein: 6.8 g/dL (ref 6.5–8.1)

## 2019-07-07 LAB — CBC
HCT: 37.9 % (ref 36.0–46.0)
Hemoglobin: 12.2 g/dL (ref 12.0–15.0)
MCH: 29.4 pg (ref 26.0–34.0)
MCHC: 32.2 g/dL (ref 30.0–36.0)
MCV: 91.3 fL (ref 80.0–100.0)
Platelets: 183 10*3/uL (ref 150–400)
RBC: 4.15 MIL/uL (ref 3.87–5.11)
RDW: 12.7 % (ref 11.5–15.5)
WBC: 6.4 10*3/uL (ref 4.0–10.5)
nRBC: 0 % (ref 0.0–0.2)

## 2019-07-07 MED ORDER — SODIUM CHLORIDE 0.9 % IV SOLN
500.0000 mL | Freq: Once | INTRAVENOUS | Status: AC
  Administered 2019-07-07: 500 mL via INTRAVENOUS

## 2019-07-07 NOTE — H&P (Signed)
Amy [name] is an 63 y.o. female.   Chief Complaint: chronic severe depression without psychosis or acute suicidal plan HPI: recurrent depression resistant to multiple medication trials  Past Medical History:  Diagnosis Date  . ADHD (attention deficit hyperactivity disorder)   . Anemia   . Anxiety   . Bipolar disorder ([place])   . Carpal tunnel syndrome of right wrist 09/2014  . Cataract, immature   . Depression   . Diabetic neuropathy ([facility])    feet  . Dyslipidemia   . GERD (gastroesophageal reflux disease)   . Hypertension    states under control with med., has been on med. x 20 yrs.  . Insulin dependent diabetes mellitus   . Major depressive disorder   . Osteoarthritis    knees  . SLEEP APNEA    uses CPAP nightly  . Vitamin D deficiency     Past Surgical History:  Procedure Laterality Date  . BREAST REDUCTION SURGERY Bilateral   . CARPAL TUNNEL RELEASE Right 10/08/2014   Procedure: RIGHT CARPAL TUNNEL RELEASE;  Surgeon: Daryll Brod, MD;  Location: [place];  Service: Orthopedics;  Laterality: Right;  . COLONOSCOPY WITH PROPOFOL  10/27/2013  . ESOPHAGOGASTRODUODENOSCOPY  02/03/2004  . LAPAROSCOPIC GASTRIC BANDING  12/05/2005    Family History  Problem Relation Age of Onset  . Heart disease Father   . Heart attack Father   . Breast cancer Sister   . Diabetes Sister   . Sarcoidosis Brother   . Cancer Brother        prostate  . Heart failure Mother   . Colon cancer Neg Hx   . Dementia Neg Hx    Social History:  reports that she has never smoked. She has never used smokeless tobacco. She reports that she does not drink alcohol or use drugs.  Allergies: No Known Allergies  (Not in a hospital admission)   Results for orders placed or performed during the hospital encounter of 07/07/19 (from the past 48 hour(s))  Comprehensive metabolic panel     Status: Abnormal   Collection Time: 07/07/19  8:58 AM  Result Value Ref Range   Sodium 137 135 - 145  mmol/L   Potassium 3.9 3.5 - 5.1 mmol/L   Chloride 104 98 - 111 mmol/L   CO2 24 22 - 32 mmol/L   Glucose, Bld 380 (H) 70 - 99 mg/dL   BUN 9 8 - 23 mg/dL   Creatinine, Ser 0.98 0.44 - 1.00 mg/dL   Calcium 9.6 8.9 - 10.3 mg/dL   Total Protein 6.8 6.5 - 8.1 g/dL   Albumin 3.9 3.5 - 5.0 g/dL   AST 73 (H) 15 - 41 U/L   ALT 66 (H) 0 - 44 U/L   Alkaline Phosphatase 108 38 - 126 U/L   Total Bilirubin 0.7 0.3 - 1.2 mg/dL   GFR calc non Af Amer >60 >60 mL/min   GFR calc Af Amer >60 >60 mL/min   Anion gap 9 5 - 15    Comment: Performed at [facility], [address] [place], [place] [address]  CBC     Status: None   Collection Time: 07/07/19  8:58 AM  Result Value Ref Range   WBC 6.4 4.0 - 10.5 K/uL   RBC 4.15 3.87 - 5.11 MIL/uL   Hemoglobin 12.2 12.0 - 15.0 g/dL   HCT 37.9 36.0 - 46.0 %   MCV 91.3 80.0 - 100.0 fL   MCH 29.4 26.0 - 34.0  pg   MCHC 32.2 30.0 - 36.0 g/dL   RDW 12.7 11.5 - 15.5 %   Platelets 183 150 - 400 K/uL   nRBC 0.0 0.0 - 0.2 %    Comment: Performed at [facility], [address] [place], [place] [address]   No results found.  Review of Systems  Constitutional: Negative.   HENT: Negative.   Eyes: Negative.   Respiratory: Negative.   Cardiovascular: Negative.   Gastrointestinal: Negative.   Musculoskeletal: Negative.   Skin: Negative.   Neurological: Negative.   Psychiatric/Behavioral: Positive for depression. Negative for hallucinations, memory loss, substance abuse and suicidal ideas. The patient is nervous/anxious and has insomnia.     Blood pressure 126/82, pulse 60, temperature 98.1 F (36.7 C), temperature source Oral, resp. rate 18, SpO2 100 %. Physical Exam  Nursing note and vitals reviewed. Constitutional: She appears well-developed and well-nourished.  HENT:  Head: Normocephalic and atraumatic.  Eyes: Pupils are equal, round, and reactive to light. Conjunctivae are normal.  Neck: Normal range of motion.  Cardiovascular:  Regular rhythm and normal heart sounds.  Respiratory: Effort normal.  GI: Soft.  Musculoskeletal: Normal range of motion.  Neurological: She is alert.  Skin: Skin is warm and dry.  Psychiatric: Judgment normal. Her affect is blunt. Her speech is delayed. She is slowed. Cognition and memory are normal. She exhibits a depressed mood. She expresses no homicidal and no suicidal ideation.     Assessment/Plan Begin RUL treatment today as outpatient  [name], MD 07/07/2019, 9:45 AM

## 2019-07-08 ENCOUNTER — Other Ambulatory Visit: Payer: Self-pay | Admitting: Psychiatry

## 2019-07-08 ENCOUNTER — Encounter: Payer: Federal, State, Local not specified - PPO | Attending: Psychology | Admitting: Psychology

## 2019-07-08 DIAGNOSIS — F0630 Mood disorder due to known physiological condition, unspecified: Secondary | ICD-10-CM | POA: Diagnosis not present

## 2019-07-08 DIAGNOSIS — F0781 Postconcussional syndrome: Secondary | ICD-10-CM

## 2019-07-08 DIAGNOSIS — F09 Unspecified mental disorder due to known physiological condition: Secondary | ICD-10-CM

## 2019-07-08 DIAGNOSIS — R413 Other amnesia: Secondary | ICD-10-CM

## 2019-07-08 DIAGNOSIS — F0789 Other personality and behavioral disorders due to known physiological condition: Secondary | ICD-10-CM

## 2019-07-08 NOTE — Progress Notes (Signed)
Today, provided feedback regarding the results of the recent neuropsychological evaluation.  This visit was a 1 hour in person visit that consisted of myself and the patient in my outpatient clinic office.  We reviewed the results of the testing and went over treatment recommendations.  It will be critical for the patient to maintain good health patterns including good diet, sleep patterns and physical activity as she has had issues related to potentially small vessel disease as well as transient ischemic attacks.  Blood pressure and overall metabolic status will be important including issues related to her type 2 diabetes.  The complete neuropsychological evaluation report can be found on her visit dated 06/12/2019 and is available in her electronic medical records.  Below is the summary and interpretations from the evaluation.    Summary of Results:                        Overall, the results of the current neuropsychological evaluation indicate that the patient comes into this assessment with fairly low academic achievement levels but better intellectual functioning.  This is more likely reflective of poor academic experience growing up and not a lot of focus on furthering academic knowledge over time.  However, the patient also showed indications of a central auditory processing deficit on more complex issues that was reflected in both her story learning deficits greater than list learning deficits on memory task as well as pattern seen on the comprehensive attention battery.  Therefore central auditory deficit issues may have played a role in her poor academic development and her younger years.  The patient is also showing some significant issues with attention and concentration.  While the patient shows good encoding both auditorily and visually for straightforward encoding task, when they become more dynamic and requiring processing of that encoded information, the patient shows much greater deficits  reflexive of difficulty with multi processing of information.  The patient has some significant reductions in information processing speed and focus execute abilities but does very well on shifting of attention and also is generally free from external distractions and is able to avoid external distractions.  The patient shows significant deficits with regard to sustaining attention as well and once the patient has been on a task for approximately 6 minutes or so her attention and concentration begins to deteriorate significantly.  The attentional deficits are primarily around deficits with central auditory processing, multi processing, slowed information processing speed particularly when the cognitive demands are greater, and significant impairments with regard to sustained attention.  The patient does well on cognitive shifting measures as well as doing well on straightforward primary encoding task.  The patient shows some variability in expressive language functioning with verbal fluency aspects being quite low but doing well on targeted/cued naming issues.  This likely is also reflective of her central auditory processing deficits.  The patient's constructional abilities as part of her visual-spatial abilities are quite good but she shows impairments with regard to visual judgment estimation.  As far as memory components go the patient did fairly well on primary encoding but had difficulty initially organizing and storing information both in a story form as well as list form but greater deficits for story learning.  The patient's delayed recall improved significantly relative to a normative population and this pattern strongly suggest that if information is actually encoded and organized it is available for free recall at after a delay.  The deficits have to do with storage  and organization of information rather than an inability to retain that information over period of delay.  Impression/Diagnosis:                      The results of the current neuropsychological evaluation are not consistent with those patterns typically seen with degenerative dementia such as Alzheimer's or other cortically/sub-cortically mediated dementia processes.  The patient likely has some issues that go back for her entire lifetime related to central auditory processing issues and potentially some learning disabilities when she was in school.  The patient shows attention and concentration deficits and information processing deficits that would be consistent with either small vessel disease/micro-ischemic changes in white matter regions or patterns consistent with the residual effects of the numerous concussive events the patient has had.  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  Because of these multitude of previous significant concussive events have left her left resilient and with less redundant capacity in these white matter tracts, she is having a greater impact in these early stages of small vessel disease.  Also compounding her cognitive weaknesses are the fact that she is not adequately treating her sleep apnea.  The patient reports that she was diagnosed with sleep apnea 5 years ago but has not really been using her CPAP machine for the past 5 years.  It will be very important for the patient to return to using her CPAP device.  More recently, the patient has lost considerable weight and it appears her sleep apnea has improved.  She will need to keep her weight under control.  The patient also has underlying bipolar disorder that at the time is well managed but also has times of depression and anxiety, which would further exacerbate her attentional issues.  The patient  should continue to address her psychiatric care appropriately.  The patient's past medical history has included a diagnosis of attention deficit hyperactivity disorder.  I would highly question this diagnosis given the patient's full history and I would suggest that early in life a primary central auditory processing deficit was playing a big role in difficulties in learning and achieving in academic settings.  The patient has anxiety and bipolar disorder which further exacerbate the attentional issues that she is having and now you add into that her multitude of concussive events and early stages of small vessel disease and these issues would explain her attentional deficits.  These deficits are significant but are not likely related to adult residual attention deficit disorder.  I will provide feedback to the patient regarding the results of the current neuropsychological evaluation.  We will also talk about various recommendations going forward.  Diagnosis:                               Axis I: Post concussion syndrome  Cognitive and neurobehavioral dysfunction  Mood disorder in conditions classified elsewhere  Obstructive sleep apnea syndrome   Ilean Skill, Psy.D. Neuropsychologist

## 2019-07-09 ENCOUNTER — Other Ambulatory Visit: Payer: Self-pay

## 2019-07-09 ENCOUNTER — Other Ambulatory Visit
Admission: RE | Admit: 2019-07-09 | Discharge: 2019-07-09 | Disposition: A | Discharge status: Home / Self Care | Payer: Federal, State, Local not specified - PPO | Source: Ambulatory Visit | Attending: Psychiatry | Admitting: Psychiatry

## 2019-07-09 ENCOUNTER — Encounter: Payer: Self-pay | Admitting: Anesthesiology

## 2019-07-09 ENCOUNTER — Encounter
Admission: RE | Admit: 2019-07-09 | Discharge: 2019-07-09 | Disposition: A | Discharge status: Home / Self Care | Payer: Federal, State, Local not specified - PPO | Source: Ambulatory Visit | Attending: Psychiatry | Admitting: Psychiatry

## 2019-07-09 DIAGNOSIS — E785 Hyperlipidemia, unspecified: Secondary | ICD-10-CM | POA: Diagnosis not present

## 2019-07-09 DIAGNOSIS — E1149 Type 2 diabetes mellitus with other diabetic neurological complication: Secondary | ICD-10-CM | POA: Diagnosis not present

## 2019-07-09 DIAGNOSIS — E1140 Type 2 diabetes mellitus with diabetic neuropathy, unspecified: Secondary | ICD-10-CM | POA: Diagnosis not present

## 2019-07-09 DIAGNOSIS — Z20828 Contact with and (suspected) exposure to other viral communicable diseases: Secondary | ICD-10-CM | POA: Diagnosis not present

## 2019-07-09 DIAGNOSIS — M1990 Unspecified osteoarthritis, unspecified site: Secondary | ICD-10-CM | POA: Diagnosis not present

## 2019-07-09 DIAGNOSIS — E1136 Type 2 diabetes mellitus with diabetic cataract: Secondary | ICD-10-CM | POA: Diagnosis not present

## 2019-07-09 DIAGNOSIS — Z01812 Encounter for preprocedural laboratory examination: Secondary | ICD-10-CM | POA: Insufficient documentation

## 2019-07-09 DIAGNOSIS — F418 Other specified anxiety disorders: Secondary | ICD-10-CM | POA: Diagnosis not present

## 2019-07-09 DIAGNOSIS — Z794 Long term (current) use of insulin: Secondary | ICD-10-CM | POA: Diagnosis not present

## 2019-07-09 DIAGNOSIS — I10 Essential (primary) hypertension: Secondary | ICD-10-CM | POA: Diagnosis not present

## 2019-07-09 DIAGNOSIS — F319 Bipolar disorder, unspecified: Secondary | ICD-10-CM | POA: Diagnosis not present

## 2019-07-09 DIAGNOSIS — R413 Other amnesia: Secondary | ICD-10-CM | POA: Diagnosis not present

## 2019-07-09 DIAGNOSIS — G4730 Sleep apnea, unspecified: Secondary | ICD-10-CM | POA: Diagnosis not present

## 2019-07-09 MED ORDER — MIDAZOLAM HCL 2 MG/2ML IJ SOLN
INTRAMUSCULAR | Status: DC | PRN
  Administered 2019-07-09: 2 mg via INTRAVENOUS

## 2019-07-09 MED ORDER — METHOHEXITAL SODIUM 100 MG/10ML IV SOSY
PREFILLED_SYRINGE | INTRAVENOUS | Status: DC | PRN
  Administered 2019-07-09: 70 mg via INTRAVENOUS

## 2019-07-09 MED ORDER — GLYCOPYRROLATE 0.2 MG/ML IJ SOLN
INTRAMUSCULAR | Status: AC
  Filled 2019-07-09: qty 1

## 2019-07-09 MED ORDER — KETOROLAC TROMETHAMINE 30 MG/ML IJ SOLN
15.0000 mg | Freq: Once | INTRAMUSCULAR | Status: AC
  Administered 2019-07-09: 09:00:00 15 mg via INTRAVENOUS

## 2019-07-09 MED ORDER — KETOROLAC TROMETHAMINE 30 MG/ML IJ SOLN
INTRAMUSCULAR | Status: AC
  Administered 2019-07-09: 15 mg via INTRAVENOUS
  Filled 2019-07-09: qty 1

## 2019-07-09 MED ORDER — SODIUM CHLORIDE 0.9 % IV SOLN
INTRAVENOUS | Status: DC | PRN
  Administered 2019-07-09: 10:00:00 via INTRAVENOUS

## 2019-07-09 MED ORDER — SODIUM CHLORIDE 0.9 % IV SOLN
500.0000 mL | Freq: Once | INTRAVENOUS | Status: AC
  Administered 2019-07-09: 500 mL via INTRAVENOUS

## 2019-07-09 MED ORDER — MIDAZOLAM HCL 2 MG/2ML IJ SOLN
INTRAMUSCULAR | Status: AC
  Filled 2019-07-09: qty 2

## 2019-07-09 MED ORDER — GLYCOPYRROLATE 0.2 MG/ML IJ SOLN
INTRAMUSCULAR | Status: DC | PRN
  Administered 2019-07-09: 0.2 mg via INTRAVENOUS

## 2019-07-09 MED ORDER — SUCCINYLCHOLINE CHLORIDE 20 MG/ML IJ SOLN
INTRAMUSCULAR | Status: DC | PRN
  Administered 2019-07-09: 80 mg via INTRAVENOUS

## 2019-07-09 NOTE — Discharge Instructions (Signed)
1)  The drugs that you have been given will stay in your system until tomorrow so for the       next 24 hours you should not:  A. Drive an automobile  B. Make any legal decisions  C. Drink any alcoholic beverages  2)  You may resume your regular meals upon return home.  3)  A responsible adult must take you home.  Someone should stay with you for a few          hours, then be available by phone for the remainder of the treatment day.  4)  You May experience any of the following symptoms:  Headache, Nausea and a dry mouth (due to the medications you were given),  temporary memory loss and some confusion, or sore muscles (a warm bath  should help this).  If you you experience any of these symptoms let us know on                your return visit.  5)  Report any of the following: any acute discomfort, severe headache, or temperature        greater than 100.5 F.   Also report any unusual redness, swelling, drainage, or pain         at your IV site.    You may report Symptoms to:  [place] [facility]          Phone: [phone], ECT Department           or Dr. Prescott Gum office [phone]  6)  Your next ECT Treatment is Friday November 13 at 8:30   We will call 2 days prior to your scheduled appointment for arrival times.  7)  Nothing to eat or drink after midnight the night before your procedure.  8)  Take    With a sip of water the morning of your procedure.  9)  Other Instructions: Call [phone] to cancel the morning of your procedure due         to illness or emergency.  10) We will call within 72 hours to assess how you are feeling.

## 2019-07-09 NOTE — Anesthesia Postprocedure Evaluation (Signed)
Anesthesia Post Note  Patient: Amy [name]  Procedure(s) Performed: ECT TX  Patient location during evaluation: PACU Anesthesia Type: General Level of consciousness: awake and alert Pain management: pain level controlled Vital Signs Assessment: post-procedure vital signs reviewed and stable Respiratory status: spontaneous breathing, nonlabored ventilation and respiratory function stable Cardiovascular status: blood pressure returned to baseline and stable Postop Assessment: no apparent nausea or vomiting Anesthetic complications: no     Last Vitals:  Vitals:   07/09/19 1105 07/09/19 1120  BP: 117/83 116/82  Pulse: 99 95  Resp: 16 18  Temp:  36.7 C  SpO2: 97%     Last Pain:  Vitals:   07/09/19 1120  TempSrc: Tympanic  PainSc: 0-No pain                 Durenda Hurt

## 2019-07-09 NOTE — Anesthesia Preprocedure Evaluation (Addendum)
Anesthesia Evaluation  Patient identified by MRN, date of birth, ID band Patient awake    Reviewed: Allergy & Precautions, H&P , NPO status , Patient's Chart, lab work & pertinent test results  Airway Mallampati: II  TM Distance: >3 FB Neck ROM: full    Dental  (+) Implants   Pulmonary neg shortness of breath, sleep apnea , neg COPD, neg recent URI, Not current smoker,           Cardiovascular hypertension, (-) angina(-) Cardiac Stents (-) dysrhythmias      Neuro/Psych PSYCHIATRIC DISORDERS Anxiety Depression Bipolar Disorder Diabetic neuropathy TIA   GI/Hepatic Neg liver ROS, GERD  Controlled,  Endo/Other  diabetes  Renal/GU negative Renal ROS  negative genitourinary   Musculoskeletal   Abdominal   Peds  Hematology negative hematology ROS (+)   Anesthesia Other Findings Past Medical History: No date: ADHD (attention deficit hyperactivity disorder) No date: Anemia No date: Anxiety No date: Bipolar disorder ([place]) 09/2014: Carpal tunnel syndrome of right wrist No date: Cataract, immature No date: Depression No date: Diabetic neuropathy ([facility])     Comment:  feet No date: Dyslipidemia No date: GERD (gastroesophageal reflux disease) No date: Hypertension     Comment:  states under control with med., has been on med. x 20               yrs. No date: Insulin dependent diabetes mellitus No date: Major depressive disorder No date: Osteoarthritis     Comment:  knees No date: SLEEP APNEA     Comment:  uses CPAP nightly No date: Vitamin D deficiency  Past Surgical History: No date: BREAST REDUCTION SURGERY; Bilateral 10/08/2014: CARPAL TUNNEL RELEASE; Right     Comment:  Procedure: RIGHT CARPAL TUNNEL RELEASE;  Surgeon: Daryll Brod, MD;  Location: [facility];                Service: Orthopedics;  Laterality: Right; 10/27/2013: COLONOSCOPY WITH PROPOFOL 02/03/2004:  ESOPHAGOGASTRODUODENOSCOPY 12/05/2005: LAPAROSCOPIC GASTRIC BANDING     Reproductive/Obstetrics negative OB ROS                            Anesthesia Physical Anesthesia Plan  ASA: III  Anesthesia Plan: General   Post-op Pain Management:    Induction:   PONV Risk Score and Plan:   Airway Management Planned: Mask  Additional Equipment:   Intra-op Plan:   Post-operative Plan:   Informed Consent: I have reviewed the patients History and Physical, chart, labs and discussed the procedure including the risks, benefits and alternatives for the proposed anesthesia with the patient or authorized representative who has indicated his/her understanding and acceptance.     Dental Advisory Given  Plan Discussed with: Anesthesiologist, CRNA and Surgeon  Anesthesia Plan Comments:         Anesthesia Quick Evaluation

## 2019-07-09 NOTE — Anesthesia Procedure Notes (Signed)
Performed by: Zonya Gudger, CRNA Pre-anesthesia Checklist: Patient identified, Emergency Drugs available, Suction available and Patient being monitored Patient Re-evaluated:Patient Re-evaluated prior to induction Oxygen Delivery Method: Circle system utilized Preoxygenation: Pre-oxygenation with 100% oxygen Induction Type: IV induction Ventilation: Mask ventilation without difficulty and Mask ventilation throughout procedure Airway Equipment and Method: Bite block Placement Confirmation: positive ETCO2 Dental Injury: Teeth and Oropharynx as per pre-operative assessment        

## 2019-07-09 NOTE — Transfer of Care (Signed)
Immediate Anesthesia Transfer of Care Note  Patient: Amy [name]  Procedure(s) Performed: ECT TX  Patient Location: PACU  Anesthesia Type:General  Level of Consciousness: sedated  Airway & Oxygen Therapy: Patient Spontanous Breathing and Patient connected to face mask oxygen  Post-op Assessment: Report given to RN and Post -op Vital signs reviewed and stable  Post vital signs: Reviewed and stable  Last Vitals:  Vitals Value Taken Time  BP 124/83 07/09/19 1035  Temp 36.1 C 07/09/19 1035  Pulse 96 07/09/19 1035  Resp 16 07/09/19 1035  SpO2 99 % 07/09/19 1035    Last Pain:  Vitals:   07/09/19 1000  TempSrc: (P) Tympanic  PainSc:          Complications: No apparent anesthesia complications

## 2019-07-09 NOTE — Anesthesia Post-op Follow-up Note (Signed)
Anesthesia QCDR form completed.        

## 2019-07-09 NOTE — Progress Notes (Signed)
Actual time 1034, recorded in wrong column

## 2019-07-10 ENCOUNTER — Other Ambulatory Visit: Payer: Self-pay | Admitting: Psychiatry

## 2019-07-10 DIAGNOSIS — E785 Hyperlipidemia, unspecified: Secondary | ICD-10-CM | POA: Diagnosis not present

## 2019-07-10 DIAGNOSIS — R7401 Elevation of levels of liver transaminase levels: Secondary | ICD-10-CM | POA: Diagnosis not present

## 2019-07-10 LAB — SARS CORONAVIRUS 2 (TAT 6-24 HRS): SARS Coronavirus 2: NEGATIVE

## 2019-07-11 ENCOUNTER — Other Ambulatory Visit: Payer: Self-pay | Admitting: Psychiatry

## 2019-07-11 ENCOUNTER — Other Ambulatory Visit: Payer: Self-pay

## 2019-07-11 ENCOUNTER — Encounter (HOSPITAL_BASED_OUTPATIENT_CLINIC_OR_DEPARTMENT_OTHER)
Admission: RE | Admit: 2019-07-11 | Discharge: 2019-07-11 | Disposition: A | Discharge status: Home / Self Care | Payer: Federal, State, Local not specified - PPO | Source: Ambulatory Visit | Attending: Psychiatry | Admitting: Psychiatry

## 2019-07-11 ENCOUNTER — Ambulatory Visit: Payer: Self-pay | Admitting: Certified Registered Nurse Anesthetist

## 2019-07-11 ENCOUNTER — Encounter: Payer: Self-pay | Admitting: Certified Registered Nurse Anesthetist

## 2019-07-11 DIAGNOSIS — E1136 Type 2 diabetes mellitus with diabetic cataract: Secondary | ICD-10-CM | POA: Diagnosis not present

## 2019-07-11 DIAGNOSIS — E785 Hyperlipidemia, unspecified: Secondary | ICD-10-CM | POA: Diagnosis not present

## 2019-07-11 DIAGNOSIS — F319 Bipolar disorder, unspecified: Secondary | ICD-10-CM | POA: Diagnosis not present

## 2019-07-11 DIAGNOSIS — F332 Major depressive disorder, recurrent severe without psychotic features: Secondary | ICD-10-CM

## 2019-07-11 DIAGNOSIS — R413 Other amnesia: Secondary | ICD-10-CM | POA: Diagnosis not present

## 2019-07-11 DIAGNOSIS — Z794 Long term (current) use of insulin: Secondary | ICD-10-CM | POA: Diagnosis not present

## 2019-07-11 DIAGNOSIS — E1140 Type 2 diabetes mellitus with diabetic neuropathy, unspecified: Secondary | ICD-10-CM | POA: Diagnosis not present

## 2019-07-11 DIAGNOSIS — M1990 Unspecified osteoarthritis, unspecified site: Secondary | ICD-10-CM | POA: Diagnosis not present

## 2019-07-11 DIAGNOSIS — F418 Other specified anxiety disorders: Secondary | ICD-10-CM | POA: Diagnosis not present

## 2019-07-11 DIAGNOSIS — G4730 Sleep apnea, unspecified: Secondary | ICD-10-CM | POA: Diagnosis not present

## 2019-07-11 DIAGNOSIS — I10 Essential (primary) hypertension: Secondary | ICD-10-CM | POA: Diagnosis not present

## 2019-07-11 LAB — GLUCOSE, CAPILLARY
Glucose-Capillary: 90 mg/dL (ref 70–99)
Glucose-Capillary: 82 mg/dL (ref 70–99)

## 2019-07-11 MED ORDER — MIDAZOLAM HCL 2 MG/2ML IJ SOLN
INTRAMUSCULAR | Status: AC
  Filled 2019-07-11: qty 2

## 2019-07-11 MED ORDER — KETOROLAC TROMETHAMINE 30 MG/ML IJ SOLN
INTRAMUSCULAR | Status: AC
  Administered 2019-07-11: 30 mg via INTRAVENOUS
  Filled 2019-07-11: qty 1

## 2019-07-11 MED ORDER — GLYCOPYRROLATE 0.2 MG/ML IJ SOLN
INTRAMUSCULAR | Status: AC
  Administered 2019-07-11: 0.2 mg via INTRAVENOUS
  Filled 2019-07-11: qty 1

## 2019-07-11 MED ORDER — SODIUM CHLORIDE 0.9 % IV SOLN
INTRAVENOUS | Status: DC | PRN
  Administered 2019-07-11: 10:00:00 via INTRAVENOUS

## 2019-07-11 MED ORDER — METHOHEXITAL SODIUM 100 MG/10ML IV SOSY
PREFILLED_SYRINGE | INTRAVENOUS | Status: DC | PRN
  Administered 2019-07-11: 70 mg via INTRAVENOUS

## 2019-07-11 MED ORDER — METHOHEXITAL SODIUM 0.5 G IJ SOLR
INTRAMUSCULAR | Status: AC
  Filled 2019-07-11: qty 500

## 2019-07-11 MED ORDER — SUCCINYLCHOLINE CHLORIDE 200 MG/10ML IV SOSY
PREFILLED_SYRINGE | INTRAVENOUS | Status: DC | PRN
  Administered 2019-07-11: 80 mg via INTRAVENOUS

## 2019-07-11 MED ORDER — SODIUM CHLORIDE 0.9 % IV SOLN
500.0000 mL | Freq: Once | INTRAVENOUS | Status: AC
  Administered 2019-07-11: 500 mL via INTRAVENOUS

## 2019-07-11 MED ORDER — GLYCOPYRROLATE 0.2 MG/ML IJ SOLN
0.2000 mg | Freq: Once | INTRAMUSCULAR | Status: AC
  Administered 2019-07-11: 10:00:00 0.2 mg via INTRAVENOUS

## 2019-07-11 MED ORDER — KETOROLAC TROMETHAMINE 30 MG/ML IJ SOLN
30.0000 mg | Freq: Once | INTRAMUSCULAR | Status: AC
  Administered 2019-07-11: 10:00:00 30 mg via INTRAVENOUS

## 2019-07-11 NOTE — Anesthesia Preprocedure Evaluation (Signed)
Anesthesia Evaluation  Patient identified by MRN, date of birth, ID band Patient awake    Reviewed: Allergy & Precautions, H&P , NPO status , Patient's Chart, lab work & pertinent test results  Airway Mallampati: II  TM Distance: >3 FB Neck ROM: full    Dental  (+) Implants   Pulmonary neg shortness of breath, sleep apnea , neg COPD, neg recent URI, Not current smoker,           Cardiovascular hypertension, (-) angina(-) Cardiac Stents (-) dysrhythmias      Neuro/Psych PSYCHIATRIC DISORDERS Anxiety Depression Bipolar Disorder Diabetic neuropathy TIA   GI/Hepatic Neg liver ROS, GERD  Controlled,  Endo/Other  diabetes  Renal/GU negative Renal ROS  negative genitourinary   Musculoskeletal   Abdominal   Peds  Hematology negative hematology ROS (+)   Anesthesia Other Findings Past Medical History: No date: ADHD (attention deficit hyperactivity disorder) No date: Anemia No date: Anxiety No date: Bipolar disorder ([place]) 09/2014: Carpal tunnel syndrome of right wrist No date: Cataract, immature No date: Depression No date: Diabetic neuropathy (HCC)     Comment:  feet No date: Dyslipidemia No date: GERD (gastroesophageal reflux disease) No date: Hypertension     Comment:  states under control with med., has been on med. x 20               yrs. No date: Insulin dependent diabetes mellitus No date: Major depressive disorder No date: Osteoarthritis     Comment:  knees No date: SLEEP APNEA     Comment:  uses CPAP nightly No date: Vitamin D deficiency  Past Surgical History: No date: BREAST REDUCTION SURGERY; Bilateral 10/08/2014: CARPAL TUNNEL RELEASE; Right     Comment:  Procedure: RIGHT CARPAL TUNNEL RELEASE;  Surgeon: Daryll Brod, MD;  Location: [facility];                Service: Orthopedics;  Laterality: Right; 10/27/2013: COLONOSCOPY WITH PROPOFOL 02/03/2004:  ESOPHAGOGASTRODUODENOSCOPY 12/05/2005: LAPAROSCOPIC GASTRIC BANDING     Reproductive/Obstetrics negative OB ROS                             Anesthesia Physical  Anesthesia Plan  ASA: III  Anesthesia Plan: General   Post-op Pain Management:    Induction:   PONV Risk Score and Plan:   Airway Management Planned: Mask  Additional Equipment:   Intra-op Plan:   Post-operative Plan:   Informed Consent: I have reviewed the patients History and Physical, chart, labs and discussed the procedure including the risks, benefits and alternatives for the proposed anesthesia with the patient or authorized representative who has indicated his/her understanding and acceptance.     Dental Advisory Given  Plan Discussed with: Anesthesiologist, CRNA and Surgeon  Anesthesia Plan Comments:         Anesthesia Quick Evaluation

## 2019-07-11 NOTE — Anesthesia Post-op Follow-up Note (Signed)
Anesthesia QCDR form completed.        

## 2019-07-11 NOTE — H&P (Signed)
Amy [name] is an 63 y.o. female.   Chief Complaint: slight memory impairment HPI: recurrent depression  Past Medical History:  Diagnosis Date  . ADHD (attention deficit hyperactivity disorder)   . Anemia   . Anxiety   . Bipolar disorder ([place])   . Carpal tunnel syndrome of right wrist 09/2014  . Cataract, immature   . Depression   . Diabetic neuropathy (HCC)    feet  . Dyslipidemia   . GERD (gastroesophageal reflux disease)   . Hypertension    states under control with med., has been on med. x 20 yrs.  . Insulin dependent diabetes mellitus   . Major depressive disorder   . Osteoarthritis    knees  . SLEEP APNEA    uses CPAP nightly  . Vitamin D deficiency     Past Surgical History:  Procedure Laterality Date  . BREAST REDUCTION SURGERY Bilateral   . CARPAL TUNNEL RELEASE Right 10/08/2014   Procedure: RIGHT CARPAL TUNNEL RELEASE;  Surgeon: Daryll Brod, MD;  Location: [facility];  Service: Orthopedics;  Laterality: Right;  . COLONOSCOPY WITH PROPOFOL  10/27/2013  . ESOPHAGOGASTRODUODENOSCOPY  02/03/2004  . LAPAROSCOPIC GASTRIC BANDING  12/05/2005    Family History  Problem Relation Age of Onset  . Heart disease Father   . Heart attack Father   . Breast cancer Sister   . Diabetes Sister   . Sarcoidosis Brother   . Cancer Brother        prostate  . Heart failure Mother   . Colon cancer Neg Hx   . Dementia Neg Hx    Social History:  reports that she has never smoked. She has never used smokeless tobacco. She reports that she does not drink alcohol or use drugs.  Allergies: No Known Allergies  (Not in a hospital admission)   Results for orders placed or performed during the hospital encounter of 07/11/19 (from the past 48 hour(s))  Glucose, capillary     Status: None   Collection Time: 07/11/19  9:26 AM  Result Value Ref Range   Glucose-Capillary 90 70 - 99 mg/dL   No results found.  Review of Systems  Constitutional: Negative.   HENT:  Negative.   Eyes: Negative.   Respiratory: Negative.   Cardiovascular: Negative.   Gastrointestinal: Negative.   Musculoskeletal: Negative.   Skin: Negative.   Neurological: Negative.   Psychiatric/Behavioral: Positive for depression and memory loss. Negative for hallucinations, substance abuse and suicidal ideas. The patient is not nervous/anxious and does not have insomnia.     Blood pressure (!) 141/77, pulse 67, temperature 98.1 F (36.7 C), temperature source Oral, resp. rate 18, SpO2 98 %. Physical Exam  Nursing note and vitals reviewed. Constitutional: She appears well-developed and well-nourished.  HENT:  Head: Normocephalic and atraumatic.  Eyes: Pupils are equal, round, and reactive to light. Conjunctivae are normal.  Neck: Normal range of motion.  Cardiovascular: Regular rhythm and normal heart sounds.  Respiratory: Effort normal. No respiratory distress.  GI: Soft.  Musculoskeletal: Normal range of motion.  Neurological: She is alert.  Skin: Skin is warm and dry.  Psychiatric: Judgment normal. Her affect is blunt. Her speech is delayed. She is slowed. She expresses no suicidal ideation. She exhibits abnormal recent memory.     Assessment/Plan Continue plan for index course  [name], MD 07/11/2019, 9:54 AM

## 2019-07-11 NOTE — Discharge Instructions (Signed)
1)  The drugs that you have been given will stay in your system until tomorrow so for the       next 24 hours you should not:  A. Drive an automobile  B. Make any legal decisions  C. Drink any alcoholic beverages  2)  You may resume your regular meals upon return home.  3)  A responsible adult must take you home.  Someone should stay with you for a few          hours, then be available by phone for the remainder of the treatment day.  4)  You May experience any of the following symptoms:  Headache, Nausea and a dry mouth (due to the medications you were given),  temporary memory loss and some confusion, or sore muscles (a warm bath  should help this).  If you you experience any of these symptoms let us know on                your return visit.  5)  Report any of the following: any acute discomfort, severe headache, or temperature        greater than 100.5 F.   Also report any unusual redness, swelling, drainage, or pain         at your IV site.    You may report Symptoms to:  [place] at [facility]          Phone: [phone], ECT Department           or Dr. Prescott Gum office [phone]  6)  Your next ECT Treatment is Monday November 16 at 8:00  We will call 2 days prior to your scheduled appointment for arrival times.  7)  Nothing to eat or drink after midnight the night before your procedure.  8)  Take morning with a sip of water the morning of your procedure.  9)  Other Instructions: Call [phone] to cancel the morning of your procedure due         to illness or emergency.  10) We will call within 72 hours to assess how you are feeling.

## 2019-07-11 NOTE — Procedures (Signed)
ECT SERVICES Physician's Interval Evaluation & Treatment Note  Patient Identification: Amy [name] MRN:  [id number] Date of Evaluation:  07/11/2019 TX #: 2  MADRS:   MMSE:   P.E. Findings:  No change physical exam  Psychiatric Interval Note:  Minor memory complaints no other changes mention  Subjective:  Patient is a 63 y.o. female seen for evaluation for Electroconvulsive Therapy. Ongoing major depression  Treatment Summary:   [x]   Right Unilateral             []  Bilateral   % Energy : 0.3 ms 60%   Impedance: 2120 ohms  Seizure Energy Index: 7866 V squared  Postictal Suppression Index: 78%  Seizure Concordance Index: 94%  Medications  Pre Shock: Robinul 0.2 mg Toradol 30 mg Brevital 70 mg succinylcholine 80 mg  Post Shock:    Seizure Duration: 67 seconds EMG 93 seconds EEG   Comments: Continue right  unilateral index course into next week  Lungs:  [x]   Clear to auscultation               []  Other:   Heart:    [x]   Regular rhythm             []  irregular rhythm    [x]   Previous H&P reviewed, patient examined and there are NO CHANGES                 []   Previous H&P reviewed, patient examined and there are changes noted.   [name], MD 11/13/202010:25 AM

## 2019-07-11 NOTE — Anesthesia Postprocedure Evaluation (Signed)
Anesthesia Post Note  Patient: Amy [name]  Procedure(s) Performed: ECT TX  Patient location during evaluation: PACU Anesthesia Type: General Level of consciousness: awake and alert Pain management: pain level controlled Vital Signs Assessment: post-procedure vital signs reviewed and stable Respiratory status: spontaneous breathing, nonlabored ventilation, respiratory function stable and patient connected to nasal cannula oxygen Cardiovascular status: blood pressure returned to baseline and stable Postop Assessment: no apparent nausea or vomiting Anesthetic complications: no     Last Vitals:  Vitals:   07/11/19 1101 07/11/19 1145  BP: 137/84 130/80  Pulse: 78 72  Resp: 14 18  Temp: 37 C 36.9 C  SpO2: 98%     Last Pain:  Vitals:   07/11/19 1145  TempSrc: Oral  PainSc: 0-No pain                 Martha Clan

## 2019-07-11 NOTE — Transfer of Care (Signed)
Immediate Anesthesia Transfer of Care Note  Patient: Amy [name]  Procedure(s) Performed: ECT TX  Patient Location: PACU  Anesthesia Type:General  Level of Consciousness: drowsy and patient cooperative  Airway & Oxygen Therapy: Patient Spontanous Breathing and Patient connected to face mask oxygen  Post-op Assessment: Report given to RN and Post -op Vital signs reviewed and stable  Post vital signs: Reviewed and stable  Last Vitals:  Vitals Value Taken Time  BP 144/98 07/11/19 1031  Temp 37 C 07/11/19 1030  Pulse 92 07/11/19 1031  Resp 17 07/11/19 1031  SpO2 98 % 07/11/19 1031  Vitals shown include unvalidated device data.  Last Pain:  Vitals:   07/11/19 0914  TempSrc:   PainSc: 0-No pain         Complications: No apparent anesthesia complications

## 2019-07-14 ENCOUNTER — Encounter (HOSPITAL_BASED_OUTPATIENT_CLINIC_OR_DEPARTMENT_OTHER)
Admission: RE | Admit: 2019-07-14 | Discharge: 2019-07-14 | Disposition: A | Discharge status: Home / Self Care | Payer: Federal, State, Local not specified - PPO | Source: Ambulatory Visit | Attending: Psychiatry | Admitting: Psychiatry

## 2019-07-14 ENCOUNTER — Encounter: Payer: Self-pay | Admitting: Certified Registered Nurse Anesthetist

## 2019-07-14 ENCOUNTER — Other Ambulatory Visit: Payer: Self-pay

## 2019-07-14 ENCOUNTER — Ambulatory Visit: Payer: Self-pay | Admitting: Certified Registered Nurse Anesthetist

## 2019-07-14 DIAGNOSIS — Z794 Long term (current) use of insulin: Secondary | ICD-10-CM | POA: Diagnosis not present

## 2019-07-14 DIAGNOSIS — F418 Other specified anxiety disorders: Secondary | ICD-10-CM | POA: Diagnosis not present

## 2019-07-14 DIAGNOSIS — K219 Gastro-esophageal reflux disease without esophagitis: Secondary | ICD-10-CM | POA: Diagnosis not present

## 2019-07-14 DIAGNOSIS — F332 Major depressive disorder, recurrent severe without psychotic features: Secondary | ICD-10-CM

## 2019-07-14 DIAGNOSIS — G4730 Sleep apnea, unspecified: Secondary | ICD-10-CM | POA: Diagnosis not present

## 2019-07-14 DIAGNOSIS — E1140 Type 2 diabetes mellitus with diabetic neuropathy, unspecified: Secondary | ICD-10-CM | POA: Diagnosis not present

## 2019-07-14 DIAGNOSIS — I10 Essential (primary) hypertension: Secondary | ICD-10-CM | POA: Diagnosis not present

## 2019-07-14 DIAGNOSIS — R413 Other amnesia: Secondary | ICD-10-CM | POA: Diagnosis not present

## 2019-07-14 DIAGNOSIS — E1136 Type 2 diabetes mellitus with diabetic cataract: Secondary | ICD-10-CM | POA: Diagnosis not present

## 2019-07-14 DIAGNOSIS — M1990 Unspecified osteoarthritis, unspecified site: Secondary | ICD-10-CM | POA: Diagnosis not present

## 2019-07-14 DIAGNOSIS — E785 Hyperlipidemia, unspecified: Secondary | ICD-10-CM | POA: Diagnosis not present

## 2019-07-14 DIAGNOSIS — F319 Bipolar disorder, unspecified: Secondary | ICD-10-CM | POA: Diagnosis not present

## 2019-07-14 LAB — GLUCOSE, CAPILLARY: Glucose-Capillary: 93 mg/dL (ref 70–99)

## 2019-07-14 MED ORDER — GLYCOPYRROLATE 0.2 MG/ML IJ SOLN
0.1000 mg | Freq: Once | INTRAMUSCULAR | Status: AC
  Administered 2019-07-14: 0.1 mg via INTRAVENOUS

## 2019-07-14 MED ORDER — SODIUM CHLORIDE 0.9 % IV SOLN
500.0000 mL | Freq: Once | INTRAVENOUS | Status: AC
  Administered 2019-07-14: 500 mL via INTRAVENOUS

## 2019-07-14 MED ORDER — KETOROLAC TROMETHAMINE 30 MG/ML IJ SOLN
30.0000 mg | Freq: Once | INTRAMUSCULAR | Status: AC
  Administered 2019-07-14: 09:00:00 30 mg via INTRAVENOUS

## 2019-07-14 MED ORDER — KETOROLAC TROMETHAMINE 30 MG/ML IJ SOLN
INTRAMUSCULAR | Status: AC
  Administered 2019-07-14: 30 mg via INTRAVENOUS
  Filled 2019-07-14: qty 1

## 2019-07-14 MED ORDER — ONDANSETRON HCL 4 MG/2ML IJ SOLN: 4.0000 mg | Freq: Once | INTRAMUSCULAR | Status: DC | PRN

## 2019-07-14 MED ORDER — METHOHEXITAL SODIUM 0.5 G IJ SOLR
INTRAMUSCULAR | Status: AC
  Filled 2019-07-14: qty 500

## 2019-07-14 MED ORDER — SUCCINYLCHOLINE CHLORIDE 200 MG/10ML IV SOSY
PREFILLED_SYRINGE | INTRAVENOUS | Status: DC | PRN
  Administered 2019-07-14: 80 mg via INTRAVENOUS

## 2019-07-14 MED ORDER — MIDAZOLAM HCL 2 MG/2ML IJ SOLN
INTRAMUSCULAR | Status: AC
  Filled 2019-07-14: qty 2

## 2019-07-14 MED ORDER — FENTANYL CITRATE (PF) 100 MCG/2ML IJ SOLN: 25.0000 ug | INTRAMUSCULAR | Status: DC | PRN

## 2019-07-14 MED ORDER — SODIUM CHLORIDE 0.9 % IV SOLN
INTRAVENOUS | Status: DC | PRN
  Administered 2019-07-14: 10:00:00 via INTRAVENOUS

## 2019-07-14 MED ORDER — GLYCOPYRROLATE 0.2 MG/ML IJ SOLN
INTRAMUSCULAR | Status: AC
  Filled 2019-07-14: qty 1

## 2019-07-14 MED ORDER — MIDAZOLAM HCL 2 MG/2ML IJ SOLN
INTRAMUSCULAR | Status: DC | PRN
  Administered 2019-07-14: 2 mg via INTRAVENOUS

## 2019-07-14 MED ORDER — METHOHEXITAL SODIUM 100 MG/10ML IV SOSY
PREFILLED_SYRINGE | INTRAVENOUS | Status: DC | PRN
  Administered 2019-07-14: 70 mg via INTRAVENOUS

## 2019-07-14 NOTE — Anesthesia Procedure Notes (Signed)
Date/Time: 07/14/2019 10:18 AM Performed by: Caryl Asp, CRNA Pre-anesthesia Checklist: Patient identified, Emergency Drugs available, Suction available and Patient being monitored Patient Re-evaluated:Patient Re-evaluated prior to induction Oxygen Delivery Method: Circle system utilized Preoxygenation: Pre-oxygenation with 100% oxygen Induction Type: IV induction Ventilation: Mask ventilation without difficulty and Mask ventilation throughout procedure Airway Equipment and Method: Bite block Placement Confirmation: positive ETCO2 Dental Injury: Teeth and Oropharynx as per pre-operative assessment

## 2019-07-14 NOTE — Anesthesia Postprocedure Evaluation (Signed)
Anesthesia Post Note  Patient: Amy [name]  Procedure(s) Performed: ECT TX  Patient location during evaluation: PACU Anesthesia Type: General Level of consciousness: awake and alert and oriented Pain management: pain level controlled Vital Signs Assessment: post-procedure vital signs reviewed and stable Respiratory status: spontaneous breathing Cardiovascular status: blood pressure returned to baseline Anesthetic complications: no     Last Vitals:  Vitals:   07/14/19 1114 07/14/19 1128  BP: 122/71 128/75  Pulse: 98 85  Resp: 20 18  Temp: (!) 36.2 C 36.6 C  SpO2: 98%     Last Pain:  Vitals:   07/14/19 1128  TempSrc: Oral  PainSc:                  Pascual Mantel

## 2019-07-14 NOTE — Discharge Instructions (Signed)
1)  The drugs that you have been given will stay in your system until tomorrow so for the       next 24 hours you should not:  A. Drive an automobile  B. Make any legal decisions  C. Drink any alcoholic beverages  2)  You may resume your regular meals upon return home.  3)  A responsible adult must take you home.  Someone should stay with you for a few          hours, then be available by phone for the remainder of the treatment day.  4)  You May experience any of the following symptoms:  Headache, Nausea and a dry mouth (due to the medications you were given),  temporary memory loss and some confusion, or sore muscles (a warm bath  should help this).  If you you experience any of these symptoms let us know on                your return visit.  5)  Report any of the following: any acute discomfort, severe headache, or temperature        greater than 100.5 F.   Also report any unusual redness, swelling, drainage, or pain         at your IV site.    You may report Symptoms to:  [place] at [facility]          Phone: [phone], ECT Department           or Dr. Prescott Gum office [phone]  6)  Your next ECT Treatment is Wednesday November 18 at 8:00  We will call 2 days prior to your scheduled appointment for arrival times.  7)  Nothing to eat or drink after midnight the night before your procedure.  8)  Take     With a sip of water the morning of your procedure.  9)  Other Instructions: Call [phone] to cancel the morning of your procedure due         to illness or emergency.  10) We will call within 72 hours to assess how you are feeling.

## 2019-07-14 NOTE — H&P (Signed)
Amy [name] is an 62 y.o. female.   Chief Complaint: feeling better  HPI: recurrent depression  Past Medical History:  Diagnosis Date  . ADHD (attention deficit hyperactivity disorder)   . Anemia   . Anxiety   . Bipolar disorder ([place])   . Carpal tunnel syndrome of right wrist 09/2014  . Cataract, immature   . Depression   . Diabetic neuropathy (HCC)    feet  . Dyslipidemia   . GERD (gastroesophageal reflux disease)   . Hypertension    states under control with med., has been on med. x 20 yrs.  . Insulin dependent diabetes mellitus   . Major depressive disorder   . Osteoarthritis    knees  . SLEEP APNEA    uses CPAP nightly  . Vitamin D deficiency     Past Surgical History:  Procedure Laterality Date  . BREAST REDUCTION SURGERY Bilateral   . CARPAL TUNNEL RELEASE Right 10/08/2014   Procedure: RIGHT CARPAL TUNNEL RELEASE;  Surgeon: Daryll Brod, MD;  Location: [place];  Service: Orthopedics;  Laterality: Right;  . COLONOSCOPY WITH PROPOFOL  10/27/2013  . ESOPHAGOGASTRODUODENOSCOPY  02/03/2004  . LAPAROSCOPIC GASTRIC BANDING  12/05/2005    Family History  Problem Relation Age of Onset  . Heart disease Father   . Heart attack Father   . Breast cancer Sister   . Diabetes Sister   . Sarcoidosis Brother   . Cancer Brother        prostate  . Heart failure Mother   . Colon cancer Neg Hx   . Dementia Neg Hx    Social History:  reports that she has never smoked. She has never used smokeless tobacco. She reports that she does not drink alcohol or use drugs.  Allergies: No Known Allergies  (Not in a hospital admission)   No results found for this or any previous visit (from the past 48 hour(s)). No results found.  Review of Systems  Constitutional: Negative.   HENT: Negative.   Eyes: Negative.   Respiratory: Negative.   Cardiovascular: Negative.   Gastrointestinal: Negative.   Musculoskeletal: Negative.   Skin: Negative.   Neurological: Negative.    Psychiatric/Behavioral: Positive for memory loss. Negative for depression, hallucinations, substance abuse and suicidal ideas. The patient is not nervous/anxious and does not have insomnia.     Blood pressure 111/79, pulse 66, temperature 98.2 F (36.8 C), temperature source Oral, resp. rate 18, SpO2 100 %. Physical Exam  Nursing note and vitals reviewed. Constitutional: She appears well-developed and well-nourished.  HENT:  Head: Normocephalic and atraumatic.  Eyes: Pupils are equal, round, and reactive to light. Conjunctivae are normal.  Neck: Normal range of motion.  Cardiovascular: Regular rhythm and normal heart sounds.  Respiratory: Effort normal. No respiratory distress.  GI: Soft.  Musculoskeletal: Normal range of motion.  Neurological: She is alert.  Skin: Skin is warm and dry.  Psychiatric: Judgment normal. Her affect is blunt. Her speech is delayed. She is slowed. Thought content is not paranoid. She expresses no homicidal and no suicidal ideation. She exhibits abnormal recent memory.     Assessment/Plan Continue index  [name], MD 07/14/2019, 10:04 AM

## 2019-07-14 NOTE — Procedures (Signed)
ECT SERVICES Physician's Interval Evaluation & Treatment Note  Patient Identification: Amy [name] MRN:  [id number] Date of Evaluation:  07/14/2019 TX #: 3  MADRS:   MMSE:   P.E. Findings:  No change physical exam  Psychiatric Interval Note:  Mood reported as being a little better or "lighter"  Subjective:  Patient is a 63 y.o. female seen for evaluation for Electroconvulsive Therapy. Feels a little bit better some memory impairment  Treatment Summary:   [x]   Right Unilateral             []  Bilateral   % Energy : 0.3 ms 60%   Impedance: 2570 ohms  Seizure Energy Index: For some reason computer did not read although it was a very normal looking seizure  Postictal Suppression Index: See above  Seizure Concordance Index: 84%  Medications  Pre Shock: Robinul 0.2 mg Toradol 30 mg Brevital 70 mg succinylcholine 80 mg  Post Shock:    Seizure Duration: 47 seconds EMG 70 seconds EEG   Comments: Follow-up Wednesday and continue index course  Lungs:  [x]   Clear to auscultation               []  Other:   Heart:    [x]   Regular rhythm             []  irregular rhythm    [x]   Previous H&P reviewed, patient examined and there are NO CHANGES                 []   Previous H&P reviewed, patient examined and there are changes noted.   [name], MD 11/16/202010:11 AM

## 2019-07-14 NOTE — Anesthesia Post-op Follow-up Note (Signed)
Anesthesia QCDR form completed.        

## 2019-07-14 NOTE — Anesthesia Preprocedure Evaluation (Signed)
Anesthesia Evaluation  Patient identified by MRN, date of birth, ID band Patient awake    Reviewed: Allergy & Precautions, H&P , NPO status , Patient's Chart, lab work & pertinent test results  Airway Mallampati: II  TM Distance: >3 FB Neck ROM: full    Dental  (+) Implants   Pulmonary neg shortness of breath, sleep apnea , neg COPD, neg recent URI, Not current smoker,           Cardiovascular hypertension, (-) angina(-) Cardiac Stents (-) dysrhythmias      Neuro/Psych PSYCHIATRIC DISORDERS Anxiety Depression Bipolar Disorder Diabetic neuropathy TIA   GI/Hepatic Neg liver ROS, GERD  Controlled,  Endo/Other  diabetes  Renal/GU negative Renal ROS  negative genitourinary   Musculoskeletal   Abdominal   Peds  Hematology negative hematology ROS (+)   Anesthesia Other Findings Past Medical History: No date: ADHD (attention deficit hyperactivity disorder) No date: Anemia No date: Anxiety No date: Bipolar disorder ([place]) 09/2014: Carpal tunnel syndrome of right wrist No date: Cataract, immature No date: Depression No date: Diabetic neuropathy (HCC)     Comment:  feet No date: Dyslipidemia No date: GERD (gastroesophageal reflux disease) No date: Hypertension     Comment:  states under control with med., has been on med. x 20               yrs. No date: Insulin dependent diabetes mellitus No date: Major depressive disorder No date: Osteoarthritis     Comment:  knees No date: SLEEP APNEA     Comment:  uses CPAP nightly No date: Vitamin D deficiency  Past Surgical History: No date: BREAST REDUCTION SURGERY; Bilateral 10/08/2014: CARPAL TUNNEL RELEASE; Right     Comment:  Procedure: RIGHT CARPAL TUNNEL RELEASE;  Surgeon: Daryll Brod, MD;  Location: [facility];                Service: Orthopedics;  Laterality: Right; 10/27/2013: COLONOSCOPY WITH PROPOFOL 02/03/2004:  ESOPHAGOGASTRODUODENOSCOPY 12/05/2005: LAPAROSCOPIC GASTRIC BANDING     Reproductive/Obstetrics negative OB ROS                             Anesthesia Physical  Anesthesia Plan  ASA: III  Anesthesia Plan: General   Post-op Pain Management:    Induction:   PONV Risk Score and Plan:   Airway Management Planned: Mask  Additional Equipment:   Intra-op Plan:   Post-operative Plan:   Informed Consent: I have reviewed the patients History and Physical, chart, labs and discussed the procedure including the risks, benefits and alternatives for the proposed anesthesia with the patient or authorized representative who has indicated his/her understanding and acceptance.     Dental Advisory Given  Plan Discussed with: Anesthesiologist, CRNA and Surgeon  Anesthesia Plan Comments:         Anesthesia Quick Evaluation

## 2019-07-14 NOTE — Transfer of Care (Signed)
Immediate Anesthesia Transfer of Care Note  Patient: Amy [name]  Procedure(s) Performed: ECT TX  Patient Location: PACU  Anesthesia Type:General  Level of Consciousness: sedated  Airway & Oxygen Therapy: Patient Spontanous Breathing and Patient connected to face mask oxygen  Post-op Assessment: Report given to RN and Post -op Vital signs reviewed and stable  Post vital signs: Reviewed and stable  Last Vitals:  Vitals Value Taken Time  BP    Temp    Pulse    Resp    SpO2      Last Pain:  Vitals:   07/14/19 1030  TempSrc:   PainSc: (P) 0-No pain         Complications: No apparent anesthesia complications

## 2019-07-15 ENCOUNTER — Other Ambulatory Visit: Payer: Self-pay | Admitting: Psychiatry

## 2019-07-16 ENCOUNTER — Encounter (HOSPITAL_BASED_OUTPATIENT_CLINIC_OR_DEPARTMENT_OTHER)
Admission: RE | Admit: 2019-07-16 | Discharge: 2019-07-16 | Disposition: A | Discharge status: Home / Self Care | Payer: Federal, State, Local not specified - PPO | Source: Ambulatory Visit | Attending: Psychiatry | Admitting: Psychiatry

## 2019-07-16 ENCOUNTER — Other Ambulatory Visit: Payer: Self-pay

## 2019-07-16 ENCOUNTER — Encounter: Payer: Self-pay | Admitting: Anesthesiology

## 2019-07-16 ENCOUNTER — Ambulatory Visit: Payer: Self-pay | Admitting: Anesthesiology

## 2019-07-16 ENCOUNTER — Other Ambulatory Visit
Admission: RE | Admit: 2019-07-16 | Discharge: 2019-07-16 | Disposition: A | Discharge status: Home / Self Care | Payer: Federal, State, Local not specified - PPO | Source: Ambulatory Visit | Attending: Psychiatry | Admitting: Psychiatry

## 2019-07-16 DIAGNOSIS — Z01812 Encounter for preprocedural laboratory examination: Secondary | ICD-10-CM | POA: Diagnosis not present

## 2019-07-16 DIAGNOSIS — F332 Major depressive disorder, recurrent severe without psychotic features: Secondary | ICD-10-CM | POA: Diagnosis not present

## 2019-07-16 DIAGNOSIS — R413 Other amnesia: Secondary | ICD-10-CM | POA: Diagnosis not present

## 2019-07-16 DIAGNOSIS — E1136 Type 2 diabetes mellitus with diabetic cataract: Secondary | ICD-10-CM | POA: Diagnosis not present

## 2019-07-16 DIAGNOSIS — G4730 Sleep apnea, unspecified: Secondary | ICD-10-CM | POA: Diagnosis not present

## 2019-07-16 DIAGNOSIS — F418 Other specified anxiety disorders: Secondary | ICD-10-CM | POA: Diagnosis not present

## 2019-07-16 DIAGNOSIS — E1140 Type 2 diabetes mellitus with diabetic neuropathy, unspecified: Secondary | ICD-10-CM | POA: Diagnosis not present

## 2019-07-16 DIAGNOSIS — Z20828 Contact with and (suspected) exposure to other viral communicable diseases: Secondary | ICD-10-CM | POA: Diagnosis not present

## 2019-07-16 DIAGNOSIS — I10 Essential (primary) hypertension: Secondary | ICD-10-CM | POA: Diagnosis not present

## 2019-07-16 DIAGNOSIS — E785 Hyperlipidemia, unspecified: Secondary | ICD-10-CM | POA: Diagnosis not present

## 2019-07-16 DIAGNOSIS — F319 Bipolar disorder, unspecified: Secondary | ICD-10-CM | POA: Diagnosis not present

## 2019-07-16 DIAGNOSIS — M1990 Unspecified osteoarthritis, unspecified site: Secondary | ICD-10-CM | POA: Diagnosis not present

## 2019-07-16 DIAGNOSIS — K219 Gastro-esophageal reflux disease without esophagitis: Secondary | ICD-10-CM | POA: Diagnosis not present

## 2019-07-16 DIAGNOSIS — Z794 Long term (current) use of insulin: Secondary | ICD-10-CM | POA: Diagnosis not present

## 2019-07-16 LAB — GLUCOSE, CAPILLARY
Glucose-Capillary: 148 mg/dL — ABNORMAL HIGH (ref 70–99)
Glucose-Capillary: 141 mg/dL — ABNORMAL HIGH (ref 70–99)

## 2019-07-16 LAB — SARS CORONAVIRUS 2 (TAT 6-24 HRS): SARS Coronavirus 2: NEGATIVE

## 2019-07-16 MED ORDER — GLYCOPYRROLATE 0.2 MG/ML IJ SOLN: 0.1000 mg | Freq: Once | INTRAMUSCULAR | Status: DC

## 2019-07-16 MED ORDER — MIDAZOLAM HCL 2 MG/2ML IJ SOLN
INTRAMUSCULAR | Status: AC
  Filled 2019-07-16: qty 2

## 2019-07-16 MED ORDER — LABETALOL HCL 5 MG/ML IV SOLN
INTRAVENOUS | Status: DC | PRN
  Administered 2019-07-16: 10 mg via INTRAVENOUS

## 2019-07-16 MED ORDER — METHOHEXITAL SODIUM 100 MG/10ML IV SOSY
PREFILLED_SYRINGE | INTRAVENOUS | Status: DC | PRN
  Administered 2019-07-16: 70 mg via INTRAVENOUS

## 2019-07-16 MED ORDER — SUCCINYLCHOLINE CHLORIDE 20 MG/ML IJ SOLN
INTRAMUSCULAR | Status: AC
  Filled 2019-07-16: qty 1

## 2019-07-16 MED ORDER — KETOROLAC TROMETHAMINE 30 MG/ML IJ SOLN: 30.0000 mg | Freq: Once | INTRAMUSCULAR | Status: DC

## 2019-07-16 MED ORDER — SODIUM CHLORIDE 0.9 % IV SOLN
500.0000 mL | Freq: Once | INTRAVENOUS | Status: AC
  Administered 2019-07-16: 10:00:00 via INTRAVENOUS

## 2019-07-16 MED ORDER — METHOHEXITAL SODIUM 0.5 G IJ SOLR
INTRAMUSCULAR | Status: AC
  Filled 2019-07-16: qty 500

## 2019-07-16 MED ORDER — SUCCINYLCHOLINE CHLORIDE 20 MG/ML IJ SOLN
INTRAMUSCULAR | Status: DC | PRN
  Administered 2019-07-16: 80 mg via INTRAVENOUS

## 2019-07-16 MED ORDER — KETOROLAC TROMETHAMINE 30 MG/ML IJ SOLN
INTRAMUSCULAR | Status: AC
  Administered 2019-07-16: 30 mg
  Filled 2019-07-16: qty 1

## 2019-07-16 MED ORDER — GLYCOPYRROLATE 0.2 MG/ML IJ SOLN
INTRAMUSCULAR | Status: AC
  Administered 2019-07-16: 0.2 mg
  Filled 2019-07-16: qty 1

## 2019-07-16 MED ORDER — MIDAZOLAM HCL 5 MG/5ML IJ SOLN
INTRAMUSCULAR | Status: DC | PRN
  Administered 2019-07-16: 2 mg via INTRAVENOUS

## 2019-07-16 NOTE — Anesthesia Post-op Follow-up Note (Signed)
Anesthesia QCDR form completed.        

## 2019-07-16 NOTE — Procedures (Signed)
ECT SERVICES Physician's Interval Evaluation & Treatment Note  Patient Identification: Amy [name] MRN:  [id number] Date of Evaluation:  07/16/2019 TX #: 4  MADRS:   MMSE:   P.E. Findings:  No change physical exam  Psychiatric Interval Note:  Mood appears to be better [place] maybe not baseline noticing some memory problems  Subjective:  Patient is a 63 y.o. female seen for evaluation for Electroconvulsive Therapy. Notices some memory problems but also a little more alert  Treatment Summary:   [x]   Right Unilateral             []  Bilateral   % Energy : 0.3 ms 60%   Impedance: 2500 ohms  Seizure Energy Index: 8115 V squared  Postictal Suppression Index: 64%  Seizure Concordance Index: 96%  Medications  Pre Shock: Robinul 0.2 mg Toradol 30 mg Brevital 70 mg succinylcholine 80 mg  Post Shock: Versed 2 mg  Seizure Duration: 59 seconds EEG MG 121 seconds EEG   Comments: We will going to go ahead and do treatment on Friday which will be #5 and that we will complete the series for now because we will have Thanksgiving break after that  Lungs:  [x]   Clear to auscultation               []  Other:   Heart:    [x]   Regular rhythm             []  irregular rhythm    [x]   Previous H&P reviewed, patient examined and there are NO CHANGES                 []   Previous H&P reviewed, patient examined and there are changes noted.   [name], MD 11/18/202010:18 AM

## 2019-07-16 NOTE — Anesthesia Preprocedure Evaluation (Signed)
Anesthesia Evaluation  Patient identified by MRN, date of birth, ID band Patient awake    Reviewed: Allergy & Precautions, H&P , NPO status , Patient's Chart, lab work & pertinent test results  Airway Mallampati: II  TM Distance: >3 FB Neck ROM: full    Dental  (+) Implants   Pulmonary neg shortness of breath, sleep apnea , neg COPD, neg recent URI, Not current smoker,           Cardiovascular hypertension, (-) angina(-) Cardiac Stents (-) dysrhythmias      Neuro/Psych PSYCHIATRIC DISORDERS Anxiety Depression Bipolar Disorder Diabetic neuropathy TIA   GI/Hepatic Neg liver ROS, GERD  Controlled,  Endo/Other  diabetes  Renal/GU negative Renal ROS  negative genitourinary   Musculoskeletal   Abdominal   Peds  Hematology negative hematology ROS (+)   Anesthesia Other Findings Past Medical History: No date: ADHD (attention deficit hyperactivity disorder) No date: Anemia No date: Anxiety No date: Bipolar disorder ([place]) 09/2014: Carpal tunnel syndrome of right wrist No date: Cataract, immature No date: Depression No date: Diabetic neuropathy (HCC)     Comment:  feet No date: Dyslipidemia No date: GERD (gastroesophageal reflux disease) No date: Hypertension     Comment:  states under control with med., has been on med. x 20               yrs. No date: Insulin dependent diabetes mellitus No date: Major depressive disorder No date: Osteoarthritis     Comment:  knees No date: SLEEP APNEA     Comment:  uses CPAP nightly No date: Vitamin D deficiency  Past Surgical History: No date: BREAST REDUCTION SURGERY; Bilateral 10/08/2014: CARPAL TUNNEL RELEASE; Right     Comment:  Procedure: RIGHT CARPAL TUNNEL RELEASE;  Surgeon: Daryll Brod, MD;  Location: [facility];                Service: Orthopedics;  Laterality: Right; 10/27/2013: COLONOSCOPY WITH PROPOFOL 02/03/2004:  ESOPHAGOGASTRODUODENOSCOPY 12/05/2005: LAPAROSCOPIC GASTRIC BANDING     Reproductive/Obstetrics negative OB ROS                             Anesthesia Physical  Anesthesia Plan  ASA: III  Anesthesia Plan: General   Post-op Pain Management:    Induction:   PONV Risk Score and Plan:   Airway Management Planned: Mask  Additional Equipment:   Intra-op Plan:   Post-operative Plan:   Informed Consent: I have reviewed the patients History and Physical, chart, labs and discussed the procedure including the risks, benefits and alternatives for the proposed anesthesia with the patient or authorized representative who has indicated his/her understanding and acceptance.     Dental Advisory Given  Plan Discussed with: Anesthesiologist, CRNA and Surgeon  Anesthesia Plan Comments:         Anesthesia Quick Evaluation

## 2019-07-16 NOTE — Discharge Instructions (Signed)
1)  The drugs that you have been given will stay in your system until tomorrow so for the       next 24 hours you should not:  A. Drive an automobile  B. Make any legal decisions  C. Drink any alcoholic beverages  2)  You may resume your regular meals upon return home.  3)  A responsible adult must take you home.  Someone should stay with you for a few          hours, then be available by phone for the remainder of the treatment day.  4)  You May experience any of the following symptoms:  Headache, Nausea and a dry mouth (due to the medications you were given),  temporary memory loss and some confusion, or sore muscles (a warm bath  should help this).  If you you experience any of these symptoms let us know on                your return visit.  5)  Report any of the following: any acute discomfort, severe headache, or temperature        greater than 100.5 F.   Also report any unusual redness, swelling, drainage, or pain         at your IV site.    You may report Symptoms to:  [place] at [facility]          Phone: [phone], ECT Department           or Dr. Prescott Gum office [phone]  6)  Your next ECT Treatment is Friday November 20 at 8:00  We will call 2 days prior to your scheduled appointment for arrival times.  7)  Nothing to eat or drink after midnight the night before your procedure.  8)  Take     With a sip of water the morning of your procedure.  9)  Other Instructions: Call [phone] to cancel the morning of your procedure due         to illness or emergency.  10) We will call within 72 hours to assess how you are feeling.

## 2019-07-16 NOTE — H&P (Signed)
Amy [name] is an 63 y.o. female.   Chief Complaint: Depression improved. Memory impaired HPI: recurrent depression  Past Medical History:  Diagnosis Date  . ADHD (attention deficit hyperactivity disorder)   . Anemia   . Anxiety   . Bipolar disorder ([place])   . Carpal tunnel syndrome of right wrist 09/2014  . Cataract, immature   . Depression   . Diabetic neuropathy (HCC)    feet  . Dyslipidemia   . GERD (gastroesophageal reflux disease)   . Hypertension    states under control with med., has been on med. x 20 yrs.  . Insulin dependent diabetes mellitus   . Major depressive disorder   . Osteoarthritis    knees  . SLEEP APNEA    uses CPAP nightly  . Vitamin D deficiency     Past Surgical History:  Procedure Laterality Date  . BREAST REDUCTION SURGERY Bilateral   . CARPAL TUNNEL RELEASE Right 10/08/2014   Procedure: RIGHT CARPAL TUNNEL RELEASE;  Surgeon: Daryll Brod, MD;  Location: [facility];  Service: Orthopedics;  Laterality: Right;  . COLONOSCOPY WITH PROPOFOL  10/27/2013  . ESOPHAGOGASTRODUODENOSCOPY  02/03/2004  . LAPAROSCOPIC GASTRIC BANDING  12/05/2005    Family History  Problem Relation Age of Onset  . Heart disease Father   . Heart attack Father   . Breast cancer Sister   . Diabetes Sister   . Sarcoidosis Brother   . Cancer Brother        prostate  . Heart failure Mother   . Colon cancer Neg Hx   . Dementia Neg Hx    Social History:  reports that she has never smoked. She has never used smokeless tobacco. She reports that she does not drink alcohol or use drugs.  Allergies: No Known Allergies  (Not in a hospital admission)   Results for orders placed or performed during the hospital encounter of 07/16/19 (from the past 48 hour(s))  Glucose, capillary     Status: Abnormal   Collection Time: 07/16/19  9:22 AM  Result Value Ref Range   Glucose-Capillary 148 (H) 70 - 99 mg/dL   No results found.  Review of Systems  Constitutional:  Negative.   HENT: Negative.   Eyes: Negative.   Respiratory: Negative.   Cardiovascular: Negative.   Gastrointestinal: Negative.   Musculoskeletal: Negative.   Skin: Negative.   Neurological: Negative.   Psychiatric/Behavioral: Positive for memory loss. Negative for depression, hallucinations, substance abuse and suicidal ideas. The patient is not nervous/anxious and does not have insomnia.     Temperature (P) 98.1 F (36.7 C), temperature source (P) Oral, resp. rate (P) 18. Physical Exam  Nursing note and vitals reviewed. Constitutional: She appears well-developed and well-nourished.  HENT:  Head: Normocephalic and atraumatic.  Eyes: Pupils are equal, round, and reactive to light. Conjunctivae are normal.  Neck: Normal range of motion.  Cardiovascular: Regular rhythm and normal heart sounds.  Respiratory: Effort normal.  GI: Soft.  Musculoskeletal: Normal range of motion.  Neurological: She is alert.  Skin: Skin is warm and dry.  Psychiatric: She has a normal mood and affect. Her behavior is normal. Judgment and thought content normal.     Assessment/Plan Finish up with #6 on Friday then reassess in 2 weeks  [name], MD 07/16/2019, 9:59 AM

## 2019-07-16 NOTE — Transfer of Care (Signed)
Immediate Anesthesia Transfer of Care Note  Patient: [name]  Procedure(s) Performed: ECT TX  Patient Location: PACU  Anesthesia Type:General  Level of Consciousness: sedated  Airway & Oxygen Therapy: Patient Spontanous Breathing and Patient connected to face mask oxygen  Post-op Assessment: Report given to RN and Post -op Vital signs reviewed and stable  Post vital signs: Reviewed  Last Vitals:  Vitals Value Taken Time  BP 119/76 07/16/19 1035  Temp    Pulse    Resp 14 07/16/19 1035  SpO2 99 % 07/16/19 1034  Vitals shown include unvalidated device data.  Last Pain:  Vitals:   07/16/19 0845  TempSrc: (P) Oral         Complications: No apparent anesthesia complications

## 2019-07-16 NOTE — Anesthesia Postprocedure Evaluation (Signed)
Anesthesia Post Note  Patient: Amy [name]  Procedure(s) Performed: ECT TX  Patient location during evaluation: PACU Anesthesia Type: General Level of consciousness: awake and alert Pain management: pain level controlled Vital Signs Assessment: post-procedure vital signs reviewed and stable Respiratory status: spontaneous breathing, nonlabored ventilation and respiratory function stable Cardiovascular status: blood pressure returned to baseline and stable Postop Assessment: no apparent nausea or vomiting Anesthetic complications: no     Last Vitals:  Vitals:   07/16/19 1105 07/16/19 1136  BP:  126/78  Pulse: 79 80  Resp: 12 16  Temp: 36.7 C 36.8 C  SpO2: 98%     Last Pain:  Vitals:   07/16/19 1136  TempSrc: Oral  PainSc: 0-No pain                 Durenda Hurt

## 2019-07-17 ENCOUNTER — Other Ambulatory Visit: Payer: Self-pay | Admitting: Psychiatry

## 2019-07-17 DIAGNOSIS — E1165 Type 2 diabetes mellitus with hyperglycemia: Secondary | ICD-10-CM | POA: Diagnosis not present

## 2019-07-17 DIAGNOSIS — E7800 Pure hypercholesterolemia, unspecified: Secondary | ICD-10-CM | POA: Diagnosis not present

## 2019-07-17 DIAGNOSIS — I10 Essential (primary) hypertension: Secondary | ICD-10-CM | POA: Diagnosis not present

## 2019-07-17 DIAGNOSIS — M792 Neuralgia and neuritis, unspecified: Secondary | ICD-10-CM | POA: Diagnosis not present

## 2019-07-18 ENCOUNTER — Encounter: Payer: Self-pay | Admitting: Anesthesiology

## 2019-07-18 ENCOUNTER — Other Ambulatory Visit: Payer: Self-pay

## 2019-07-18 ENCOUNTER — Encounter (HOSPITAL_BASED_OUTPATIENT_CLINIC_OR_DEPARTMENT_OTHER)
Admission: RE | Admit: 2019-07-18 | Discharge: 2019-07-18 | Disposition: A | Discharge status: Home / Self Care | Payer: Federal, State, Local not specified - PPO | Source: Ambulatory Visit | Attending: Psychiatry | Admitting: Psychiatry

## 2019-07-18 DIAGNOSIS — E1140 Type 2 diabetes mellitus with diabetic neuropathy, unspecified: Secondary | ICD-10-CM | POA: Diagnosis not present

## 2019-07-18 DIAGNOSIS — M1990 Unspecified osteoarthritis, unspecified site: Secondary | ICD-10-CM | POA: Diagnosis not present

## 2019-07-18 DIAGNOSIS — E1136 Type 2 diabetes mellitus with diabetic cataract: Secondary | ICD-10-CM | POA: Diagnosis not present

## 2019-07-18 DIAGNOSIS — G4730 Sleep apnea, unspecified: Secondary | ICD-10-CM | POA: Diagnosis not present

## 2019-07-18 DIAGNOSIS — F332 Major depressive disorder, recurrent severe without psychotic features: Secondary | ICD-10-CM | POA: Diagnosis not present

## 2019-07-18 DIAGNOSIS — I10 Essential (primary) hypertension: Secondary | ICD-10-CM | POA: Diagnosis not present

## 2019-07-18 DIAGNOSIS — F319 Bipolar disorder, unspecified: Secondary | ICD-10-CM | POA: Diagnosis not present

## 2019-07-18 DIAGNOSIS — E785 Hyperlipidemia, unspecified: Secondary | ICD-10-CM | POA: Diagnosis not present

## 2019-07-18 DIAGNOSIS — K219 Gastro-esophageal reflux disease without esophagitis: Secondary | ICD-10-CM | POA: Diagnosis not present

## 2019-07-18 DIAGNOSIS — R413 Other amnesia: Secondary | ICD-10-CM | POA: Diagnosis not present

## 2019-07-18 DIAGNOSIS — E1149 Type 2 diabetes mellitus with other diabetic neurological complication: Secondary | ICD-10-CM | POA: Diagnosis not present

## 2019-07-18 DIAGNOSIS — F418 Other specified anxiety disorders: Secondary | ICD-10-CM | POA: Diagnosis not present

## 2019-07-18 DIAGNOSIS — Z794 Long term (current) use of insulin: Secondary | ICD-10-CM | POA: Diagnosis not present

## 2019-07-18 LAB — GLUCOSE, CAPILLARY
Glucose-Capillary: 73 mg/dL (ref 70–99)
Glucose-Capillary: 148 mg/dL — ABNORMAL HIGH (ref 70–99)

## 2019-07-18 MED ORDER — METHOHEXITAL SODIUM 0.5 G IJ SOLR
INTRAMUSCULAR | Status: AC
  Filled 2019-07-18: qty 500

## 2019-07-18 MED ORDER — MIDAZOLAM HCL 2 MG/2ML IJ SOLN
INTRAMUSCULAR | Status: AC
  Filled 2019-07-18: qty 2

## 2019-07-18 MED ORDER — DEXTROSE-NACL 5-0.45 % IV SOLN
INTRAVENOUS | Status: DC
  Administered 2019-07-18: 09:00:00 via INTRAVENOUS

## 2019-07-18 MED ORDER — SODIUM CHLORIDE 0.9 % IV SOLN: 500.0000 mL | Freq: Once | INTRAVENOUS | Status: DC

## 2019-07-18 MED ORDER — SUCCINYLCHOLINE CHLORIDE 20 MG/ML IJ SOLN
INTRAMUSCULAR | Status: AC
  Filled 2019-07-18: qty 1

## 2019-07-18 MED ORDER — GLYCOPYRROLATE 0.2 MG/ML IJ SOLN
INTRAMUSCULAR | Status: AC
  Administered 2019-07-18: 0.1 mg via INTRAVENOUS
  Filled 2019-07-18: qty 1

## 2019-07-18 MED ORDER — KETOROLAC TROMETHAMINE 30 MG/ML IJ SOLN
30.0000 mg | Freq: Once | INTRAMUSCULAR | Status: AC
  Administered 2019-07-18: 09:00:00 30 mg via INTRAVENOUS

## 2019-07-18 MED ORDER — METHOHEXITAL SODIUM 100 MG/10ML IV SOSY
PREFILLED_SYRINGE | INTRAVENOUS | Status: DC | PRN
  Administered 2019-07-18: 70 mg via INTRAVENOUS

## 2019-07-18 MED ORDER — GLYCOPYRROLATE 0.2 MG/ML IJ SOLN
0.1000 mg | Freq: Once | INTRAMUSCULAR | Status: AC
  Administered 2019-07-18: 09:00:00 0.1 mg via INTRAVENOUS

## 2019-07-18 MED ORDER — KETOROLAC TROMETHAMINE 30 MG/ML IJ SOLN
INTRAMUSCULAR | Status: AC
  Administered 2019-07-18: 30 mg via INTRAVENOUS
  Filled 2019-07-18: qty 1

## 2019-07-18 MED ORDER — MIDAZOLAM HCL 2 MG/2ML IJ SOLN
INTRAMUSCULAR | Status: DC | PRN
  Administered 2019-07-18: 2 mg via INTRAVENOUS

## 2019-07-18 MED ORDER — SUCCINYLCHOLINE CHLORIDE 200 MG/10ML IV SOSY
PREFILLED_SYRINGE | INTRAVENOUS | Status: DC | PRN
  Administered 2019-07-18: 80 mg via INTRAVENOUS

## 2019-07-18 NOTE — Anesthesia Post-op Follow-up Note (Signed)
Anesthesia QCDR form completed.        

## 2019-07-18 NOTE — H&P (Signed)
Amy [name] is an 63 y.o. female.   Chief Complaint: Patient has complained of some short-term memory loss.  Mood however is improved. HPI: History of recurrent severe depression  Past Medical History:  Diagnosis Date  . ADHD (attention deficit hyperactivity disorder)   . Anemia   . Anxiety   . Bipolar disorder ([place])   . Carpal tunnel syndrome of right wrist 09/2014  . Cataract, immature   . Depression   . Diabetic neuropathy (H[facility])    feet  . Dyslipidemia   . GERD (gastroesophageal reflux disease)   . Hypertension    states under control with med., has been on med. x 20 yrs.  . Insulin dependent diabetes mellitus   . Major depressive disorder   . Osteoarthritis    knees  . SLEEP APNEA    uses CPAP nightly  . Vitamin D deficiency     Past Surgical History:  Procedure Laterality Date  . BREAST REDUCTION SURGERY Bilateral   . CARPAL TUNNEL RELEASE Right 10/08/2014   Procedure: RIGHT CARPAL TUNNEL RELEASE;  Surgeon: Daryll Brod, MD;  Location: [facility];  Service: Orthopedics;  Laterality: Right;  . COLONOSCOPY WITH PROPOFOL  10/27/2013  . ESOPHAGOGASTRODUODENOSCOPY  02/03/2004  . LAPAROSCOPIC GASTRIC BANDING  12/05/2005    Family History  Problem Relation Age of Onset  . Heart disease Father   . Heart attack Father   . Breast cancer Sister   . Diabetes Sister   . Sarcoidosis Brother   . Cancer Brother        prostate  . Heart failure Mother   . Colon cancer Neg Hx   . Dementia Neg Hx    Social History:  reports that she has never smoked. She has never used smokeless tobacco. She reports that she does not drink alcohol or use drugs.  Allergies: No Known Allergies  (Not in a hospital admission)   Results for orders placed or performed during the hospital encounter of 07/18/19 (from the past 48 hour(s))  Glucose, capillary     Status: None   Collection Time: 07/18/19  8:52 AM  Result Value Ref Range   Glucose-Capillary 73 70 - 99 mg/dL   No  results found.  Review of Systems  Constitutional: Negative.   HENT: Negative.   Eyes: Negative.   Respiratory: Negative.   Cardiovascular: Negative.   Gastrointestinal: Negative.   Musculoskeletal: Negative.   Skin: Negative.   Neurological: Negative.   Psychiatric/Behavioral: Positive for memory loss. Negative for depression and suicidal ideas.    Blood pressure 133/78, pulse 65, temperature 98.6 F (37 C), temperature source Oral, resp. rate 18, height 5\' 5"  (1.651 m), weight 71.2 kg, SpO2 100 %. Physical Exam  Nursing note and vitals reviewed. Constitutional: She appears well-developed and well-nourished.  HENT:  Head: Normocephalic and atraumatic.  Eyes: Pupils are equal, round, and reactive to light. Conjunctivae are normal.  Neck: Normal range of motion.  Cardiovascular: Regular rhythm and normal heart sounds.  Respiratory: Effort normal.  GI: Soft.  Musculoskeletal: Normal range of motion.  Neurological: She is alert.  Skin: Skin is warm and dry.  Psychiatric: She has a normal mood and affect. Her behavior is normal. Judgment and thought content normal.     Assessment/Plan This will be the last index treatment and we will see her back in a couple weeks for follow-up  [name], MD 07/18/2019, 10:09 AM

## 2019-07-18 NOTE — Transfer of Care (Addendum)
Immediate Anesthesia Transfer of Care Note  Patient: Amy [name]  Procedure(s) Performed: ECT TX  Patient Location: PACU  Anesthesia Type:General  Level of Consciousness: sedated  Airway & Oxygen Therapy: Patient Spontanous Breathing and Patient connected to face mask oxygen  Post-op Assessment: Report given to RN and Post -op Vital signs reviewed and stable  Post vital signs: Reviewed and stable  Last Vitals:  Vitals Value Taken Time  BP    Temp    Pulse    Resp    SpO2      Last Pain:  Vitals:   07/18/19 1107  TempSrc: Oral  PainSc: 0-No pain         Complications: No apparent anesthesia complications

## 2019-07-18 NOTE — Discharge Instructions (Signed)
1)  The drugs that you have been given will stay in your system until tomorrow so for the       next 24 hours you should not:  A. Drive an automobile  B. Make any legal decisions  C. Drink any alcoholic beverages  2)  You may resume your regular meals upon return home.  3)  A responsible adult must take you home.  Someone should stay with you for a few          hours, then be available by phone for the remainder of the treatment day.  4)  You May experience any of the following symptoms:  Headache, Nausea and a dry mouth (due to the medications you were given),  temporary memory loss and some confusion, or sore muscles (a warm bath  should help this).  If you you experience any of these symptoms let us know on                your return visit.  5)  Report any of the following: any acute discomfort, severe headache, or temperature        greater than 100.5 F.   Also report any unusual redness, swelling, drainage, or pain         at your IV site.    You may report Symptoms to:  [place] [facility]          Phone: [phone], ECT Department           or Dr. Prescott Gum office [phone]  6)  Your next ECT Treatment is Friday December 4   We will call 2 days prior to your scheduled appointment for arrival times.  7)  Nothing to eat or drink after midnight the night before your procedure.  8)  Take      With a sip of water the morning of your procedure.  9)  Other Instructions: Call [phone] to cancel the morning of your procedure due         to illness or emergency.  10) We will call within 72 hours to assess how you are feeling.

## 2019-07-18 NOTE — Procedures (Signed)
ECT SERVICES Physician's Interval Evaluation & Treatment Note  Patient Identification: Amy [name] MRN:  [id number] Date of Evaluation:  07/18/2019 TX #: 5  MADRS:   MMSE:   P.E. Findings:  No change to physical exam  Psychiatric Interval Note:  Memory is showing some short-term problems that she is aware of but the mood is much better  Subjective:  Patient is a 63 y.o. female seen for evaluation for Electroconvulsive Therapy. Patient feels like she is gotten mood benefit  Treatment Summary:   [x]   Right Unilateral             []  Bilateral   % Energy : 0.3 ms 60%   Impedance: 2090 ohms  Seizure Energy Index: For reasons I do not understand the computer did not pick up on reading the EEG seizure even though it was quite robust and obvious.  No reading for this.  Postictal Suppression Index: Also no reading for this although it looked pretty good to me  Seizure Concordance Index: 93%  Medications  Pre Shock: Robinul 0.2 mg Toradol 30 mg Brevital 70 mg succinylcholine a milligram  Post Shock:    Seizure Duration: 60 seconds by EMG 93 seconds by EEG   Comments: End of index course follow-up 2 weeks  Lungs:  [x]   Clear to auscultation               []  Other:   Heart:    [x]   Regular rhythm             []  irregular rhythm    [x]   Previous H&P reviewed, patient examined and there are NO CHANGES                 []   Previous H&P reviewed, patient examined and there are changes noted.   [name], MD 11/20/202010:11 AM

## 2019-07-18 NOTE — Anesthesia Postprocedure Evaluation (Signed)
Anesthesia Post Note  Patient: Amy [name]  Procedure(s) Performed: ECT TX  Patient location during evaluation: PACU Anesthesia Type: General Level of consciousness: awake and alert and oriented Pain management: pain level controlled Vital Signs Assessment: post-procedure vital signs reviewed and stable Respiratory status: spontaneous breathing, nonlabored ventilation and respiratory function stable Cardiovascular status: blood pressure returned to baseline and stable Postop Assessment: no signs of nausea or vomiting Anesthetic complications: no     Last Vitals:  Vitals:   07/18/19 1058 07/18/19 1107  BP: 136/87 136/87  Pulse: 92 92  Resp: 18 18  Temp: (!) 36.3 C 36.4 C  SpO2: 100%     Last Pain:  Vitals:   07/18/19 1107  TempSrc: Oral  PainSc: 0-No pain                 Bonnie Roig

## 2019-07-18 NOTE — Anesthesia Procedure Notes (Signed)
Performed by: Avon Mergenthaler, CRNA Pre-anesthesia Checklist: Patient identified, Emergency Drugs available, Suction available and Patient being monitored Patient Re-evaluated:Patient Re-evaluated prior to induction Oxygen Delivery Method: Circle system utilized Preoxygenation: Pre-oxygenation with 100% oxygen Induction Type: IV induction Ventilation: Mask ventilation without difficulty and Mask ventilation throughout procedure Airway Equipment and Method: Bite block Placement Confirmation: positive ETCO2 Dental Injury: Teeth and Oropharynx as per pre-operative assessment        

## 2019-07-18 NOTE — Anesthesia Preprocedure Evaluation (Signed)
Anesthesia Evaluation  Patient identified by MRN, date of birth, ID band Patient awake    Reviewed: Allergy & Precautions, NPO status , Patient's Chart, lab work & pertinent test results  History of Anesthesia Complications Negative for: history of anesthetic complications  Airway Mallampati: II  TM Distance: >3 FB Neck ROM: Full    Dental no notable dental hx.    Pulmonary sleep apnea , neg COPD,    breath sounds clear to auscultation- rhonchi (-) wheezing      Cardiovascular hypertension, Pt. on medications (-) CAD, (-) Past MI, (-) Cardiac Stents and (-) CABG  Rhythm:Regular Rate:Normal - Systolic murmurs and - Diastolic murmurs    Neuro/Psych PSYCHIATRIC DISORDERS Anxiety Depression Bipolar Disorder TIA   GI/Hepatic Neg liver ROS, GERD  ,  Endo/Other  diabetes, Insulin Dependent, Oral Hypoglycemic Agents  Renal/GU negative Renal ROS     Musculoskeletal  (+) Arthritis ,   Abdominal (+) - obese,   Peds  Hematology  (+) anemia ,   Anesthesia Other Findings Past Medical History: No date: ADHD (attention deficit hyperactivity disorder) No date: Anemia No date: Anxiety No date: Bipolar disorder (HCC) 09/2014: Carpal tunnel syndrome of right wrist No date: Cataract, immature No date: Depression No date: Diabetic neuropathy (HCC)     Comment:  feet No date: Dyslipidemia No date: GERD (gastroesophageal reflux disease) No date: Hypertension     Comment:  states under control with med., has been on med. x 20               yrs. No date: Insulin dependent diabetes mellitus No date: Major depressive disorder No date: Osteoarthritis     Comment:  knees No date: SLEEP APNEA     Comment:  uses CPAP nightly No date: Vitamin D deficiency   Reproductive/Obstetrics                             Anesthesia Physical  Anesthesia Plan  ASA: III  Anesthesia Plan: General   Post-op Pain  Management:    Induction: Intravenous  PONV Risk Score and Plan: 2 and Ondansetron  Airway Management Planned: Mask  Additional Equipment:   Intra-op Plan:   Post-operative Plan:   Informed Consent: I have reviewed the patients History and Physical, chart, labs and discussed the procedure including the risks, benefits and alternatives for the proposed anesthesia with the patient or authorized representative who has indicated his/her understanding and acceptance.     Dental advisory given  Plan Discussed with: CRNA and Anesthesiologist  Anesthesia Plan Comments:         Anesthesia Quick Evaluation  

## 2019-07-30 ENCOUNTER — Telehealth: Payer: Self-pay

## 2019-07-30 ENCOUNTER — Other Ambulatory Visit
Admission: RE | Admit: 2019-07-30 | Discharge: 2019-07-30 | Disposition: A | Discharge status: Home / Self Care | Payer: Federal, State, Local not specified - PPO | Source: Ambulatory Visit | Attending: Psychiatry | Admitting: Psychiatry

## 2019-07-30 DIAGNOSIS — F332 Major depressive disorder, recurrent severe without psychotic features: Secondary | ICD-10-CM | POA: Diagnosis not present

## 2019-07-30 DIAGNOSIS — Z20828 Contact with and (suspected) exposure to other viral communicable diseases: Secondary | ICD-10-CM | POA: Insufficient documentation

## 2019-07-30 DIAGNOSIS — Z01812 Encounter for preprocedural laboratory examination: Secondary | ICD-10-CM | POA: Insufficient documentation

## 2019-07-30 LAB — SARS CORONAVIRUS 2 (TAT 6-24 HRS): SARS Coronavirus 2: NEGATIVE

## 2019-07-31 ENCOUNTER — Other Ambulatory Visit: Payer: Self-pay | Admitting: Psychiatry

## 2019-08-01 ENCOUNTER — Encounter
Admission: RE | Admit: 2019-08-01 | Discharge: 2019-08-01 | Disposition: A | Discharge status: Home / Self Care | Payer: Federal, State, Local not specified - PPO | Source: Ambulatory Visit | Attending: Psychiatry | Admitting: Psychiatry

## 2019-08-01 ENCOUNTER — Other Ambulatory Visit: Payer: Self-pay

## 2019-08-01 ENCOUNTER — Ambulatory Visit: Payer: Self-pay | Admitting: Certified Registered Nurse Anesthetist

## 2019-08-01 ENCOUNTER — Encounter: Payer: Self-pay | Admitting: Certified Registered Nurse Anesthetist

## 2019-08-01 DIAGNOSIS — R413 Other amnesia: Secondary | ICD-10-CM | POA: Diagnosis not present

## 2019-08-01 DIAGNOSIS — E1140 Type 2 diabetes mellitus with diabetic neuropathy, unspecified: Secondary | ICD-10-CM | POA: Diagnosis not present

## 2019-08-01 DIAGNOSIS — G4730 Sleep apnea, unspecified: Secondary | ICD-10-CM | POA: Insufficient documentation

## 2019-08-01 DIAGNOSIS — E1136 Type 2 diabetes mellitus with diabetic cataract: Secondary | ICD-10-CM | POA: Diagnosis not present

## 2019-08-01 DIAGNOSIS — I10 Essential (primary) hypertension: Secondary | ICD-10-CM | POA: Diagnosis not present

## 2019-08-01 DIAGNOSIS — M1990 Unspecified osteoarthritis, unspecified site: Secondary | ICD-10-CM | POA: Insufficient documentation

## 2019-08-01 DIAGNOSIS — F319 Bipolar disorder, unspecified: Secondary | ICD-10-CM | POA: Diagnosis not present

## 2019-08-01 DIAGNOSIS — Z794 Long term (current) use of insulin: Secondary | ICD-10-CM | POA: Insufficient documentation

## 2019-08-01 DIAGNOSIS — F418 Other specified anxiety disorders: Secondary | ICD-10-CM | POA: Diagnosis not present

## 2019-08-01 DIAGNOSIS — F332 Major depressive disorder, recurrent severe without psychotic features: Secondary | ICD-10-CM

## 2019-08-01 DIAGNOSIS — E785 Hyperlipidemia, unspecified: Secondary | ICD-10-CM | POA: Insufficient documentation

## 2019-08-01 LAB — GLUCOSE, CAPILLARY: Glucose-Capillary: 85 mg/dL (ref 70–99)

## 2019-08-01 MED ORDER — LABETALOL HCL 5 MG/ML IV SOLN
INTRAVENOUS | Status: AC
  Filled 2019-08-01: qty 4

## 2019-08-01 MED ORDER — GLYCOPYRROLATE 0.2 MG/ML IJ SOLN
INTRAMUSCULAR | Status: AC
  Filled 2019-08-01: qty 1

## 2019-08-01 MED ORDER — MIDAZOLAM HCL 2 MG/2ML IJ SOLN
INTRAMUSCULAR | Status: AC
  Filled 2019-08-01: qty 2

## 2019-08-01 MED ORDER — KETOROLAC TROMETHAMINE 30 MG/ML IJ SOLN
INTRAMUSCULAR | Status: AC
  Filled 2019-08-01: qty 1

## 2019-08-01 MED ORDER — MIDAZOLAM HCL 2 MG/2ML IJ SOLN
INTRAMUSCULAR | Status: DC | PRN
  Administered 2019-08-01: 2 mg via INTRAVENOUS

## 2019-08-01 MED ORDER — FENTANYL CITRATE (PF) 100 MCG/2ML IJ SOLN: 25.0000 ug | INTRAMUSCULAR | Status: DC | PRN

## 2019-08-01 MED ORDER — ONDANSETRON HCL 4 MG/2ML IJ SOLN: 4.0000 mg | Freq: Once | INTRAMUSCULAR | Status: DC | PRN

## 2019-08-01 MED ORDER — SUCCINYLCHOLINE CHLORIDE 20 MG/ML IJ SOLN
INTRAMUSCULAR | Status: DC | PRN
  Administered 2019-08-01: 80 mg via INTRAVENOUS

## 2019-08-01 MED ORDER — SODIUM CHLORIDE 0.9 % IV SOLN
INTRAVENOUS | Status: DC | PRN
  Administered 2019-08-01: 10:00:00 via INTRAVENOUS

## 2019-08-01 MED ORDER — LABETALOL HCL 5 MG/ML IV SOLN
INTRAVENOUS | Status: DC | PRN
  Administered 2019-08-01: 10 mg via INTRAVENOUS

## 2019-08-01 MED ORDER — METHOHEXITAL SODIUM 100 MG/10ML IV SOSY
PREFILLED_SYRINGE | INTRAVENOUS | Status: DC | PRN
  Administered 2019-08-01: 70 mg via INTRAVENOUS

## 2019-08-01 MED ORDER — ESMOLOL HCL 100 MG/10ML IV SOLN
INTRAVENOUS | Status: DC | PRN
  Administered 2019-08-01 (×2): 10 mg via INTRAVENOUS

## 2019-08-01 MED ORDER — METHOHEXITAL SODIUM 0.5 G IJ SOLR
INTRAMUSCULAR | Status: AC
  Filled 2019-08-01: qty 500

## 2019-08-01 MED ORDER — ESMOLOL HCL 100 MG/10ML IV SOLN
INTRAVENOUS | Status: AC
  Filled 2019-08-01: qty 10

## 2019-08-01 MED ORDER — GLYCOPYRROLATE 0.2 MG/ML IJ SOLN
0.2000 mg | Freq: Once | INTRAMUSCULAR | Status: AC
  Administered 2019-08-01: 0.2 mg via INTRAVENOUS

## 2019-08-01 MED ORDER — SODIUM CHLORIDE 0.9 % IV SOLN
500.0000 mL | Freq: Once | INTRAVENOUS | Status: AC
  Administered 2019-08-01: 500 mL via INTRAVENOUS

## 2019-08-01 MED ORDER — KETOROLAC TROMETHAMINE 30 MG/ML IJ SOLN
30.0000 mg | Freq: Once | INTRAMUSCULAR | Status: AC
  Administered 2019-08-01: 30 mg via INTRAVENOUS

## 2019-08-01 NOTE — Transfer of Care (Signed)
Immediate Anesthesia Transfer of Care Note  Patient: Amy [name]  Procedure(s) Performed: ECT TX  Patient Location: PACU  Anesthesia Type:General  Level of Consciousness: drowsy and patient cooperative  Airway & Oxygen Therapy: Patient Spontanous Breathing and Patient connected to face mask oxygen  Post-op Assessment: Report given to RN and Post -op Vital signs reviewed and stable  Post vital signs: Reviewed and stable  Last Vitals:  Vitals Value Taken Time  BP 116/58 08/01/19 1059  Temp    Pulse 93 08/01/19 1059  Resp 24 08/01/19 1059  SpO2 100 % 08/01/19 1059    Last Pain:  Vitals:   08/01/19 0843  TempSrc: Oral  PainSc: 0-No pain         Complications: No apparent anesthesia complications

## 2019-08-01 NOTE — Anesthesia Preprocedure Evaluation (Signed)
Anesthesia Evaluation  Patient identified by MRN, date of birth, ID band Patient awake    Reviewed: Allergy & Precautions, NPO status , Patient's Chart, lab work & pertinent test results  History of Anesthesia Complications Negative for: history of anesthetic complications  Airway Mallampati: II  TM Distance: >3 FB Neck ROM: Full    Dental no notable dental hx.    Pulmonary sleep apnea , neg COPD,    breath sounds clear to auscultation- rhonchi (-) wheezing      Cardiovascular hypertension, Pt. on medications (-) CAD, (-) Past MI, (-) Cardiac Stents and (-) CABG  Rhythm:Regular Rate:Normal - Systolic murmurs and - Diastolic murmurs    Neuro/Psych PSYCHIATRIC DISORDERS Anxiety Depression Bipolar Disorder TIA   GI/Hepatic Neg liver ROS, GERD  ,  Endo/Other  diabetes, Insulin Dependent, Oral Hypoglycemic Agents  Renal/GU negative Renal ROS     Musculoskeletal  (+) Arthritis ,   Abdominal (+) - obese,   Peds  Hematology  (+) anemia ,   Anesthesia Other Findings Past Medical History: No date: ADHD (attention deficit hyperactivity disorder) No date: Anemia No date: Anxiety No date: Bipolar disorder ([place]) 09/2014: Carpal tunnel syndrome of right wrist No date: Cataract, immature No date: Depression No date: Diabetic neuropathy (HCC)     Comment:  feet No date: Dyslipidemia No date: GERD (gastroesophageal reflux disease) No date: Hypertension     Comment:  states under control with med., has been on med. x 20               yrs. No date: Insulin dependent diabetes mellitus No date: Major depressive disorder No date: Osteoarthritis     Comment:  knees No date: SLEEP APNEA     Comment:  uses CPAP nightly No date: Vitamin D deficiency   Reproductive/Obstetrics                             Anesthesia Physical  Anesthesia Plan  ASA: III  Anesthesia Plan: General   Post-op Pain  Management:    Induction: Intravenous  PONV Risk Score and Plan: 2 and Ondansetron  Airway Management Planned: Mask  Additional Equipment:   Intra-op Plan:   Post-operative Plan:   Informed Consent: I have reviewed the patients History and Physical, chart, labs and discussed the procedure including the risks, benefits and alternatives for the proposed anesthesia with the patient or authorized representative who has indicated his/her understanding and acceptance.     Dental advisory given  Plan Discussed with: CRNA and Anesthesiologist  Anesthesia Plan Comments:         Anesthesia Quick Evaluation

## 2019-08-01 NOTE — Anesthesia Postprocedure Evaluation (Signed)
Anesthesia Post Note  Patient: Amy [name]  Procedure(s) Performed: ECT TX  Patient location during evaluation: PACU Anesthesia Type: General Level of consciousness: awake and alert and oriented Pain management: pain level controlled Vital Signs Assessment: post-procedure vital signs reviewed and stable Respiratory status: spontaneous breathing Cardiovascular status: blood pressure returned to baseline Anesthetic complications: no     Last Vitals:  Vitals:   08/01/19 1109 08/01/19 1125  BP:  114/79  Pulse: (!) 101 94  Resp: 16 13  Temp:  36.6 C  SpO2: 95% 92%    Last Pain:  Vitals:   08/01/19 1125  TempSrc:   PainSc: 0-No pain                 Onalee Steinbach

## 2019-08-01 NOTE — H&P (Signed)
Amy [name] is an 63 y.o. female.   Chief Complaint: Patient is reporting that she is still having some memory impairment and confusion.  This is confirmed by her husband.  Her mood is described as good and she denies any symptoms of depression including any suicidal thoughts HPI: History of recurrent severe depression had a good index course of ECT that ended a couple weeks ago  Past Medical History:  Diagnosis Date  . ADHD (attention deficit hyperactivity disorder)   . Anemia   . Anxiety   . Bipolar disorder ([place])   . Carpal tunnel syndrome of right wrist 09/2014  . Cataract, immature   . Depression   . Diabetic neuropathy ([facility])    feet  . Dyslipidemia   . GERD (gastroesophageal reflux disease)   . Hypertension    states under control with med., has been on med. x 20 yrs.  . Insulin dependent diabetes mellitus   . Major depressive disorder   . Osteoarthritis    knees  . SLEEP APNEA    uses CPAP nightly  . Vitamin D deficiency     Past Surgical History:  Procedure Laterality Date  . BREAST REDUCTION SURGERY Bilateral   . CARPAL TUNNEL RELEASE Right 10/08/2014   Procedure: RIGHT CARPAL TUNNEL RELEASE;  Surgeon: Daryll Brod, MD;  Location: [facility];  Service: Orthopedics;  Laterality: Right;  . COLONOSCOPY WITH PROPOFOL  10/27/2013  . ESOPHAGOGASTRODUODENOSCOPY  02/03/2004  . LAPAROSCOPIC GASTRIC BANDING  12/05/2005    Family History  Problem Relation Age of Onset  . Heart disease Father   . Heart attack Father   . Breast cancer Sister   . Diabetes Sister   . Sarcoidosis Brother   . Cancer Brother        prostate  . Heart failure Mother   . Colon cancer Neg Hx   . Dementia Neg Hx    Social History:  reports that she has never smoked. She has never used smokeless tobacco. She reports that she does not drink alcohol or use drugs.  Allergies: No Known Allergies  (Not in a hospital admission)   Results for orders placed or performed during the  hospital encounter of 08/01/19 (from the past 48 hour(s))  Glucose, capillary     Status: None   Collection Time: 08/01/19  8:59 AM  Result Value Ref Range   Glucose-Capillary 85 70 - 99 mg/dL   No results found.  Review of Systems  Constitutional: Negative.   HENT: Negative.   Eyes: Negative.   Respiratory: Negative.   Cardiovascular: Negative.   Gastrointestinal: Negative.   Musculoskeletal: Negative.   Skin: Negative.   Neurological: Negative.   Psychiatric/Behavioral: Positive for memory loss. Negative for depression.    Blood pressure 114/79, pulse 94, temperature 97.9 F (36.6 C), resp. rate 13, SpO2 92 %. Physical Exam  Nursing note and vitals reviewed. Constitutional: She appears well-developed and well-nourished.  HENT:  Head: Normocephalic and atraumatic.  Eyes: Pupils are equal, round, and reactive to light. Conjunctivae are normal.  Neck: Normal range of motion.  Cardiovascular: Regular rhythm and normal heart sounds.  Respiratory: Effort normal.  GI: Soft.  Musculoskeletal: Normal range of motion.  Neurological: She is alert.  Skin: Skin is warm and dry.  Psychiatric: Judgment normal. Her speech is delayed. She is slowed. Thought content is not paranoid. She expresses no homicidal and no suicidal ideation. She exhibits abnormal recent memory.     Assessment/Plan Spoke with her about  side effects.  Based on the history I am hearing which is that she was actually doing better with her memory when we first finished ECT and has been doing worse now I suspect that this memory impairment may be due to depression or 2 medications.  I suggested that she follow-up with her outpatient psychiatrist about her medicine.  We are going to do treatment today and then see her back in 2 weeks.  [name], MD 08/01/2019, 4:39 PM

## 2019-08-01 NOTE — Procedures (Signed)
ECT SERVICES Physician's Interval Evaluation & Treatment Note  Patient Identification: Amy [name] MRN:  [id number] Date of Evaluation:  08/01/2019 TX #: 6 (first maintenance treatment  MADRS:   MMSE:   P.E. Findings:  No change to physical exam vitals unremarkable  Psychiatric Interval Note:  Mood stated as being good but complains of memory impairment  Subjective:  Patient is a 63 y.o. female seen for evaluation for Electroconvulsive Therapy. Subjective memory impairment  Treatment Summary:   [x]   Right Unilateral             []  Bilateral   % Energy : 0.3 ms 60%   Impedance: 2540 ohms  Seizure Energy Index: 26,439 V squared  Postictal Suppression Index: 19%  Seizure Concordance Index: 85%  Medications  Pre Shock: Robinul 0.2 mg Toradol 30 mg Brevital 70 mg succinylcholine 80 mg  Post Shock: Labetalol 10 mg Versed 2 mg  Seizure Duration: 87 seconds by EMG and movement most likely greater than 110 seconds by EEG.  The paper ran out and had to be replaced so the exact end was not recorded in the computer had a faulty measurement.  Patient had definitely ended her seizure by the time we started recording again which was at about 125 seconds   Comments: Follow-up in 2 weeks  Lungs:  [x]   Clear to auscultation               []  Other:   Heart:    [x]   Regular rhythm             []  irregular rhythm    [x]   Previous H&P reviewed, patient examined and there are NO CHANGES                 []   Previous H&P reviewed, patient examined and there are changes noted.   [name], MD 12/4/20204:41 PM

## 2019-08-01 NOTE — Anesthesia Post-op Follow-up Note (Signed)
Anesthesia QCDR form completed.        

## 2019-08-01 NOTE — Discharge Instructions (Signed)
1)  The drugs that you have been given will stay in your system until tomorrow so for the       next 24 hours you should not:  A. Drive an automobile  B. Make any legal decisions  C. Drink any alcoholic beverages  2)  You may resume your regular meals upon return home.  3)  A responsible adult must take you home.  Someone should stay with you for a few          hours, then be available by phone for the remainder of the treatment day.  4)  You May experience any of the following symptoms:  Headache, Nausea and a dry mouth (due to the medications you were given),  temporary memory loss and some confusion, or sore muscles (a warm bath  should help this).  If you you experience any of these symptoms let us know on                your return visit.  5)  Report any of the following: any acute discomfort, severe headache, or temperature        greater than 100.5 F.   Also report any unusual redness, swelling, drainage, or pain         at your IV site.    You may report Symptoms to:  [facility]          Phone: [phone], ECT Department           or Dr. Prescott Gum office [phone]  6)  Your next ECT Treatment is Friday December 18 at 8:30. PLEASE HAVE A COVID TEST PERFORMED DEC[place]   We will call 2 days prior to your scheduled appointment for arrival times.  7)  Nothing to eat or drink after midnight the night before your procedure.  8)  Take     With a sip of water the morning of your procedure.  9)  Other Instructions: Call [phone] to cancel the morning of your procedure due         to illness or emergency.  10) We will call within 72 hours to assess how you are feeling.

## 2019-08-06 DIAGNOSIS — F332 Major depressive disorder, recurrent severe without psychotic features: Secondary | ICD-10-CM | POA: Diagnosis not present

## 2019-08-08 ENCOUNTER — Telehealth: Payer: Self-pay

## 2019-08-12 DIAGNOSIS — H5213 Myopia, bilateral: Secondary | ICD-10-CM | POA: Diagnosis not present

## 2019-08-12 DIAGNOSIS — H43813 Vitreous degeneration, bilateral: Secondary | ICD-10-CM | POA: Diagnosis not present

## 2019-08-12 DIAGNOSIS — H2513 Age-related nuclear cataract, bilateral: Secondary | ICD-10-CM | POA: Diagnosis not present

## 2019-08-12 DIAGNOSIS — E119 Type 2 diabetes mellitus without complications: Secondary | ICD-10-CM | POA: Diagnosis not present

## 2019-08-13 ENCOUNTER — Other Ambulatory Visit
Admission: RE | Admit: 2019-08-13 | Discharge: 2019-08-13 | Disposition: A | Discharge status: Home / Self Care | Payer: Federal, State, Local not specified - PPO | Source: Ambulatory Visit | Attending: Psychiatry | Admitting: Psychiatry

## 2019-08-13 DIAGNOSIS — F332 Major depressive disorder, recurrent severe without psychotic features: Secondary | ICD-10-CM | POA: Diagnosis not present

## 2019-08-13 DIAGNOSIS — Z01812 Encounter for preprocedural laboratory examination: Secondary | ICD-10-CM | POA: Diagnosis not present

## 2019-08-13 DIAGNOSIS — Z20828 Contact with and (suspected) exposure to other viral communicable diseases: Secondary | ICD-10-CM | POA: Diagnosis not present

## 2019-08-13 LAB — SARS CORONAVIRUS 2 (TAT 6-24 HRS): SARS Coronavirus 2: NEGATIVE

## 2019-08-14 ENCOUNTER — Other Ambulatory Visit: Payer: Self-pay | Admitting: Psychiatry

## 2019-08-15 ENCOUNTER — Encounter: Payer: Self-pay | Admitting: Certified Registered Nurse Anesthetist

## 2019-08-15 ENCOUNTER — Encounter (HOSPITAL_BASED_OUTPATIENT_CLINIC_OR_DEPARTMENT_OTHER)
Admission: RE | Admit: 2019-08-15 | Discharge: 2019-08-15 | Disposition: A | Discharge status: Home / Self Care | Payer: Federal, State, Local not specified - PPO | Source: Ambulatory Visit | Attending: Psychiatry | Admitting: Psychiatry

## 2019-08-15 ENCOUNTER — Other Ambulatory Visit: Payer: Self-pay

## 2019-08-15 DIAGNOSIS — Z794 Long term (current) use of insulin: Secondary | ICD-10-CM | POA: Diagnosis not present

## 2019-08-15 DIAGNOSIS — M1990 Unspecified osteoarthritis, unspecified site: Secondary | ICD-10-CM | POA: Diagnosis not present

## 2019-08-15 DIAGNOSIS — F332 Major depressive disorder, recurrent severe without psychotic features: Secondary | ICD-10-CM

## 2019-08-15 DIAGNOSIS — D649 Anemia, unspecified: Secondary | ICD-10-CM | POA: Diagnosis not present

## 2019-08-15 DIAGNOSIS — F319 Bipolar disorder, unspecified: Secondary | ICD-10-CM | POA: Diagnosis not present

## 2019-08-15 DIAGNOSIS — F418 Other specified anxiety disorders: Secondary | ICD-10-CM | POA: Diagnosis not present

## 2019-08-15 DIAGNOSIS — G4730 Sleep apnea, unspecified: Secondary | ICD-10-CM | POA: Diagnosis not present

## 2019-08-15 DIAGNOSIS — E785 Hyperlipidemia, unspecified: Secondary | ICD-10-CM | POA: Diagnosis not present

## 2019-08-15 DIAGNOSIS — R413 Other amnesia: Secondary | ICD-10-CM | POA: Diagnosis not present

## 2019-08-15 DIAGNOSIS — K219 Gastro-esophageal reflux disease without esophagitis: Secondary | ICD-10-CM | POA: Diagnosis not present

## 2019-08-15 DIAGNOSIS — I10 Essential (primary) hypertension: Secondary | ICD-10-CM | POA: Diagnosis not present

## 2019-08-15 DIAGNOSIS — E1140 Type 2 diabetes mellitus with diabetic neuropathy, unspecified: Secondary | ICD-10-CM | POA: Diagnosis not present

## 2019-08-15 DIAGNOSIS — E1136 Type 2 diabetes mellitus with diabetic cataract: Secondary | ICD-10-CM | POA: Diagnosis not present

## 2019-08-15 LAB — GLUCOSE, CAPILLARY: Glucose-Capillary: 87 mg/dL (ref 70–99)

## 2019-08-15 MED ORDER — KETOROLAC TROMETHAMINE 30 MG/ML IJ SOLN
INTRAMUSCULAR | Status: AC
  Administered 2019-08-15: 30 mg via INTRAVENOUS
  Filled 2019-08-15: qty 1

## 2019-08-15 MED ORDER — MIDAZOLAM HCL 2 MG/2ML IJ SOLN: 2.0000 mg | Freq: Once | INTRAMUSCULAR | Status: DC

## 2019-08-15 MED ORDER — SODIUM CHLORIDE 0.9 % IV SOLN: INTRAVENOUS | Status: DC | PRN

## 2019-08-15 MED ORDER — GLYCOPYRROLATE 0.2 MG/ML IJ SOLN
0.2000 mg | Freq: Once | INTRAMUSCULAR | Status: AC
  Administered 2019-08-15: 0.2 mg via INTRAVENOUS

## 2019-08-15 MED ORDER — METHOHEXITAL SODIUM 100 MG/10ML IV SOSY
PREFILLED_SYRINGE | INTRAVENOUS | Status: DC | PRN
  Administered 2019-08-15: 70 mg via INTRAVENOUS

## 2019-08-15 MED ORDER — SODIUM CHLORIDE 0.9 % IV SOLN
500.0000 mL | Freq: Once | INTRAVENOUS | Status: AC
  Administered 2019-08-15: 500 mL via INTRAVENOUS

## 2019-08-15 MED ORDER — KETOROLAC TROMETHAMINE 30 MG/ML IJ SOLN: 30.0000 mg | Freq: Once | INTRAMUSCULAR | Status: AC

## 2019-08-15 MED ORDER — MIDAZOLAM HCL 2 MG/2ML IJ SOLN
INTRAMUSCULAR | Status: AC
  Filled 2019-08-15: qty 2

## 2019-08-15 MED ORDER — LABETALOL HCL 5 MG/ML IV SOLN
INTRAVENOUS | Status: DC | PRN
  Administered 2019-08-15: 10 mg via INTRAVENOUS

## 2019-08-15 MED ORDER — GLYCOPYRROLATE 0.2 MG/ML IJ SOLN
INTRAMUSCULAR | Status: AC
  Filled 2019-08-15: qty 1

## 2019-08-15 MED ORDER — SUCCINYLCHOLINE CHLORIDE 200 MG/10ML IV SOSY
PREFILLED_SYRINGE | INTRAVENOUS | Status: DC | PRN
  Administered 2019-08-15: 80 mg via INTRAVENOUS

## 2019-08-15 NOTE — H&P (Signed)
Amy [name] is an 63 y.o. female.   Chief Complaint: ups and downs but overall better HPI: chronic depression  Past Medical History:  Diagnosis Date  . ADHD (attention deficit hyperactivity disorder)   . Anemia   . Anxiety   . Bipolar disorder ([place])   . Carpal tunnel syndrome of right wrist 09/2014  . Cataract, immature   . Depression   . Diabetic neuropathy (HCC)    feet  . Dyslipidemia   . GERD (gastroesophageal reflux disease)   . Hypertension    states under control with med., has been on med. x 20 yrs.  . Insulin dependent diabetes mellitus   . Major depressive disorder   . Osteoarthritis    knees  . SLEEP APNEA    uses CPAP nightly  . Vitamin D deficiency     Past Surgical History:  Procedure Laterality Date  . BREAST REDUCTION SURGERY Bilateral   . CARPAL TUNNEL RELEASE Right 10/08/2014   Procedure: RIGHT CARPAL TUNNEL RELEASE;  Surgeon: Daryll Brod, MD;  Location: [place];  Service: Orthopedics;  Laterality: Right;  . COLONOSCOPY WITH PROPOFOL  10/27/2013  . ESOPHAGOGASTRODUODENOSCOPY  02/03/2004  . LAPAROSCOPIC GASTRIC BANDING  12/05/2005    Family History  Problem Relation Age of Onset  . Heart disease Father   . Heart attack Father   . Breast cancer Sister   . Diabetes Sister   . Sarcoidosis Brother   . Cancer Brother        prostate  . Heart failure Mother   . Colon cancer Neg Hx   . Dementia Neg Hx    Social History:  reports that she has never smoked. She has never used smokeless tobacco. She reports that she does not drink alcohol or use drugs.  Allergies: No Known Allergies  (Not in a hospital admission)   Results for orders placed or performed during the hospital encounter of 08/15/19 (from the past 48 hour(s))  Glucose, capillary     Status: None   Collection Time: 08/15/19  8:46 AM  Result Value Ref Range   Glucose-Capillary 87 70 - 99 mg/dL   No results found.  Review of Systems  Constitutional: Negative.   HENT:  Negative.   Eyes: Negative.   Respiratory: Negative.   Cardiovascular: Negative.   Gastrointestinal: Negative.   Musculoskeletal: Negative.   Skin: Negative.   Neurological: Negative.   Psychiatric/Behavioral: Negative.     Blood pressure 138/85, pulse 75, temperature 98.1 F (36.7 C), resp. rate 16, height 5\' 5"  (1.651 m), weight 73.9 kg, SpO2 100 %. Physical Exam  Nursing note and vitals reviewed. Constitutional: She appears well-developed and well-nourished.  HENT:  Head: Normocephalic and atraumatic.  Eyes: Pupils are equal, round, and reactive to light. Conjunctivae are normal.  Cardiovascular: Regular rhythm and normal heart sounds.  Respiratory: Effort normal.  GI: Soft.  Musculoskeletal:        General: Normal range of motion.     Cervical back: Normal range of motion.  Neurological: She is alert.  Skin: Skin is warm and dry.  Psychiatric: Judgment and thought content normal. Her affect is blunt. Her speech is delayed. She is slowed. She exhibits abnormal recent memory.     Assessment/Plan Treatment today and follow up in about 2 weeks  [name], MD 08/15/2019, 9:49 AM

## 2019-08-15 NOTE — Anesthesia Preprocedure Evaluation (Signed)
Anesthesia Evaluation  Patient identified by MRN, date of birth, ID band Patient awake    Reviewed: Allergy & Precautions, H&P , NPO status , Patient's Chart, lab work & pertinent test results  Airway Mallampati: II  TM Distance: >3 FB Neck ROM: full    Dental  (+) Implants   Pulmonary neg shortness of breath, sleep apnea , neg COPD, neg recent URI, Not current smoker,           Cardiovascular hypertension, (-) angina(-) Cardiac Stents (-) dysrhythmias      Neuro/Psych PSYCHIATRIC DISORDERS Anxiety Depression Bipolar Disorder Diabetic neuropathy TIA   GI/Hepatic Neg liver ROS, GERD  Controlled,  Endo/Other  diabetes  Renal/GU negative Renal ROS  negative genitourinary   Musculoskeletal   Abdominal   Peds  Hematology negative hematology ROS (+)   Anesthesia Other Findings Past Medical History: No date: ADHD (attention deficit hyperactivity disorder) No date: Anemia No date: Anxiety No date: Bipolar disorder ([place]) 09/2014: Carpal tunnel syndrome of right wrist No date: Cataract, immature No date: Depression No date: Diabetic neuropathy (HCC)     Comment:  feet No date: Dyslipidemia No date: GERD (gastroesophageal reflux disease) No date: Hypertension     Comment:  states under control with med., has been on med. x 20               yrs. No date: Insulin dependent diabetes mellitus No date: Major depressive disorder No date: Osteoarthritis     Comment:  knees No date: SLEEP APNEA     Comment:  uses CPAP nightly No date: Vitamin D deficiency  Past Surgical History: No date: BREAST REDUCTION SURGERY; Bilateral 10/08/2014: CARPAL TUNNEL RELEASE; Right     Comment:  Procedure: RIGHT CARPAL TUNNEL RELEASE;  Surgeon: Daryll Brod, MD;  Location: [facility];                Service: Orthopedics;  Laterality: Right; 10/27/2013: COLONOSCOPY WITH PROPOFOL 02/03/2004:  ESOPHAGOGASTRODUODENOSCOPY 12/05/2005: LAPAROSCOPIC GASTRIC BANDING     Reproductive/Obstetrics negative OB ROS                             Anesthesia Physical  Anesthesia Plan  ASA: III  Anesthesia Plan: General   Post-op Pain Management:    Induction:   PONV Risk Score and Plan:   Airway Management Planned: Mask  Additional Equipment:   Intra-op Plan:   Post-operative Plan:   Informed Consent: I have reviewed the patients History and Physical, chart, labs and discussed the procedure including the risks, benefits and alternatives for the proposed anesthesia with the patient or authorized representative who has indicated his/her understanding and acceptance.     Dental Advisory Given  Plan Discussed with: Anesthesiologist, CRNA and Surgeon  Anesthesia Plan Comments:         Anesthesia Quick Evaluation

## 2019-08-15 NOTE — Procedures (Signed)
ECT SERVICES Physician's Interval Evaluation & Treatment Note  Patient Identification: Amy [name] MRN:  [id number] Date of Evaluation:  08/15/2019 TX #: 7  MADRS: 28  MMSE: 30  P.E. Findings:  No change physical exam  Psychiatric Interval Note:  Mood seems to be improved overall although she still complains of some memory problems  Subjective:  Patient is a 63 y.o. female seen for evaluation for Electroconvulsive Therapy. Concerned about memory but generally feeling less depressed  Treatment Summary:   [x]   Right Unilateral             []  Bilateral   % Energy : 0.3 ms 60%   Impedance: 2480 ohms  Seizure Energy Index: 26,220 V squared  Postictal Suppression Index: Less than 10% (probably an accurate)  Seizure Concordance Index: 95%  Medications  Pre Shock: Robinul 0.2 mg Toradol 30 mg Brevital 70 mg succinylcholine 80 mg  Post Shock: Labetalol 10 mg  Seizure Duration: 27 seconds EMG 83 seconds EEG   Comments: Next treatment in about 2 weeks  Lungs:  [x]   Clear to auscultation               []  Other:   Heart:    [x]   Regular rhythm             []  irregular rhythm    [x]   Previous H&P reviewed, patient examined and there are NO CHANGES                 []   Previous H&P reviewed, patient examined and there are changes noted.   [name], MD 12/18/20204:02 PM

## 2019-08-15 NOTE — Anesthesia Procedure Notes (Signed)
Date/Time: 08/15/2019 10:05 AM Performed by: Doreen Salvage, CRNA Pre-anesthesia Checklist: Patient identified, Emergency Drugs available, Suction available and Patient being monitored Patient Re-evaluated:Patient Re-evaluated prior to induction Oxygen Delivery Method: Circle system utilized Preoxygenation: Pre-oxygenation with 100% oxygen Induction Type: IV induction Ventilation: Mask ventilation without difficulty and Mask ventilation throughout procedure Airway Equipment and Method: Bite block Placement Confirmation: positive ETCO2 Dental Injury: Teeth and Oropharynx as per pre-operative assessment

## 2019-08-15 NOTE — Anesthesia Postprocedure Evaluation (Signed)
Anesthesia Post Note  Patient: Amy [name]  Procedure(s) Performed: ECT TX  Patient location during evaluation: PACU Anesthesia Type: General Level of consciousness: awake and alert Pain management: pain level controlled Vital Signs Assessment: post-procedure vital signs reviewed and stable Respiratory status: spontaneous breathing, nonlabored ventilation and respiratory function stable Cardiovascular status: blood pressure returned to baseline and stable Postop Assessment: no apparent nausea or vomiting Anesthetic complications: no     Last Vitals:  Vitals:   08/15/19 1048 08/15/19 1107  BP: 130/84 132/82  Pulse: 82 82  Resp: 14 18  Temp: 36.7 C 36.8 C  SpO2: 100%     Last Pain:  Vitals:   08/15/19 1107  TempSrc: Oral  PainSc: 0-No pain                 Tera Mater

## 2019-08-15 NOTE — Anesthesia Post-op Follow-up Note (Signed)
Anesthesia QCDR form completed.        

## 2019-08-15 NOTE — Transfer of Care (Signed)
Immediate Anesthesia Transfer of Care Note  Patient: Amy [name]  Procedure(s) Performed: ECT TX  Patient Location: PACU  Anesthesia Type:General  Level of Consciousness: awake and patient cooperative  Airway & Oxygen Therapy: Patient Spontanous Breathing and Patient connected to nasal cannula oxygen  Post-op Assessment: Report given to RN and Post -op Vital signs reviewed and stable  Post vital signs: Reviewed and stable  Last Vitals:  Vitals Value Taken Time  BP 138/89 08/15/19 1028  Temp 36.9 C 08/15/19 1027  Pulse 82 08/15/19 1028  Resp 11 08/15/19 1028  SpO2 100 % 08/15/19 1028  Vitals shown include unvalidated device data.  Last Pain:  Vitals:   08/15/19 1027  TempSrc: Temporal  PainSc: Asleep         Complications: No apparent anesthesia complications

## 2019-08-15 NOTE — Discharge Instructions (Signed)
1)  The drugs that you have been given will stay in your system until tomorrow so for the       next 24 hours you should not:  A. Drive an automobile  B. Make any legal decisions  C. Drink any alcoholic beverages  2)  You may resume your regular meals upon return home.  3)  A responsible adult must take you home.  Someone should stay with you for a few          hours, then be available by phone for the remainder of the treatment day.  4)  You May experience any of the following symptoms:  Headache, Nausea and a dry mouth (due to the medications you were given),  temporary memory loss and some confusion, or sore muscles (a warm bath  should help this).  If you you experience any of these symptoms let us know on                your return visit.  5)  Report any of the following: any acute discomfort, severe headache, or temperature        greater than 100.5 F.   Also report any unusual redness, swelling, drainage, or pain         at your IV site.    You may report Symptoms to:  [place] [facility]          Phone: [phone], ECT Department           or Dr. Prescott Gum office [phone]  6)  Your next ECT Treatment is Monday December 21 at 8:15  We will call 2 days prior to your scheduled appointment for arrival times.  7)  Nothing to eat or drink after midnight the night before your procedure.  8)  Take     With a sip of water the morning of your procedure.  9)  Other Instructions: Call [phone] to cancel the morning of your procedure due         to illness or emergency.  10) We will call within 72 hours to assess how you are feeling.

## 2019-08-18 DIAGNOSIS — E1140 Type 2 diabetes mellitus with diabetic neuropathy, unspecified: Secondary | ICD-10-CM | POA: Diagnosis not present

## 2019-08-18 DIAGNOSIS — Z9884 Bariatric surgery status: Secondary | ICD-10-CM | POA: Diagnosis not present

## 2019-08-18 DIAGNOSIS — F3175 Bipolar disorder, in partial remission, most recent episode depressed: Secondary | ICD-10-CM | POA: Diagnosis not present

## 2019-08-18 DIAGNOSIS — I10 Essential (primary) hypertension: Secondary | ICD-10-CM | POA: Diagnosis not present

## 2019-08-20 DIAGNOSIS — F332 Major depressive disorder, recurrent severe without psychotic features: Secondary | ICD-10-CM | POA: Diagnosis not present

## 2019-08-27 ENCOUNTER — Other Ambulatory Visit
Admission: RE | Admit: 2019-08-27 | Discharge: 2019-08-27 | Disposition: A | Discharge status: Home / Self Care | Payer: Federal, State, Local not specified - PPO | Source: Ambulatory Visit | Attending: Psychiatry | Admitting: Psychiatry

## 2019-08-27 ENCOUNTER — Other Ambulatory Visit: Payer: Self-pay

## 2019-08-27 DIAGNOSIS — Z01812 Encounter for preprocedural laboratory examination: Secondary | ICD-10-CM | POA: Diagnosis not present

## 2019-08-27 DIAGNOSIS — Z20828 Contact with and (suspected) exposure to other viral communicable diseases: Secondary | ICD-10-CM | POA: Insufficient documentation

## 2019-08-27 DIAGNOSIS — F332 Major depressive disorder, recurrent severe without psychotic features: Secondary | ICD-10-CM | POA: Diagnosis not present

## 2019-08-28 LAB — SARS CORONAVIRUS 2 (TAT 6-24 HRS): SARS Coronavirus 2: NEGATIVE

## 2019-08-31 ENCOUNTER — Telehealth: Payer: Self-pay

## 2019-09-01 ENCOUNTER — Telehealth: Payer: Self-pay

## 2019-09-03 DIAGNOSIS — F332 Major depressive disorder, recurrent severe without psychotic features: Secondary | ICD-10-CM | POA: Diagnosis not present

## 2019-09-05 ENCOUNTER — Other Ambulatory Visit: Payer: Self-pay

## 2019-09-05 ENCOUNTER — Other Ambulatory Visit
Admission: RE | Admit: 2019-09-05 | Discharge: 2019-09-05 | Disposition: A | Discharge status: Home / Self Care | Payer: Federal, State, Local not specified - PPO | Source: Ambulatory Visit | Attending: Psychiatry | Admitting: Psychiatry

## 2019-09-05 DIAGNOSIS — Z20822 Contact with and (suspected) exposure to covid-19: Secondary | ICD-10-CM | POA: Insufficient documentation

## 2019-09-05 DIAGNOSIS — Z01812 Encounter for preprocedural laboratory examination: Secondary | ICD-10-CM | POA: Insufficient documentation

## 2019-09-05 LAB — SARS CORONAVIRUS 2 (TAT 6-24 HRS): SARS Coronavirus 2: NEGATIVE

## 2019-09-10 DIAGNOSIS — F332 Major depressive disorder, recurrent severe without psychotic features: Secondary | ICD-10-CM | POA: Diagnosis not present

## 2019-09-11 ENCOUNTER — Other Ambulatory Visit: Payer: Self-pay | Admitting: Psychiatry

## 2019-09-11 NOTE — Progress Notes (Unsigned)
Late.  Just

## 2019-09-12 ENCOUNTER — Encounter
Admission: RE | Admit: 2019-09-12 | Discharge: 2019-09-12 | Disposition: A | Discharge status: Home / Self Care | Payer: Federal, State, Local not specified - PPO | Source: Ambulatory Visit | Attending: Psychiatry | Admitting: Psychiatry

## 2019-09-12 ENCOUNTER — Encounter: Payer: Self-pay | Admitting: Certified Registered Nurse Anesthetist

## 2019-09-12 ENCOUNTER — Other Ambulatory Visit: Payer: Self-pay

## 2019-09-12 DIAGNOSIS — E1136 Type 2 diabetes mellitus with diabetic cataract: Secondary | ICD-10-CM | POA: Insufficient documentation

## 2019-09-12 DIAGNOSIS — R413 Other amnesia: Secondary | ICD-10-CM | POA: Diagnosis not present

## 2019-09-12 DIAGNOSIS — I10 Essential (primary) hypertension: Secondary | ICD-10-CM | POA: Diagnosis not present

## 2019-09-12 DIAGNOSIS — Z794 Long term (current) use of insulin: Secondary | ICD-10-CM | POA: Diagnosis not present

## 2019-09-12 DIAGNOSIS — E1140 Type 2 diabetes mellitus with diabetic neuropathy, unspecified: Secondary | ICD-10-CM | POA: Insufficient documentation

## 2019-09-12 DIAGNOSIS — F418 Other specified anxiety disorders: Secondary | ICD-10-CM | POA: Diagnosis not present

## 2019-09-12 DIAGNOSIS — M1990 Unspecified osteoarthritis, unspecified site: Secondary | ICD-10-CM | POA: Diagnosis not present

## 2019-09-12 DIAGNOSIS — E785 Hyperlipidemia, unspecified: Secondary | ICD-10-CM | POA: Diagnosis not present

## 2019-09-12 DIAGNOSIS — F319 Bipolar disorder, unspecified: Secondary | ICD-10-CM | POA: Diagnosis not present

## 2019-09-12 DIAGNOSIS — G4730 Sleep apnea, unspecified: Secondary | ICD-10-CM | POA: Insufficient documentation

## 2019-09-12 DIAGNOSIS — F332 Major depressive disorder, recurrent severe without psychotic features: Secondary | ICD-10-CM

## 2019-09-12 LAB — GLUCOSE, CAPILLARY: Glucose-Capillary: 88 mg/dL (ref 70–99)

## 2019-09-12 MED ORDER — ONDANSETRON HCL 4 MG/2ML IJ SOLN: 4.0000 mg | Freq: Once | INTRAMUSCULAR | Status: DC | PRN

## 2019-09-12 MED ORDER — DEXTROSE 5 % IV SOLN: 500.0000 mL | Freq: Once | INTRAVENOUS | Status: DC

## 2019-09-12 MED ORDER — FENTANYL CITRATE (PF) 100 MCG/2ML IJ SOLN: 25.0000 ug | INTRAMUSCULAR | Status: DC | PRN

## 2019-09-12 MED ORDER — GLYCOPYRROLATE 0.2 MG/ML IJ SOLN
INTRAMUSCULAR | Status: AC
  Filled 2019-09-12: qty 1

## 2019-09-12 MED ORDER — GLYCOPYRROLATE 0.2 MG/ML IJ SOLN
0.2000 mg | Freq: Once | INTRAMUSCULAR | Status: AC
  Administered 2019-09-12: 0.2 mg via INTRAVENOUS

## 2019-09-12 MED ORDER — LABETALOL HCL 5 MG/ML IV SOLN
INTRAVENOUS | Status: AC
  Filled 2019-09-12: qty 4

## 2019-09-12 MED ORDER — MIDAZOLAM HCL 2 MG/2ML IJ SOLN
INTRAMUSCULAR | Status: AC
  Filled 2019-09-12: qty 2

## 2019-09-12 MED ORDER — SUCCINYLCHOLINE CHLORIDE 20 MG/ML IJ SOLN
INTRAMUSCULAR | Status: AC
  Filled 2019-09-12: qty 1

## 2019-09-12 MED ORDER — MIDAZOLAM HCL 2 MG/2ML IJ SOLN
INTRAMUSCULAR | Status: DC | PRN
  Administered 2019-09-12: 2 mg via INTRAVENOUS

## 2019-09-12 MED ORDER — METHOHEXITAL SODIUM 100 MG/10ML IV SOSY
PREFILLED_SYRINGE | INTRAVENOUS | Status: DC | PRN
  Administered 2019-09-12: 70 mg via INTRAVENOUS

## 2019-09-12 MED ORDER — LABETALOL HCL 5 MG/ML IV SOLN
INTRAVENOUS | Status: DC | PRN
  Administered 2019-09-12: 10 mg via INTRAVENOUS

## 2019-09-12 MED ORDER — SUCCINYLCHOLINE CHLORIDE 20 MG/ML IJ SOLN
INTRAMUSCULAR | Status: DC | PRN
  Administered 2019-09-12: 80 mg via INTRAVENOUS

## 2019-09-12 MED ORDER — DEXTROSE-NACL 5-0.45 % IV SOLN: INTRAVENOUS | Status: DC

## 2019-09-12 NOTE — Anesthesia Postprocedure Evaluation (Signed)
Anesthesia Post Note  Patient: Amy [name]  Procedure(s) Performed: ECT TX  Patient location during evaluation: PACU Anesthesia Type: General Level of consciousness: awake and alert and oriented Pain management: pain level controlled Vital Signs Assessment: post-procedure vital signs reviewed and stable Respiratory status: spontaneous breathing Cardiovascular status: blood pressure returned to baseline Anesthetic complications: no     Last Vitals:  Vitals:   09/12/19 1131 09/12/19 1147  BP: 134/85 129/84  Pulse: 80 74  Resp: 18 18  Temp: 36.9 C   SpO2: 100% 100%    Last Pain:  Vitals:   09/12/19 1150  PainSc: 0-No pain                 Joanell Cressler

## 2019-09-12 NOTE — Anesthesia Preprocedure Evaluation (Signed)
Anesthesia Evaluation  Patient identified by MRN, date of birth, ID band Patient awake    Reviewed: Allergy & Precautions, H&P , NPO status , Patient's Chart, lab work & pertinent test results  Airway Mallampati: II  TM Distance: >3 FB Neck ROM: full    Dental  (+) Implants   Pulmonary neg shortness of breath, sleep apnea , neg COPD, neg recent URI, Not current smoker,           Cardiovascular hypertension, (-) angina(-) Cardiac Stents (-) dysrhythmias      Neuro/Psych PSYCHIATRIC DISORDERS Anxiety Depression Bipolar Disorder Diabetic neuropathy TIA   GI/Hepatic Neg liver ROS, GERD  Controlled,  Endo/Other  diabetes  Renal/GU negative Renal ROS  negative genitourinary   Musculoskeletal  (+) Arthritis ,   Abdominal   Peds  Hematology negative hematology ROS (+) anemia ,   Anesthesia Other Findings Past Medical History: No date: ADHD (attention deficit hyperactivity disorder) No date: Anemia No date: Anxiety No date: Bipolar disorder ([place]) 09/2014: Carpal tunnel syndrome of right wrist No date: Cataract, immature No date: Depression No date: Diabetic neuropathy (HCC)     Comment:  feet No date: Dyslipidemia No date: GERD (gastroesophageal reflux disease) No date: Hypertension     Comment:  states under control with med., has been on med. x 20               yrs. No date: Insulin dependent diabetes mellitus No date: Major depressive disorder No date: Osteoarthritis     Comment:  knees No date: SLEEP APNEA     Comment:  uses CPAP nightly No date: Vitamin D deficiency  Past Surgical History: No date: BREAST REDUCTION SURGERY; Bilateral 10/08/2014: CARPAL TUNNEL RELEASE; Right     Comment:  Procedure: RIGHT CARPAL TUNNEL RELEASE;  Surgeon: Daryll Brod, MD;  Location: [place];                Service: Orthopedics;  Laterality: Right; 10/27/2013: COLONOSCOPY WITH  PROPOFOL 02/03/2004: ESOPHAGOGASTRODUODENOSCOPY 12/05/2005: LAPAROSCOPIC GASTRIC BANDING     Reproductive/Obstetrics negative OB ROS                             Anesthesia Physical  Anesthesia Plan  ASA: III  Anesthesia Plan: General   Post-op Pain Management:    Induction:   PONV Risk Score and Plan:   Airway Management Planned: Mask  Additional Equipment:   Intra-op Plan:   Post-operative Plan:   Informed Consent: I have reviewed the patients History and Physical, chart, labs and discussed the procedure including the risks, benefits and alternatives for the proposed anesthesia with the patient or authorized representative who has indicated his/her understanding and acceptance.     Dental Advisory Given  Plan Discussed with: Anesthesiologist, CRNA and Surgeon  Anesthesia Plan Comments:         Anesthesia Quick Evaluation

## 2019-09-12 NOTE — Procedures (Signed)
ECT SERVICES Physician's Interval Evaluation & Treatment Note  Patient Identification: Amy [name] MRN:  [id number] Date of Evaluation:  09/12/2019 TX #: 8  MADRS:   MMSE:   P.E. Findings:  No change physical exam  Psychiatric Interval Note:  Mood is reported as being good with no return of depression  Subjective:  Patient is a 64 y.o. female seen for evaluation for Electroconvulsive Therapy. No specific complaints  Treatment Summary:   [x]   Right Unilateral             []  Bilateral   % Energy : 0.3 ms 60%   Impedance: 2250 ohms  Seizure Energy Index: 6372 V squared  Postictal Suppression Index: 58%  Seizure Concordance Index: 97%  Medications  Pre Shock: Robinul 0.2 mg Toradol 30 mg Brevital 70 mg succinylcholine 80 mg  Post Shock: Versed 2 mg  Seizure Duration: 64 seconds by EMG 113 seconds by EEG   Comments: Follow-up in about 4 weeks  Lungs:  [x]   Clear to auscultation               []  Other:   Heart:    [x]   Regular rhythm             []  irregular rhythm    [x]   Previous H&P reviewed, patient examined and there are NO CHANGES                 []   Previous H&P reviewed, patient examined and there are changes noted.   [name], MD 1/15/202110:54 AM

## 2019-09-12 NOTE — Discharge Instructions (Signed)
1)  The drugs that you have been given will stay in your system until tomorrow so for the       next 24 hours you should not:  A. Drive an automobile  B. Make any legal decisions  C. Drink any alcoholic beverages  2)  You may resume your regular meals upon return home.  3)  A responsible adult must take you home.  Someone should stay with you for a few          hours, then be available by phone for the remainder of the treatment day.  4)  You May experience any of the following symptoms:  Headache, Nausea and a dry mouth (due to the medications you were given),  temporary memory loss and some confusion, or sore muscles (a warm bath  should help this).  If you you experience any of these symptoms let us know on                your return visit.  5)  Report any of the following: any acute discomfort, severe headache, or temperature        greater than 100.5 F.   Also report any unusual redness, swelling, drainage, or pain         at your IV site.    You may report Symptoms to:  [place] [facility]          Phone: [phone], ECT Department           or Dr. Prescott Gum office [phone]  6)  Your next ECT Treatment is Monday, February 22  We will call 2 days prior to your scheduled appointment for arrival times.  7)  Nothing to eat or drink after midnight the night before your procedure.  8)  Take morning meds with a sip of water the morning of your procedure.  9)  Other Instructions: Call [phone] to cancel the morning of your procedure due         to illness or emergency.  10) We will call within 72 hours to assess how you are feeling.

## 2019-09-12 NOTE — Anesthesia Procedure Notes (Signed)
Date/Time: 09/12/2019 11:02 AM Performed by: Caryl Asp, CRNA Pre-anesthesia Checklist: Patient identified, Emergency Drugs available, Suction available and Patient being monitored Patient Re-evaluated:Patient Re-evaluated prior to induction Oxygen Delivery Method: Circle system utilized Preoxygenation: Pre-oxygenation with 100% oxygen Induction Type: IV induction Ventilation: Mask ventilation without difficulty and Mask ventilation throughout procedure Airway Equipment and Method: Bite block Placement Confirmation: positive ETCO2 Dental Injury: Teeth and Oropharynx as per pre-operative assessment

## 2019-09-12 NOTE — Transfer of Care (Signed)
Immediate Anesthesia Transfer of Care Note  Patient: Amy [name]  Procedure(s) Performed: ECT TX  Patient Location: PACU  Anesthesia Type:General  Level of Consciousness: sedated  Airway & Oxygen Therapy: Patient Spontanous Breathing and Patient connected to face mask oxygen  Post-op Assessment: Report given to RN and Post -op Vital signs reviewed and stable  Post vital signs: Reviewed and stable  Last Vitals:  Vitals Value Taken Time  BP    Temp    Pulse    Resp    SpO2      Last Pain:  Vitals:   09/12/19 0858  PainSc: 0-No pain         Complications: No apparent anesthesia complications

## 2019-09-12 NOTE — H&P (Signed)
Amy [name] is an 64 y.o. female.   Chief Complaint:  Stable. Only breif and mild depression Severe chronic depression HPI: severe chronic depression  Past Medical History:  Diagnosis Date  . ADHD (attention deficit hyperactivity disorder)   . Anemia   . Anxiety   . Bipolar disorder ([place])   . Carpal tunnel syndrome of right wrist 09/2014  . Cataract, immature   . Depression   . Diabetic neuropathy (HCC)    feet  . Dyslipidemia   . GERD (gastroesophageal reflux disease)   . Hypertension    states under control with med., has been on med. x 20 yrs.  . Insulin dependent diabetes mellitus   . Major depressive disorder   . Osteoarthritis    knees  . SLEEP APNEA    uses CPAP nightly  . Vitamin D deficiency     Past Surgical History:  Procedure Laterality Date  . BREAST REDUCTION SURGERY Bilateral   . CARPAL TUNNEL RELEASE Right 10/08/2014   Procedure: RIGHT CARPAL TUNNEL RELEASE;  Surgeon: Daryll Brod, MD;  Location: [place];  Service: Orthopedics;  Laterality: Right;  . COLONOSCOPY WITH PROPOFOL  10/27/2013  . ESOPHAGOGASTRODUODENOSCOPY  02/03/2004  . LAPAROSCOPIC GASTRIC BANDING  12/05/2005    Family History  Problem Relation Age of Onset  . Heart disease Father   . Heart attack Father   . Breast cancer Sister   . Diabetes Sister   . Sarcoidosis Brother   . Cancer Brother        prostate  . Heart failure Mother   . Colon cancer Neg Hx   . Dementia Neg Hx    Social History:  reports that she has never smoked. She has never used smokeless tobacco. She reports that she does not drink alcohol or use drugs.  Allergies: No Known Allergies  (Not in a hospital admission)   Results for orders placed or performed during the hospital encounter of 09/12/19 (from the past 48 hour(s))  Glucose, capillary     Status: None   Collection Time: 09/12/19  8:51 AM  Result Value Ref Range   Glucose-Capillary 88 70 - 99 mg/dL   No results found.  Review of  Systems  Constitutional: Negative.   HENT: Negative.   Eyes: Negative.   Respiratory: Negative.   Cardiovascular: Negative.   Gastrointestinal: Negative.   Musculoskeletal: Negative.   Skin: Negative.   Neurological: Negative.   Psychiatric/Behavioral: Negative.     Blood pressure 137/79, pulse 86, temperature 98.7 F (37.1 C), resp. rate 18, height 5\' 5"  (1.651 m), weight 74.4 kg, SpO2 100 %. Physical Exam  Constitutional: She appears well-developed and well-nourished.  HENT:  Head: Normocephalic and atraumatic.  Eyes: Pupils are equal, round, and reactive to light. Conjunctivae are normal.  Cardiovascular: Normal heart sounds.  Respiratory: Effort normal.  GI: Soft.  Musculoskeletal:        General: Normal range of motion.     Cervical back: Normal range of motion.  Neurological: She is alert.  Skin: Skin is warm and dry.  Psychiatric: She has a normal mood and affect. Her behavior is normal. Judgment and thought content normal.     Assessment/Plan Continue maintenance at about 4 week interval  [name], MD 09/12/2019, 10:03 AM

## 2019-09-17 DIAGNOSIS — F332 Major depressive disorder, recurrent severe without psychotic features: Secondary | ICD-10-CM | POA: Diagnosis not present

## 2019-09-24 DIAGNOSIS — F332 Major depressive disorder, recurrent severe without psychotic features: Secondary | ICD-10-CM | POA: Diagnosis not present

## 2019-10-01 DIAGNOSIS — F332 Major depressive disorder, recurrent severe without psychotic features: Secondary | ICD-10-CM | POA: Diagnosis not present

## 2019-10-08 DIAGNOSIS — F332 Major depressive disorder, recurrent severe without psychotic features: Secondary | ICD-10-CM | POA: Diagnosis not present

## 2019-10-15 ENCOUNTER — Other Ambulatory Visit: Payer: Self-pay

## 2019-10-15 ENCOUNTER — Other Ambulatory Visit
Admission: RE | Admit: 2019-10-15 | Discharge: 2019-10-15 | Disposition: A | Discharge status: Home / Self Care | Payer: Federal, State, Local not specified - PPO | Source: Ambulatory Visit | Attending: Psychiatry | Admitting: Psychiatry

## 2019-10-15 ENCOUNTER — Telehealth: Payer: Self-pay

## 2019-10-15 DIAGNOSIS — Z01812 Encounter for preprocedural laboratory examination: Secondary | ICD-10-CM | POA: Diagnosis not present

## 2019-10-15 DIAGNOSIS — Z20822 Contact with and (suspected) exposure to covid-19: Secondary | ICD-10-CM | POA: Diagnosis not present

## 2019-10-15 DIAGNOSIS — F332 Major depressive disorder, recurrent severe without psychotic features: Secondary | ICD-10-CM | POA: Diagnosis not present

## 2019-10-16 LAB — SARS CORONAVIRUS 2 (TAT 6-24 HRS): SARS Coronavirus 2: NEGATIVE

## 2019-10-17 ENCOUNTER — Other Ambulatory Visit: Payer: Self-pay | Admitting: Psychiatry

## 2019-10-20 ENCOUNTER — Encounter
Admission: RE | Admit: 2019-10-20 | Discharge: 2019-10-20 | Disposition: A | Discharge status: Home / Self Care | Payer: Federal, State, Local not specified - PPO | Source: Ambulatory Visit | Attending: Psychiatry | Admitting: Psychiatry

## 2019-10-20 ENCOUNTER — Other Ambulatory Visit: Payer: Self-pay

## 2019-10-20 ENCOUNTER — Ambulatory Visit: Payer: Self-pay | Admitting: Anesthesiology

## 2019-10-20 ENCOUNTER — Encounter: Payer: Self-pay | Admitting: Anesthesiology

## 2019-10-20 DIAGNOSIS — Z794 Long term (current) use of insulin: Secondary | ICD-10-CM | POA: Insufficient documentation

## 2019-10-20 DIAGNOSIS — E785 Hyperlipidemia, unspecified: Secondary | ICD-10-CM | POA: Insufficient documentation

## 2019-10-20 DIAGNOSIS — G4730 Sleep apnea, unspecified: Secondary | ICD-10-CM | POA: Diagnosis not present

## 2019-10-20 DIAGNOSIS — F319 Bipolar disorder, unspecified: Secondary | ICD-10-CM | POA: Insufficient documentation

## 2019-10-20 DIAGNOSIS — E1140 Type 2 diabetes mellitus with diabetic neuropathy, unspecified: Secondary | ICD-10-CM | POA: Diagnosis not present

## 2019-10-20 DIAGNOSIS — E1136 Type 2 diabetes mellitus with diabetic cataract: Secondary | ICD-10-CM | POA: Insufficient documentation

## 2019-10-20 DIAGNOSIS — F332 Major depressive disorder, recurrent severe without psychotic features: Secondary | ICD-10-CM | POA: Diagnosis not present

## 2019-10-20 DIAGNOSIS — R413 Other amnesia: Secondary | ICD-10-CM | POA: Insufficient documentation

## 2019-10-20 DIAGNOSIS — M1990 Unspecified osteoarthritis, unspecified site: Secondary | ICD-10-CM | POA: Insufficient documentation

## 2019-10-20 DIAGNOSIS — I10 Essential (primary) hypertension: Secondary | ICD-10-CM | POA: Insufficient documentation

## 2019-10-20 DIAGNOSIS — F418 Other specified anxiety disorders: Secondary | ICD-10-CM | POA: Diagnosis not present

## 2019-10-20 DIAGNOSIS — K219 Gastro-esophageal reflux disease without esophagitis: Secondary | ICD-10-CM | POA: Diagnosis not present

## 2019-10-20 MED ORDER — SODIUM CHLORIDE 0.9 % IV SOLN
500.0000 mL | Freq: Once | INTRAVENOUS | Status: AC
  Administered 2019-10-20: 10:00:00 500 mL via INTRAVENOUS

## 2019-10-20 MED ORDER — LABETALOL HCL 5 MG/ML IV SOLN
INTRAVENOUS | Status: DC | PRN
  Administered 2019-10-20: 10 mg via INTRAVENOUS

## 2019-10-20 MED ORDER — METHOHEXITAL SODIUM 100 MG/10ML IV SOSY
PREFILLED_SYRINGE | INTRAVENOUS | Status: DC | PRN
  Administered 2019-10-20: 70 mg via INTRAVENOUS

## 2019-10-20 MED ORDER — SODIUM CHLORIDE 0.9 % IV SOLN: INTRAVENOUS | Status: DC | PRN

## 2019-10-20 MED ORDER — SUCCINYLCHOLINE CHLORIDE 20 MG/ML IJ SOLN
INTRAMUSCULAR | Status: DC | PRN
  Administered 2019-10-20: 80 mg via INTRAVENOUS

## 2019-10-20 MED ORDER — LABETALOL HCL 5 MG/ML IV SOLN
INTRAVENOUS | Status: AC
  Filled 2019-10-20: qty 4

## 2019-10-20 MED ORDER — MIDAZOLAM HCL 2 MG/2ML IJ SOLN
2.0000 mg | Freq: Once | INTRAMUSCULAR | Status: AC
  Administered 2019-10-20: 2 mg via INTRAVENOUS

## 2019-10-20 MED ORDER — METHOHEXITAL SODIUM 0.5 G IJ SOLR
INTRAMUSCULAR | Status: AC
  Filled 2019-10-20: qty 500

## 2019-10-20 MED ORDER — GLYCOPYRROLATE 0.2 MG/ML IJ SOLN
INTRAMUSCULAR | Status: AC
  Administered 2019-10-20: 10:00:00 0.2 mg via INTRAVENOUS
  Filled 2019-10-20: qty 1

## 2019-10-20 MED ORDER — MIDAZOLAM HCL 2 MG/2ML IJ SOLN
INTRAMUSCULAR | Status: AC
  Filled 2019-10-20: qty 2

## 2019-10-20 MED ORDER — KETOROLAC TROMETHAMINE 30 MG/ML IJ SOLN: 30.0000 mg | Freq: Once | INTRAMUSCULAR | Status: AC

## 2019-10-20 MED ORDER — GLYCOPYRROLATE 0.2 MG/ML IJ SOLN: 0.2000 mg | Freq: Once | INTRAMUSCULAR | Status: AC

## 2019-10-20 MED ORDER — KETOROLAC TROMETHAMINE 30 MG/ML IJ SOLN
INTRAMUSCULAR | Status: AC
  Administered 2019-10-20: 10:00:00 30 mg via INTRAVENOUS
  Filled 2019-10-20: qty 1

## 2019-10-20 MED ORDER — SUCCINYLCHOLINE CHLORIDE 20 MG/ML IJ SOLN
INTRAMUSCULAR | Status: AC
  Filled 2019-10-20: qty 1

## 2019-10-20 NOTE — H&P (Signed)
Amy [name] is an 64 y.o. female.   Chief Complaint: still aware of short term memory loss HPI: recurrent depression  Past Medical History:  Diagnosis Date  . ADHD (attention deficit hyperactivity disorder)   . Anemia   . Anxiety   . Bipolar disorder ([place])   . Carpal tunnel syndrome of right wrist 09/2014  . Cataract, immature   . Depression   . Diabetic neuropathy (HCC)    feet  . Dyslipidemia   . GERD (gastroesophageal reflux disease)   . Hypertension    states under control with med., has been on med. x 20 yrs.  . Insulin dependent diabetes mellitus   . Major depressive disorder   . Osteoarthritis    knees  . SLEEP APNEA    uses CPAP nightly  . Vitamin D deficiency     Past Surgical History:  Procedure Laterality Date  . BREAST REDUCTION SURGERY Bilateral   . CARPAL TUNNEL RELEASE Right 10/08/2014   Procedure: RIGHT CARPAL TUNNEL RELEASE;  Surgeon: Daryll Brod, MD;  Location: [place];  Service: Orthopedics;  Laterality: Right;  . COLONOSCOPY WITH PROPOFOL  10/27/2013  . ESOPHAGOGASTRODUODENOSCOPY  02/03/2004  . LAPAROSCOPIC GASTRIC BANDING  12/05/2005    Family History  Problem Relation Age of Onset  . Heart disease Father   . Heart attack Father   . Breast cancer Sister   . Diabetes Sister   . Sarcoidosis Brother   . Cancer Brother        prostate  . Heart failure Mother   . Colon cancer Neg Hx   . Dementia Neg Hx    Social History:  reports that she has never smoked. She has never used smokeless tobacco. She reports that she does not drink alcohol or use drugs.  Allergies: No Known Allergies  (Not in a hospital admission)   No results found for this or any previous visit (from the past 48 hour(s)). No results found.  Review of Systems  Constitutional: Negative.   HENT: Negative.   Eyes: Negative.   Respiratory: Negative.   Cardiovascular: Negative.   Gastrointestinal: Negative.   Musculoskeletal: Negative.   Skin: Negative.    Neurological: Negative.   Psychiatric/Behavioral: Negative.     Blood pressure 115/75, pulse 66, temperature 98.4 F (36.9 C), temperature source Oral, resp. rate 18, SpO2 98 %. Physical Exam  Nursing note and vitals reviewed. Constitutional: She appears well-developed and well-nourished.  HENT:  Head: Normocephalic and atraumatic.  Eyes: Pupils are equal, round, and reactive to light. Conjunctivae are normal.  Cardiovascular: Regular rhythm and normal heart sounds.  Respiratory: Effort normal.  GI: Soft.  Musculoskeletal:        General: Normal range of motion.     Cervical back: Normal range of motion.  Neurological: She is alert.  Skin: Skin is warm and dry.  Psychiatric: She has a normal mood and affect. Her behavior is normal. Judgment and thought content normal.     Assessment/Plan We will schedule next tx in 6 weeks to give longer recovery. Otherwise she is doing well  [name], MD 10/20/2019, 9:55 AM

## 2019-10-20 NOTE — Transfer of Care (Signed)
Immediate Anesthesia Transfer of Care Note  Patient: Amy [name]  Procedure(s) Performed: ECT TX  Patient Location: PACU  Anesthesia Type:General  Level of Consciousness: drowsy and patient cooperative  Airway & Oxygen Therapy: Patient Spontanous Breathing and Patient connected to nasal cannula oxygen  Post-op Assessment: Report given to RN and Post -op Vital signs reviewed and stable  Post vital signs: Reviewed and stable  Last Vitals:  Vitals Value Taken Time  BP 90/69 10/20/19 1036  Temp 36.6 C 10/20/19 1036  Pulse 85 10/20/19 1038  Resp 13 10/20/19 1038  SpO2 92 % 10/20/19 1038  Vitals shown include unvalidated device data.  Last Pain:  Vitals:   10/20/19 0933  TempSrc:   PainSc: 0-No pain         Complications: No apparent anesthesia complications

## 2019-10-20 NOTE — Anesthesia Postprocedure Evaluation (Signed)
Anesthesia Post Note  Patient: Amy [name]  Procedure(s) Performed: ECT TX  Patient location during evaluation: PACU Anesthesia Type: General Level of consciousness: awake and alert Pain management: pain level controlled Vital Signs Assessment: post-procedure vital signs reviewed and stable Respiratory status: spontaneous breathing, nonlabored ventilation, respiratory function stable and patient connected to nasal cannula oxygen Cardiovascular status: blood pressure returned to baseline and stable Postop Assessment: no apparent nausea or vomiting Anesthetic complications: no     Last Vitals:  Vitals:   10/20/19 1106 10/20/19 1120  BP: 95/67 110/72  Pulse: 86 80  Resp: 14 14  Temp:  36.7 C  SpO2: 94% 95%    Last Pain:  Vitals:   10/20/19 1120  TempSrc:   PainSc: 0-No pain                 Martha Clan

## 2019-10-20 NOTE — Anesthesia Preprocedure Evaluation (Signed)
Anesthesia Evaluation  Patient identified by MRN, date of birth, ID band Patient awake    Reviewed: Allergy & Precautions, H&P , NPO status , Patient's Chart, lab work & pertinent test results  Airway Mallampati: II  TM Distance: >3 FB Neck ROM: full    Dental  (+) Implants   Pulmonary neg shortness of breath, sleep apnea , neg COPD, neg recent URI, Not current smoker,           Cardiovascular hypertension, (-) angina(-) Cardiac Stents (-) dysrhythmias      Neuro/Psych PSYCHIATRIC DISORDERS Anxiety Depression Bipolar Disorder Diabetic neuropathy TIA   GI/Hepatic Neg liver ROS, GERD  Controlled,  Endo/Other  diabetes  Renal/GU negative Renal ROS  negative genitourinary   Musculoskeletal  (+) Arthritis ,   Abdominal   Peds  Hematology  (+) Blood dyscrasia, anemia ,   Anesthesia Other Findings Past Medical History: No date: ADHD (attention deficit hyperactivity disorder) No date: Anemia No date: Anxiety No date: Bipolar disorder ([place]) 09/2014: Carpal tunnel syndrome of right wrist No date: Cataract, immature No date: Depression No date: Diabetic neuropathy (HCC)     Comment:  feet No date: Dyslipidemia No date: GERD (gastroesophageal reflux disease) No date: Hypertension     Comment:  states under control with med., has been on med. x 20               yrs. No date: Insulin dependent diabetes mellitus No date: Major depressive disorder No date: Osteoarthritis     Comment:  knees No date: SLEEP APNEA     Comment:  uses CPAP nightly No date: Vitamin D deficiency  Past Surgical History: No date: BREAST REDUCTION SURGERY; Bilateral 10/08/2014: CARPAL TUNNEL RELEASE; Right     Comment:  Procedure: RIGHT CARPAL TUNNEL RELEASE;  Surgeon: Daryll Brod, MD;  Location: [place];                Service: Orthopedics;  Laterality: Right; 10/27/2013: COLONOSCOPY WITH PROPOFOL 02/03/2004:  ESOPHAGOGASTRODUODENOSCOPY 12/05/2005: LAPAROSCOPIC GASTRIC BANDING     Reproductive/Obstetrics negative OB ROS                             Anesthesia Physical  Anesthesia Plan  ASA: III  Anesthesia Plan: General   Post-op Pain Management:    Induction:   PONV Risk Score and Plan:   Airway Management Planned: Mask  Additional Equipment:   Intra-op Plan:   Post-operative Plan:   Informed Consent: I have reviewed the patients History and Physical, chart, labs and discussed the procedure including the risks, benefits and alternatives for the proposed anesthesia with the patient or authorized representative who has indicated his/her understanding and acceptance.     Dental Advisory Given  Plan Discussed with: Anesthesiologist, CRNA and Surgeon  Anesthesia Plan Comments:         Anesthesia Quick Evaluation

## 2019-10-20 NOTE — Discharge Instructions (Signed)
1)  The drugs that you have been given will stay in your system until tomorrow so for the       next 24 hours you should not:  A. Drive an automobile  B. Make any legal decisions  C. Drink any alcoholic beverages  2)  You may resume your regular meals upon return home.  3)  A responsible adult must take you home.  Someone should stay with you for a few          hours, then be available by phone for the remainder of the treatment day.  4)  You May experience any of the following symptoms:  Headache, Nausea and a dry mouth (due to the medications you were given),  temporary memory loss and some confusion, or sore muscles (a warm bath  should help this).  If you you experience any of these symptoms let us know on                your return visit.  5)  Report any of the following: any acute discomfort, severe headache, or temperature        greater than 100.5 F.   Also report any unusual redness, swelling, drainage, or pain         at your IV site.    You may report Symptoms to:  [place] at [facility]          Phone: [phone], ECT Department           or Dr. Prescott Gum office [phone]  6)  Your next ECT Treatment is Monday April 12   We will call 2 days prior to your scheduled appointment for arrival times.  7)  Nothing to eat or drink after midnight the night before your procedure.  8)  Take with a sip of water the morning of your procedure.  9)  Other Instructions: Call [phone] to cancel the morning of your procedure due         to illness or emergency.  10) We will call within 72 hours to assess how you are feeling.

## 2019-10-20 NOTE — Procedures (Signed)
ECT SERVICES Physician's Interval Evaluation & Treatment Note  Patient Identification: Amy [name] MRN:  [id number] Date of Evaluation:  10/20/2019 TX #: 9  MADRS:   MMSE:   P.E. Findings:  No change physical exam  Psychiatric Interval Note:  Mood is feeling good no return of depression.  She does have some short-term memory impairment still present  Subjective:  Patient is a 64 y.o. female seen for evaluation for Electroconvulsive Therapy. Aware of still having some short-term memory problems.  Treatment Summary:   [x]   Right Unilateral             []  Bilateral   % Energy : 0.3 ms 60%   Impedance: 2070 ohms  Seizure Energy Index: 10,313 V squared  Postictal Suppression Index: 50%  Seizure Concordance Index: 92%  Medications  Pre Shock: Robinul 0.2 mg Toradol 30 mg Brevital 70 mg succinylcholine 80 mg  Post Shock: Labetalol 10 mg Versed 2 mg  Seizure Duration: 146 by my reading of the movement, unknown exactly where the EEG ended the machine read it as being too short it was probably more like 180 seconds.   Comments: We will see her back in about 6 or 7 weeks this time.  Lungs:  [x]   Clear to auscultation               []  Other:   Heart:    [x]   Regular rhythm             []  irregular rhythm    [x]   Previous H&P reviewed, patient examined and there are NO CHANGES                 []   Previous H&P reviewed, patient examined and there are changes noted.   Amy Berthold, MD 2/22/202110:30 AM

## 2019-10-22 DIAGNOSIS — F332 Major depressive disorder, recurrent severe without psychotic features: Secondary | ICD-10-CM | POA: Diagnosis not present

## 2019-10-29 DIAGNOSIS — F332 Major depressive disorder, recurrent severe without psychotic features: Secondary | ICD-10-CM | POA: Diagnosis not present

## 2019-11-05 DIAGNOSIS — F332 Major depressive disorder, recurrent severe without psychotic features: Secondary | ICD-10-CM | POA: Diagnosis not present

## 2019-11-12 DIAGNOSIS — F332 Major depressive disorder, recurrent severe without psychotic features: Secondary | ICD-10-CM | POA: Diagnosis not present

## 2019-11-14 ENCOUNTER — Ambulatory Visit: Payer: Federal, State, Local not specified - PPO | Attending: Internal Medicine

## 2019-11-14 DIAGNOSIS — Z23 Encounter for immunization: Secondary | ICD-10-CM

## 2019-11-14 NOTE — Progress Notes (Signed)
   Covid-19 Vaccination Clinic  Name:  Amy [name]    MRN: [id number] DOB: [birth year]  11/14/2019  Amy [name] was observed post Covid-19 immunization for 15 minutes without incident. She was provided with Vaccine Information Sheet and instruction to access the V-Safe system.   Amy [name] was instructed to call 911 with any severe reactions post vaccine: Marland Kitchen Difficulty breathing  . Swelling of face and throat  . A fast heartbeat  . A bad rash all over body  . Dizziness and weakness   Immunizations Administered    Name Date Dose VIS Date Route   Pfizer COVID-19 Vaccine 11/14/2019 10:17 AM 0.3 mL 08/08/2019 Intramuscular   Manufacturer: [place]   Lot: [id number]   [place]: [id number]

## 2019-11-17 DIAGNOSIS — F3175 Bipolar disorder, in partial remission, most recent episode depressed: Secondary | ICD-10-CM | POA: Diagnosis not present

## 2019-11-17 DIAGNOSIS — I10 Essential (primary) hypertension: Secondary | ICD-10-CM | POA: Diagnosis not present

## 2019-11-17 DIAGNOSIS — E1140 Type 2 diabetes mellitus with diabetic neuropathy, unspecified: Secondary | ICD-10-CM | POA: Diagnosis not present

## 2019-11-17 DIAGNOSIS — M25472 Effusion, left ankle: Secondary | ICD-10-CM | POA: Diagnosis not present

## 2019-11-17 DIAGNOSIS — E785 Hyperlipidemia, unspecified: Secondary | ICD-10-CM | POA: Diagnosis not present

## 2019-11-17 DIAGNOSIS — M25471 Effusion, right ankle: Secondary | ICD-10-CM | POA: Diagnosis not present

## 2019-11-18 ENCOUNTER — Encounter: Payer: Self-pay | Admitting: Gastroenterology

## 2019-11-19 DIAGNOSIS — F332 Major depressive disorder, recurrent severe without psychotic features: Secondary | ICD-10-CM | POA: Diagnosis not present

## 2019-11-26 DIAGNOSIS — M79672 Pain in left foot: Secondary | ICD-10-CM | POA: Diagnosis not present

## 2019-11-26 DIAGNOSIS — M19011 Primary osteoarthritis, right shoulder: Secondary | ICD-10-CM | POA: Diagnosis not present

## 2019-11-26 DIAGNOSIS — M79642 Pain in left hand: Secondary | ICD-10-CM | POA: Diagnosis not present

## 2019-11-26 DIAGNOSIS — R768 Other specified abnormal immunological findings in serum: Secondary | ICD-10-CM | POA: Diagnosis not present

## 2019-11-26 DIAGNOSIS — M25571 Pain in right ankle and joints of right foot: Secondary | ICD-10-CM | POA: Diagnosis not present

## 2019-11-26 DIAGNOSIS — M1990 Unspecified osteoarthritis, unspecified site: Secondary | ICD-10-CM | POA: Diagnosis not present

## 2019-11-26 DIAGNOSIS — M25512 Pain in left shoulder: Secondary | ICD-10-CM | POA: Diagnosis not present

## 2019-11-26 DIAGNOSIS — M79671 Pain in right foot: Secondary | ICD-10-CM | POA: Diagnosis not present

## 2019-11-26 DIAGNOSIS — M4802 Spinal stenosis, cervical region: Secondary | ICD-10-CM | POA: Diagnosis not present

## 2019-11-26 DIAGNOSIS — M79641 Pain in right hand: Secondary | ICD-10-CM | POA: Diagnosis not present

## 2019-11-26 DIAGNOSIS — F332 Major depressive disorder, recurrent severe without psychotic features: Secondary | ICD-10-CM | POA: Diagnosis not present

## 2019-11-26 DIAGNOSIS — M19042 Primary osteoarthritis, left hand: Secondary | ICD-10-CM | POA: Diagnosis not present

## 2019-11-26 DIAGNOSIS — M25572 Pain in left ankle and joints of left foot: Secondary | ICD-10-CM | POA: Diagnosis not present

## 2019-11-26 DIAGNOSIS — M25552 Pain in left hip: Secondary | ICD-10-CM | POA: Diagnosis not present

## 2019-11-26 DIAGNOSIS — M25561 Pain in right knee: Secondary | ICD-10-CM | POA: Diagnosis not present

## 2019-11-26 DIAGNOSIS — M19072 Primary osteoarthritis, left ankle and foot: Secondary | ICD-10-CM | POA: Diagnosis not present

## 2019-11-26 DIAGNOSIS — M25562 Pain in left knee: Secondary | ICD-10-CM | POA: Diagnosis not present

## 2019-11-26 DIAGNOSIS — M25511 Pain in right shoulder: Secondary | ICD-10-CM | POA: Diagnosis not present

## 2019-11-26 DIAGNOSIS — M359 Systemic involvement of connective tissue, unspecified: Secondary | ICD-10-CM | POA: Diagnosis not present

## 2019-11-26 DIAGNOSIS — M19041 Primary osteoarthritis, right hand: Secondary | ICD-10-CM | POA: Diagnosis not present

## 2019-11-26 DIAGNOSIS — M19012 Primary osteoarthritis, left shoulder: Secondary | ICD-10-CM | POA: Diagnosis not present

## 2019-11-26 DIAGNOSIS — M7989 Other specified soft tissue disorders: Secondary | ICD-10-CM | POA: Diagnosis not present

## 2019-11-26 DIAGNOSIS — M19071 Primary osteoarthritis, right ankle and foot: Secondary | ICD-10-CM | POA: Diagnosis not present

## 2019-11-26 DIAGNOSIS — M25551 Pain in right hip: Secondary | ICD-10-CM | POA: Diagnosis not present

## 2019-11-26 DIAGNOSIS — M064 Inflammatory polyarthropathy: Secondary | ICD-10-CM | POA: Diagnosis not present

## 2019-12-03 ENCOUNTER — Telehealth: Payer: Self-pay

## 2019-12-03 DIAGNOSIS — F332 Major depressive disorder, recurrent severe without psychotic features: Secondary | ICD-10-CM | POA: Diagnosis not present

## 2019-12-05 ENCOUNTER — Other Ambulatory Visit: Payer: Self-pay

## 2019-12-05 ENCOUNTER — Other Ambulatory Visit
Admission: RE | Admit: 2019-12-05 | Discharge: 2019-12-05 | Disposition: A | Discharge status: Home / Self Care | Payer: Federal, State, Local not specified - PPO | Source: Ambulatory Visit | Attending: Psychiatry | Admitting: Psychiatry

## 2019-12-05 ENCOUNTER — Other Ambulatory Visit: Payer: Self-pay | Admitting: Psychiatry

## 2019-12-05 DIAGNOSIS — Z20822 Contact with and (suspected) exposure to covid-19: Secondary | ICD-10-CM | POA: Insufficient documentation

## 2019-12-05 DIAGNOSIS — Z01812 Encounter for preprocedural laboratory examination: Secondary | ICD-10-CM | POA: Diagnosis not present

## 2019-12-06 LAB — SARS CORONAVIRUS 2 (TAT 6-24 HRS): SARS Coronavirus 2: NEGATIVE

## 2019-12-08 ENCOUNTER — Encounter
Admission: RE | Admit: 2019-12-08 | Discharge: 2019-12-08 | Disposition: A | Discharge status: Home / Self Care | Payer: Federal, State, Local not specified - PPO | Source: Ambulatory Visit | Attending: Psychiatry | Admitting: Psychiatry

## 2019-12-08 ENCOUNTER — Encounter: Payer: Self-pay | Admitting: Certified Registered Nurse Anesthetist

## 2019-12-08 ENCOUNTER — Other Ambulatory Visit: Payer: Self-pay

## 2019-12-08 DIAGNOSIS — E1140 Type 2 diabetes mellitus with diabetic neuropathy, unspecified: Secondary | ICD-10-CM | POA: Diagnosis not present

## 2019-12-08 DIAGNOSIS — E785 Hyperlipidemia, unspecified: Secondary | ICD-10-CM | POA: Diagnosis not present

## 2019-12-08 DIAGNOSIS — Z794 Long term (current) use of insulin: Secondary | ICD-10-CM | POA: Diagnosis not present

## 2019-12-08 DIAGNOSIS — E1149 Type 2 diabetes mellitus with other diabetic neurological complication: Secondary | ICD-10-CM | POA: Diagnosis not present

## 2019-12-08 DIAGNOSIS — I10 Essential (primary) hypertension: Secondary | ICD-10-CM | POA: Insufficient documentation

## 2019-12-08 DIAGNOSIS — G4730 Sleep apnea, unspecified: Secondary | ICD-10-CM | POA: Insufficient documentation

## 2019-12-08 DIAGNOSIS — F319 Bipolar disorder, unspecified: Secondary | ICD-10-CM | POA: Insufficient documentation

## 2019-12-08 DIAGNOSIS — R413 Other amnesia: Secondary | ICD-10-CM | POA: Insufficient documentation

## 2019-12-08 DIAGNOSIS — E1136 Type 2 diabetes mellitus with diabetic cataract: Secondary | ICD-10-CM | POA: Diagnosis not present

## 2019-12-08 DIAGNOSIS — Z01812 Encounter for preprocedural laboratory examination: Secondary | ICD-10-CM | POA: Diagnosis not present

## 2019-12-08 DIAGNOSIS — F332 Major depressive disorder, recurrent severe without psychotic features: Secondary | ICD-10-CM | POA: Diagnosis not present

## 2019-12-08 DIAGNOSIS — M1990 Unspecified osteoarthritis, unspecified site: Secondary | ICD-10-CM | POA: Insufficient documentation

## 2019-12-08 DIAGNOSIS — Z20822 Contact with and (suspected) exposure to covid-19: Secondary | ICD-10-CM | POA: Diagnosis not present

## 2019-12-08 DIAGNOSIS — F418 Other specified anxiety disorders: Secondary | ICD-10-CM | POA: Diagnosis not present

## 2019-12-08 DIAGNOSIS — K219 Gastro-esophageal reflux disease without esophagitis: Secondary | ICD-10-CM | POA: Diagnosis not present

## 2019-12-08 LAB — GLUCOSE, CAPILLARY: Glucose-Capillary: 68 mg/dL — ABNORMAL LOW (ref 70–99)

## 2019-12-08 MED ORDER — SODIUM CHLORIDE 0.9 % IV SOLN
500.0000 mL | Freq: Once | INTRAVENOUS | Status: AC
  Administered 2019-12-08: 500 mL via INTRAVENOUS

## 2019-12-08 MED ORDER — GLYCOPYRROLATE 0.2 MG/ML IJ SOLN
0.2000 mg | Freq: Once | INTRAMUSCULAR | Status: AC
  Administered 2019-12-08: 0.2 mg via INTRAVENOUS

## 2019-12-08 MED ORDER — LABETALOL HCL 5 MG/ML IV SOLN
INTRAVENOUS | Status: DC | PRN
  Administered 2019-12-08: 10 mg via INTRAVENOUS

## 2019-12-08 MED ORDER — SUCCINYLCHOLINE CHLORIDE 20 MG/ML IJ SOLN
INTRAMUSCULAR | Status: DC | PRN
  Administered 2019-12-08: 80 mg via INTRAVENOUS

## 2019-12-08 MED ORDER — KETOROLAC TROMETHAMINE 30 MG/ML IJ SOLN
30.0000 mg | Freq: Once | INTRAMUSCULAR | Status: AC
  Administered 2019-12-08: 30 mg via INTRAVENOUS

## 2019-12-08 MED ORDER — MIDAZOLAM HCL 2 MG/2ML IJ SOLN
INTRAMUSCULAR | Status: DC | PRN
  Administered 2019-12-08: 2 mg via INTRAVENOUS

## 2019-12-08 MED ORDER — MIDAZOLAM HCL 2 MG/2ML IJ SOLN: 2.0000 mg | Freq: Once | INTRAMUSCULAR | Status: DC

## 2019-12-08 MED ORDER — METHOHEXITAL SODIUM 100 MG/10ML IV SOSY
PREFILLED_SYRINGE | INTRAVENOUS | Status: DC | PRN
  Administered 2019-12-08: 70 mg via INTRAVENOUS

## 2019-12-08 NOTE — Discharge Instructions (Signed)
1)  The drugs that you have been given will stay in your system until tomorrow so for the       next 24 hours you should not:  A. Drive an automobile  B. Make any legal decisions  C. Drink any alcoholic beverages  2)  You may resume your regular meals upon return home.  3)  A responsible adult must take you home.  Someone should stay with you for a few          hours, then be available by phone for the remainder of the treatment day.  4)  You May experience any of the following symptoms:  Headache, Nausea and a dry mouth (due to the medications you were given),  temporary memory loss and some confusion, or sore muscles (a warm bath  should help this).  If you you experience any of these symptoms let us know on                your return visit.  5)  Report any of the following: any acute discomfort, severe headache, or temperature        greater than 100.5 F.   Also report any unusual redness, swelling, drainage, or pain         at your IV site.    You may report Symptoms to:  [place] at [facility]          Phone: [phone], ECT Department           or Dr. Prescott Gum office [phone]  6)  Your next ECT Treatment is Day Monday  Date December 22, 2019  We will call 2 days prior to your scheduled appointment for arrival times.  7)  Nothing to eat or drink after midnight the night before your procedure.  8)  Take .     With a sip of water the morning of your procedure.  9)  Other Instructions: Call [phone] to cancel the morning of your procedure due         to illness or emergency.  10) We will call within 72 hours to assess how you are feeling.

## 2019-12-08 NOTE — Anesthesia Postprocedure Evaluation (Signed)
Anesthesia Post Note  Patient: Amy [name]  Procedure(s) Performed: ECT TX  Patient location during evaluation: PACU Anesthesia Type: General Level of consciousness: awake and alert Pain management: pain level controlled Vital Signs Assessment: post-procedure vital signs reviewed and stable Respiratory status: spontaneous breathing, nonlabored ventilation and respiratory function stable Cardiovascular status: blood pressure returned to baseline and stable Postop Assessment: no signs of nausea or vomiting Anesthetic complications: no     Last Vitals:  Vitals:   12/08/19 1205 12/08/19 1225  BP: 123/79 131/71  Pulse: 82 75  Resp: 14 16  Temp:    SpO2: 99%     Last Pain:  Vitals:   12/08/19 1225  TempSrc:   PainSc: 0-No pain                 Michaella Imai

## 2019-12-08 NOTE — Transfer of Care (Signed)
Immediate Anesthesia Transfer of Care Note  Patient: Amy [name]  Procedure(s) Performed: ECT TX  Patient Location: PACU  Anesthesia Type:General  Level of Consciousness: sedated  Airway & Oxygen Therapy: Patient Spontanous Breathing and Patient connected to face mask oxygen  Post-op Assessment: Report given to RN and Post -op Vital signs reviewed and stable  Post vital signs: Reviewed and stable  Last Vitals:  Vitals Value Taken Time  BP 115/73 12/08/19 1136  Temp 36.2 C 12/08/19 1136  Pulse 80 12/08/19 1136  Resp 14 12/08/19 1136  SpO2 100 % 12/08/19 1136    Last Pain:  Vitals:   12/08/19 1136  TempSrc: Temporal  PainSc:          Complications: No apparent anesthesia complications

## 2019-12-08 NOTE — Anesthesia Procedure Notes (Signed)
Procedure Name: General with mask airway Date/Time: 12/08/2019 11:15 AM Performed by: Johnna Acosta, CRNA Pre-anesthesia Checklist: Patient identified, Emergency Drugs available, Suction available, Patient being monitored and Timeout performed Patient Re-evaluated:Patient Re-evaluated prior to induction Oxygen Delivery Method: Circle system utilized Preoxygenation: Pre-oxygenation with 100% oxygen Induction Type: IV induction Ventilation: Mask ventilation without difficulty

## 2019-12-08 NOTE — Anesthesia Preprocedure Evaluation (Addendum)
Anesthesia Evaluation  Patient identified by MRN, date of birth, ID band Patient awake    Reviewed: Allergy & Precautions, NPO status , Patient's Chart, lab work & pertinent test results  History of Anesthesia Complications Negative for: history of anesthetic complications  Airway Mallampati: II  TM Distance: >3 FB Neck ROM: Full    Dental no notable dental hx.    Pulmonary sleep apnea , neg COPD,    breath sounds clear to auscultation- rhonchi (-) wheezing      Cardiovascular hypertension, Pt. on medications (-) CAD, (-) Past MI, (-) Cardiac Stents and (-) CABG  Rhythm:Regular Rate:Normal - Systolic murmurs and - Diastolic murmurs    Neuro/Psych PSYCHIATRIC DISORDERS Anxiety Depression Bipolar Disorder TIA   GI/Hepatic Neg liver ROS, GERD  ,  Endo/Other  diabetes, Insulin Dependent, Oral Hypoglycemic Agents  Renal/GU negative Renal ROS     Musculoskeletal  (+) Arthritis ,   Abdominal (+) - obese,   Peds  Hematology  (+) anemia ,   Anesthesia Other Findings Past Medical History: No date: ADHD (attention deficit hyperactivity disorder) No date: Anemia No date: Anxiety No date: Bipolar disorder ([place]) 09/2014: Carpal tunnel syndrome of right wrist No date: Cataract, immature No date: Depression No date: Diabetic neuropathy (HCC)     Comment:  feet No date: Dyslipidemia No date: GERD (gastroesophageal reflux disease) No date: Hypertension     Comment:  states under control with med., has been on med. x 20               yrs. No date: Insulin dependent diabetes mellitus No date: Major depressive disorder No date: Osteoarthritis     Comment:  knees No date: SLEEP APNEA     Comment:  uses CPAP nightly No date: Vitamin D deficiency   Reproductive/Obstetrics                             Anesthesia Physical  Anesthesia Plan  ASA: III  Anesthesia Plan: General   Post-op Pain  Management:    Induction: Intravenous  PONV Risk Score and Plan: 2 and Ondansetron  Airway Management Planned: Mask  Additional Equipment:   Intra-op Plan:   Post-operative Plan:   Informed Consent: I have reviewed the patients History and Physical, chart, labs and discussed the procedure including the risks, benefits and alternatives for the proposed anesthesia with the patient or authorized representative who has indicated his/her understanding and acceptance.     Dental advisory given  Plan Discussed with: CRNA and Anesthesiologist  Anesthesia Plan Comments:         Anesthesia Quick Evaluation

## 2019-12-09 ENCOUNTER — Ambulatory Visit: Payer: Federal, State, Local not specified - PPO | Attending: Internal Medicine

## 2019-12-09 DIAGNOSIS — Z23 Encounter for immunization: Secondary | ICD-10-CM

## 2019-12-09 NOTE — Progress Notes (Signed)
   Covid-19 Vaccination Clinic  Name:  KAYLAN FAHMY    MRN: [id number] DOB: 08-18-1956  12/09/2019  Ms. Winker was observed post Covid-19 immunization for 15 minutes without incident. She was provided with Vaccine Information Sheet and instruction to access the V-Safe system.   Ms. Lawrenz was instructed to call 911 with any severe reactions post vaccine: Marland Kitchen Difficulty breathing  . Swelling of face and throat  . A fast heartbeat  . A bad rash all over body  . Dizziness and weakness   Immunizations Administered    Name Date Dose VIS Date Route   Pfizer COVID-19 Vaccine 12/09/2019 10:11 AM 0.3 mL 08/08/2019 Intramuscular   Manufacturer: [place]   Lot: [id number]   [place]: [id number]

## 2019-12-10 DIAGNOSIS — F332 Major depressive disorder, recurrent severe without psychotic features: Secondary | ICD-10-CM | POA: Diagnosis not present

## 2019-12-15 ENCOUNTER — Telehealth: Payer: Self-pay

## 2019-12-15 NOTE — H&P (Signed)
Amy [name] is an 64 y.o. female.   Chief Complaint: Patient was seen for ECT on April 12.  She continues to complain of depression that has been worse recently.  No suicidal thoughts no psychosis but rundown feeling negative and tired HPI: History of recurrent depression  Past Medical History:  Diagnosis Date  . ADHD (attention deficit hyperactivity disorder)   . Anemia   . Anxiety   . Bipolar disorder ([place])   . Carpal tunnel syndrome of right wrist 09/2014  . Cataract, immature   . Depression   . Diabetic neuropathy (HCC)    feet  . Dyslipidemia   . GERD (gastroesophageal reflux disease)   . Hypertension    states under control with med., has been on med. x 20 yrs.  . Insulin dependent diabetes mellitus   . Major depressive disorder   . Osteoarthritis    knees  . SLEEP APNEA    uses CPAP nightly  . Vitamin D deficiency     Past Surgical History:  Procedure Laterality Date  . BREAST REDUCTION SURGERY Bilateral   . CARPAL TUNNEL RELEASE Right 10/08/2014   Procedure: RIGHT CARPAL TUNNEL RELEASE;  Surgeon: Daryll Brod, MD;  Location: [place];  Service: Orthopedics;  Laterality: Right;  . COLONOSCOPY WITH PROPOFOL  10/27/2013  . ESOPHAGOGASTRODUODENOSCOPY  02/03/2004  . LAPAROSCOPIC GASTRIC BANDING  12/05/2005    Family History  Problem Relation Age of Onset  . Heart disease Father   . Heart attack Father   . Breast cancer Sister   . Diabetes Sister   . Sarcoidosis Brother   . Cancer Brother        prostate  . Heart failure Mother   . Colon cancer Neg Hx   . Dementia Neg Hx    Social History:  reports that she has never smoked. She has never used smokeless tobacco. She reports that she does not drink alcohol or use drugs.  Allergies: No Known Allergies  (Not in a hospital admission)   No results found for this or any previous visit (from the past 48 hour(s)). No results found.  Review of Systems  Constitutional: Negative.   HENT: Negative.    Eyes: Negative.   Respiratory: Negative.   Cardiovascular: Negative.   Gastrointestinal: Negative.   Musculoskeletal: Negative.   Skin: Negative.   Neurological: Negative.   Psychiatric/Behavioral: Positive for dysphoric mood.    Blood pressure 131/71, pulse 75, temperature (!) 97.1 F (36.2 C), temperature source Temporal, resp. rate 16, height 5\' 5"  (1.651 m), weight 74.8 kg, SpO2 99 %. Physical Exam  Nursing note and vitals reviewed. Constitutional: She appears well-developed and well-nourished.  HENT:  Head: Normocephalic and atraumatic.  Eyes: Pupils are equal, round, and reactive to light. Conjunctivae are normal.  Cardiovascular: Normal heart sounds.  Respiratory: Effort normal.  GI: Soft.  Musculoskeletal:        General: Normal range of motion.     Cervical back: Normal range of motion.  Neurological: She is alert.  Skin: Skin is warm and dry.  Psychiatric: Judgment normal. Her affect is blunt. Her speech is delayed. She is slowed. Cognition and memory are normal. She exhibits a depressed mood. She expresses no homicidal and no suicidal ideation.     Assessment/Plan Patient will receive ECT today and we also plan to schedule her to come back a little sooner in a couple weeks this time.  [name], MD 12/08/2019, 10:00 am

## 2019-12-15 NOTE — Procedures (Signed)
ECT SERVICES Physician's Interval Evaluation & Treatment Note  Patient Identification: Amy [name] MRN:  [id number] Date of Evaluation:  12/15/2019 TX #: 10  MADRS:   MMSE:   P.E. Findings:  No change to physical exam  Psychiatric Interval Note:  Significantly more down and depressed but not psychotic and not with any suicidal thought  Subjective:  Patient is a 64 y.o. female seen for evaluation for Electroconvulsive Therapy. More down negative and sluggish.  Treatment Summary:   [x]   Right Unilateral             []  Bilateral   % Energy : 0.3 ms 60%   Impedance: 1620 ohms  Seizure Energy Index:    Postictal Suppression Index:   Seizure Concordance Index: 88%  Medications  Pre Shock: Robinul 0.2 mg Toradol 30 mg Brevital 70 mg succinylcholine 80 mg  Post Shock: None  Seizure Duration: 75 seconds EMG 157 seconds EEG   Comments: We would like to see her back in 2 weeks.  Lungs:  [x]   Clear to auscultation               []  Other:   Heart:    [x]   Regular rhythm             []  irregular rhythm    [x]   Previous H&P reviewed, patient examined and there are NO CHANGES                 []   Previous H&P reviewed, patient examined and there are changes noted.   [name], MD 4/12/20210:00am

## 2019-12-17 DIAGNOSIS — F332 Major depressive disorder, recurrent severe without psychotic features: Secondary | ICD-10-CM | POA: Diagnosis not present

## 2019-12-22 ENCOUNTER — Ambulatory Visit: Payer: Federal, State, Local not specified - PPO | Admitting: Gastroenterology

## 2019-12-22 VITALS — BP 130/68 | HR 68 | Temp 97.9°F | Ht 64.0 in | Wt 170.0 lb

## 2019-12-22 DIAGNOSIS — R159 Full incontinence of feces: Secondary | ICD-10-CM

## 2019-12-22 DIAGNOSIS — K529 Noninfective gastroenteritis and colitis, unspecified: Secondary | ICD-10-CM

## 2019-12-22 NOTE — Progress Notes (Signed)
[facility] Consult Note:  History: Amy [name] 12/22/2019  Referring provider: Harlan Stains, MD  Reason for consult/chief complaint: Encopresis (patient states having accident, feel like she is having gas) and Gas   Subjective  HPI:  This is a very pleasant 64 year old woman referred by primary care for change in bowel habits.  For the last several months she has had more frequent bowel movements, often occurring soon after meals with some urgency.  More bothersome has been that late evening while laying in bed watching television or sometimes if she wakes from sleep at night, she will feel the need to pass gas but instead will have incontinence of stool.  She cannot tell for sure if it is gas liquid or solid stool to be passed.  Most of the time now her stools are only semiformed.  She does not recall any new medicines or other clear triggers for this change in bowel habits, and has been on a stable diabetic treatment regimen for a few years to the best of her recollection.  A new medicine was started sometime recently for tremor.  Normal screening colonoscopy [name]) 10/2013 ROS:  Review of Systems  Constitutional: Negative for appetite change and unexpected weight change.  HENT: Negative for mouth sores and voice change.   Eyes: Negative for pain and redness.  Respiratory: Negative for cough and shortness of breath.   Cardiovascular: Negative for chest pain and palpitations.  Genitourinary: Negative for dysuria and hematuria.  Musculoskeletal: Negative for arthralgias and myalgias.  Skin: Negative for pallor and rash.  Neurological: Negative for weakness and headaches.       Right hand tremor  Diabetic neuropathy in the feet  Hematological: Negative for adenopathy.  Psychiatric/Behavioral:       Depression requiring ECT     Past Medical History: Past Medical History:  Diagnosis Date  . ADHD (attention deficit hyperactivity disorder)   . Anemia    . Anxiety   . Bipolar disorder ([facility])   . Carpal tunnel syndrome of right wrist 09/2014  . Cataract, immature   . Depression   . Diabetic neuropathy (HCC)    feet  . Dyslipidemia   . GERD (gastroesophageal reflux disease)   . Gout   . Hammer toe   . Hyperlipidemia   . Hypertension    states under control with med., has been on med. x 20 yrs.  . IDA (iron deficiency anemia)   . Insulin dependent diabetes mellitus   . Major depressive disorder   . Osteoarthritis    knees  . Seborrheic dermatitis of scalp   . SLEEP APNEA    uses CPAP nightly  . Vitamin D deficiency      Past Surgical History: Past Surgical History:  Procedure Laterality Date  . BREAST REDUCTION SURGERY Bilateral   . CARPAL TUNNEL RELEASE Right 10/08/2014   Procedure: RIGHT CARPAL TUNNEL RELEASE;  Surgeon: Daryll Brod, MD;  Location: [place];  Service: Orthopedics;  Laterality: Right;  . COLONOSCOPY WITH PROPOFOL  10/27/2013  . ESOPHAGOGASTRODUODENOSCOPY  02/03/2004  . LAPAROSCOPIC GASTRIC BANDING  12/05/2005     Family History: Family History  Problem Relation Age of Onset  . Heart disease Father   . Heart attack Father   . Breast cancer Sister   . Diabetes Sister   . Sarcoidosis Brother   . Prostate cancer Brother   . Heart failure Mother   . Colon cancer Neg Hx   . Dementia Neg Hx  Social History: Social History   Socioeconomic History  . Marital status: Married    Spouse name: Jeneen Rinks  . Number of children: 2  . Years of education: 92  . Highest education level: High school graduate  Occupational History  . Occupation: maintenance    Employer: [employer]  Tobacco Use  . Smoking status: Never Smoker  . Smokeless tobacco: Never Used  Substance and Sexual Activity  . Alcohol use: No  . Drug use: No  . Sexual activity: Not Currently  Other Topics Concern  . Not on file  Social History Narrative   Married with one son and one daughter   Caffeine: 2 cups a day of  tea   Lives at home with her husband   Social Determinants of Health   Financial Resource Strain: Low Risk   . Difficulty of Paying Living Expenses: Not hard at all  Food Insecurity: No Food Insecurity  . Worried About Charity fundraiser in the Last Year: Never true  . Ran Out of Food in the Last Year: Never true  Transportation Needs: No Transportation Needs  . Lack of Transportation (Medical): No  . Lack of Transportation (Non-Medical): No  Physical Activity: Insufficiently Active  . Days of Exercise per Week: 3 days  . Minutes of Exercise per Session: 30 min  Stress: Stress Concern Present  . Feeling of Stress : Very much  Social Connections: Unknown  . Frequency of Communication with Friends and Family: Not on file  . Frequency of Social Gatherings with Friends and Family: Not on file  . Attends Religious Services: 1 to 4 times per year  . Active Member of Clubs or Organizations: Yes  . Attends Archivist Meetings: 1 to 4 times per year  . Marital Status: Married    Allergies: No Known Allergies  Outpatient Meds: Current Outpatient Medications  Medication Sig Dispense Refill  . Acetaminophen (TYLENOL PO) Take 500 mg by mouth as needed.    Marland Kitchen aspirin 81 MG tablet Take 81 mg by mouth daily.     Marland Kitchen atenolol (TENORMIN) 25 MG tablet atenolol 25 mg tablet    . cetirizine (ZYRTEC) 10 MG tablet Take 10 mg by mouth daily.     . Continuous Blood Gluc Sensor (FREESTYLE LIBRE SENSOR SYSTEM) MISC USE 1 SENSOR EVERY 10 DAYS  5  . Dulaglutide (TRULICITY) 1.5 ID/7.8EU SOPN Trulicity 1.5 MP/5.3 mL subcutaneous pen injector    . ergocalciferol (VITAMIN D2) 50000 UNITS capsule Take 50,000 Units by mouth every Monday.     Marland Kitchen FERREX 150 150 MG capsule Take by mouth daily.  1  . FLUOCINOLONE ACETONIDE SCALP 0.01 % OIL Apply 1 application topically 2 (two) times a week.     . furosemide (LASIX) 40 MG tablet Take 1 tablet by mouth daily.    Marland Kitchen gabapentin (NEURONTIN) 600 MG tablet 2  tablets twice daily (Patient taking differently: Take 600 mg by mouth 2 (two) times daily. 2 tablets twice daily) 360 tablet 0  . hydroquinone 4 % cream APPLY TO AFFECTED AREA ON FACE EVERY NIGHT    . INGREZZA 40 MG CAPS Take 40 mg by mouth at bedtime.     . Insulin Glargine (LANTUS SOLOSTAR) 100 UNIT/ML Solostar Pen Inject 26 Units into the skin at bedtime. (Patient taking differently: Inject 36 Units into the skin at bedtime. ) 15 pen 1  . insulin lispro (HUMALOG) 100 UNIT/ML KiwkPen Inject 3-6 Units into the skin 3 (three) times daily.  According to blood sugar.    . Insulin Pen Needle (FIFTY50 PEN NEEDLES) 31G X 5 MM MISC BD Ultra-Fine Mini Pen Needle 31 gauge x 3/16"    . KLOR-CON M20 20 MEQ tablet Take 40 mEq by mouth daily.     Marland Kitchen L-Methylfolate-Algae (L-METHYLFOLATE FORTE) 15-90.314 MG CAPS Take 1 tablet by mouth at bedtime.    . metFORMIN (GLUCOPHAGE-XR) 500 MG 24 hr tablet Take 1,000 mg by mouth 2 (two) times daily.    Marland Kitchen NOVOLOG FLEXPEN 100 UNIT/ML FlexPen INJECT 3 TO 12 UNITS SUBCUTANEOUSLY 3 TIMES A DAY    . phenelzine (NARDIL) 15 MG tablet phenelzine 15 mg tablet    . telmisartan (MICARDIS) 80 MG tablet Take 80 mg by mouth daily.     . traZODone (DESYREL) 50 MG tablet 4 tablets at night    . triamcinolone (NASACORT) 55 MCG/ACT AERO nasal inhaler Place 2 sprays into the nose daily.    Marland Kitchen triamcinolone cream (KENALOG) 0.1 % triamcinolone acetonide 0.1 % topical cream    . TRINTELLIX 20 MG TABS tablet Take 20 mg by mouth daily.  3  . zaleplon (SONATA) 10 MG capsule Take 20 mg by mouth at bedtime.     No current facility-administered medications for this visit.   Facility-Administered Medications Ordered in Other Visits  Medication Dose Route Frequency Provider Last Rate Last Admin  . gadopentetate dimeglumine (MAGNEVIST) injection 14 mL  14 mL Intravenous Once PRN Melvenia Beam, MD       She does not know how long she has been on the iron tablet or why she is taking  it.   ___________________________________________________________________ Objective   Exam:  BP 130/68   Pulse 68   Temp 97.9 F (36.6 C)   Ht _0  (1.626 m)   Wt 170 lb (77.1 kg)   SpO2 99%   BMI 29.18 kg/m    General: Blunted affect.  Pleasant and conversational  Eyes: sclera anicteric, no redness  ENT: oral mucosa moist without lesions, no cervical or supraclavicular lymphadenopathy  CV: RRR without murmur, S1/S2, no JVD, no peripheral edema  Resp: clear to auscultation bilaterally, normal RR and effort noted  GI: soft, no tenderness, with active bowel sounds. No guarding or palpable organomegaly noted.  Skin; warm and dry, no rash or jaundice noted  Neuro: awake, alert and oriented x 3. Normal gross motor function and fluent speech  Labs:  Primary care labs dated 11/17/2019:  ESR 3 CBC normal  Radiologic Studies:  None  Assessment: Encounter Diagnoses  Name Primary?  . Chronic diarrhea Yes  . Full incontinence of feces     Change in bowel habits for several months, more frequent and semiformed to loose stool with urgency.  Most bothersome on the late evening and overnight symptoms causing incontinence. There do not appear to be any recent medication additions or changes that may have accounted for it.  Consider microscopic colitis.  She was taking NSAIDs frequently for dental pain until a few months ago, and is on aspirin regularly.  Plan:  Colonoscopy. She is agreeable after discussion of procedure and risks.  The benefits and risks of the planned procedure were described in detail with the patient or (when appropriate) their health care proxy.  Risks were outlined as including, but not limited to, bleeding, infection, perforation, adverse medication reaction leading to cardiac or pulmonary decompensation, pancreatitis (if ERCP).  The limitation of incomplete mucosal visualization was also discussed.  No guarantees or warranties were given.  2  Imodium tablets each evening to help reduce overnight symptoms.  Thank you for the courtesy of this consult.  Please call me with any questions or concerns.  Nelida Meuse III  CC: Referring provider noted above

## 2019-12-22 NOTE — Patient Instructions (Addendum)
If you are age 62 or older, your body mass index should be between 23-30. Your Body mass index is 29.18 kg/m. If this is out of the aforementioned range listed, please consider follow up with your Primary Care Provider.  If you are age 83 or younger, your body mass index should be between 19-25. Your Body mass index is 29.18 kg/m. If this is out of the aformentioned range listed, please consider follow up with your Primary Care Provider.   You have been scheduled for a colonoscopy. Please follow written instructions given to you at your visit today.  Please pick up your prep supplies at the pharmacy within the next 1-3 days. If you use inhalers (even only as needed), please bring them with you on the day of your procedure.  Please take 2 Imodium tablets at bedtime. DO NOT take on the colonoscopy prep days!  It was a pleasure to see you today!  Dr. Loletha Carrow

## 2019-12-24 ENCOUNTER — Encounter: Payer: Self-pay | Admitting: Gastroenterology

## 2019-12-24 DIAGNOSIS — R768 Other specified abnormal immunological findings in serum: Secondary | ICD-10-CM | POA: Diagnosis not present

## 2019-12-24 DIAGNOSIS — M1990 Unspecified osteoarthritis, unspecified site: Secondary | ICD-10-CM | POA: Diagnosis not present

## 2019-12-24 DIAGNOSIS — F332 Major depressive disorder, recurrent severe without psychotic features: Secondary | ICD-10-CM | POA: Diagnosis not present

## 2019-12-24 DIAGNOSIS — M064 Inflammatory polyarthropathy: Secondary | ICD-10-CM | POA: Diagnosis not present

## 2019-12-24 DIAGNOSIS — M359 Systemic involvement of connective tissue, unspecified: Secondary | ICD-10-CM | POA: Diagnosis not present

## 2019-12-26 ENCOUNTER — Other Ambulatory Visit: Payer: Self-pay

## 2019-12-26 ENCOUNTER — Encounter: Payer: Self-pay | Admitting: Gastroenterology

## 2019-12-26 ENCOUNTER — Ambulatory Visit (AMBULATORY_SURGERY_CENTER): Payer: Federal, State, Local not specified - PPO | Admitting: Gastroenterology

## 2019-12-26 VITALS — BP 112/72 | HR 77 | Temp 96.8°F | Resp 14 | Ht 64.0 in | Wt 170.0 lb

## 2019-12-26 DIAGNOSIS — K599 Functional intestinal disorder, unspecified: Secondary | ICD-10-CM | POA: Diagnosis not present

## 2019-12-26 DIAGNOSIS — R159 Full incontinence of feces: Secondary | ICD-10-CM

## 2019-12-26 DIAGNOSIS — K529 Noninfective gastroenteritis and colitis, unspecified: Secondary | ICD-10-CM | POA: Diagnosis not present

## 2019-12-26 DIAGNOSIS — R197 Diarrhea, unspecified: Secondary | ICD-10-CM | POA: Diagnosis not present

## 2019-12-26 MED ORDER — SODIUM CHLORIDE 0.9 % IV SOLN: 500.0000 mL | Freq: Once | INTRAVENOUS | Status: DC

## 2019-12-26 NOTE — Progress Notes (Signed)
Report to PACU, RN, vss, BBS= Clear.  

## 2019-12-26 NOTE — Op Note (Signed)
[place] Patient Name: Amy [name] Procedure Date: 12/26/2019 10:53 AM MRN: [id number] Endoscopist: Mallie Mussel L. Loletha Carrow , MD Age: 64 Referring MD:  Date of Birth: 1955/11/05 Gender: Female Account #: [id number] Procedure:                Colonoscopy Indications:              Chronic diarrhea, Fecal incontinence (several                            months) Medicines:                Monitored Anesthesia Care Procedure:                Pre-Anesthesia Assessment:                           - Prior to the procedure, a History and Physical                            was performed, and patient medications and                            allergies were reviewed. The patient's tolerance of                            previous anesthesia was also reviewed. The risks                            and benefits of the procedure and the sedation                            options and risks were discussed with the patient.                            All questions were answered, and informed consent                            was obtained. Prior Anticoagulants: The patient has                            taken no previous anticoagulant or antiplatelet                            agents. ASA Grade Assessment: II - A patient with                            mild systemic disease. After reviewing the risks                            and benefits, the patient was deemed in                            satisfactory condition to undergo the procedure.  After obtaining informed consent, the colonoscope                            was passed under direct vision. Throughout the                            procedure, the patient's blood pressure, pulse, and                            oxygen saturations were monitored continuously. The                            Colonoscope was introduced through the anus and                            advanced to the the cecum, identified by             appendiceal orifice and ileocecal valve. The                            colonoscopy was performed with difficulty due to a                            redundant colon and significant looping. Successful                            completion of the procedure was aided by reducing                            loop and using manual pressure. The patient                            tolerated the procedure well. The quality of the                            bowel preparation was good. The ileocecal valve,                            appendiceal orifice, and rectum were photographed                            (TI could not be intubated due to scope looping).                            The bowel preparation used was Miralax. Scope In: 11:00:49 AM Scope Out: 11:23:26 AM Scope Withdrawal Time: 0 hours 11 minutes 15 seconds  Total Procedure Duration: 0 hours 22 minutes 37 seconds  Findings:                 The digital rectal exam findings include decreased                            sphincter tone.  There is no endoscopic evidence of polyps in the                            entire colon.                           Normal mucosa was found in the entire colon.                            Biopsies for histology were taken with a cold                            forceps from the right colon and left colon for                            evaluation of microscopic colitis.                           The retroflexed view of the distal rectum and anal                            verge was normal and showed no anal or rectal                            abnormalities. Complications:            No immediate complications. Estimated Blood Loss:     Estimated blood loss was minimal. Impression:               - Decreased sphincter tone found on digital rectal                            exam.                           - Normal mucosa in the entire examined colon.                             Biopsied.                           - The distal rectum and anal verge are normal on                            retroflexion view. Recommendation:           - Patient has a contact number available for                            emergencies. The signs and symptoms of potential                            delayed complications were discussed with the                            patient. Return to normal activities tomorrow.  Written discharge instructions were provided to the                            patient.                           - Resume previous diet.                           - Continue present medications.                           - Await pathology results.                           - Repeat colonoscopy in 10 years for screening                            purposes.                           - Return to my office after studies are complete. Belenda Alviar L. Loletha Carrow, MD 12/26/2019 11:27:57 AM This report has been signed electronically.

## 2019-12-26 NOTE — Progress Notes (Signed)
Pt's states no medical or surgical changes since previsit or office visit.  SF IV, LC temp and CW vitals.

## 2019-12-26 NOTE — Patient Instructions (Signed)
Await pathology results   YOU HAD AN ENDOSCOPIC PROCEDURE TODAY AT THE [facility]:   Refer to the procedure report that was given to you for any specific questions about what was found during the examination.  If the procedure report does not answer your questions, please call your gastroenterologist to clarify.  If you requested that your care partner not be given the details of your procedure findings, then the procedure report has been included in a sealed envelope for you to review at your convenience later.  YOU SHOULD EXPECT: Some feelings of bloating in the abdomen. Passage of more gas than usual.  Walking can help get rid of the air that was put into your GI tract during the procedure and reduce the bloating. If you had a lower endoscopy (such as a colonoscopy or flexible sigmoidoscopy) you may notice spotting of blood in your stool or on the toilet paper. If you underwent a bowel prep for your procedure, you may not have a normal bowel movement for a few days.  Please Note:  You might notice some irritation and congestion in your nose or some drainage.  This is from the oxygen used during your procedure.  There is no need for concern and it should clear up in a day or so.  SYMPTOMS TO REPORT IMMEDIATELY:   Following lower endoscopy (colonoscopy or flexible sigmoidoscopy):  Excessive amounts of blood in the stool  Significant tenderness or worsening of abdominal pains  Swelling of the abdomen that is new, acute  Fever of 100F or higher  For urgent or emergent issues, a gastroenterologist can be reached at any hour by calling [phone]. Do not use MyChart messaging for urgent concerns.    DIET:  We do recommend a small meal at first, but then you may proceed to your regular diet.  Drink plenty of fluids but you should avoid alcoholic beverages for 24 hours.  ACTIVITY:  You should plan to take it easy for the rest of today and you should NOT DRIVE or use heavy  machinery until tomorrow (because of the sedation medicines used during the test).    FOLLOW UP: Our staff will call the number listed on your records 48-72 hours following your procedure to check on you and address any questions or concerns that you may have regarding the information given to you following your procedure. If we do not reach you, we will leave a message.  We will attempt to reach you two times.  During this call, we will ask if you have developed any symptoms of COVID 19. If you develop any symptoms (ie: fever, flu-like symptoms, shortness of breath, cough etc.) before then, please call [phone].  If you test positive for Covid 19 in the 2 weeks post procedure, please call and report this information to us.    If any biopsies were taken you will be contacted by phone or by letter within the next 1-3 weeks.  Please call us at [phone] if you have not heard about the biopsies in 3 weeks.    SIGNATURES/CONFIDENTIALITY: You and/or your care partner have signed paperwork which will be entered into your electronic medical record.  These signatures attest to the fact that that the information above on your After Visit Summary has been reviewed and is understood.  Full responsibility of the confidentiality of this discharge information lies with you and/or your care-partner. 

## 2019-12-26 NOTE — Progress Notes (Signed)
Called to room to assist during endoscopic procedure.  Patient ID and intended procedure confirmed with present staff. Received instructions for my participation in the procedure from the performing physician.  

## 2019-12-30 ENCOUNTER — Telehealth: Payer: Self-pay

## 2019-12-30 ENCOUNTER — Other Ambulatory Visit: Payer: Self-pay

## 2019-12-30 DIAGNOSIS — K529 Noninfective gastroenteritis and colitis, unspecified: Secondary | ICD-10-CM

## 2019-12-30 MED ORDER — METRONIDAZOLE 250 MG PO TABS: 250.0000 mg | ORAL_TABLET | Freq: Three times a day (TID) | ORAL | 0 refills | Status: DC

## 2019-12-30 MED ORDER — CEPHALEXIN 250 MG PO CAPS: 250.0000 mg | ORAL_CAPSULE | Freq: Three times a day (TID) | ORAL | 0 refills | Status: DC

## 2019-12-30 NOTE — Telephone Encounter (Signed)
  Follow up Call-  Call back number 12/26/2019  Post procedure Call Back phone  # [phone]  Permission to leave phone message Yes  Some recent data might be hidden     Patient questions:  Do you have a fever, pain , or abdominal swelling? No. Pain Score  0 *  Have you tolerated food without any problems? Yes.    Have you been able to return to your normal activities? Yes.    Do you have any questions about your discharge instructions: Diet   No. Medications  No. Follow up visit  No.  Do you have questions or concerns about your Care? No.  Actions: * If pain score is 4 or above: No action needed, pain <4.  1. Have you developed a fever since your procedure? no  2.   Have you had an respiratory symptoms (SOB or cough) since your procedure? no  3.   Have you tested positive for COVID 19 since your procedure no  4.   Have you had any family members/close contacts diagnosed with the COVID 19 since your procedure?  no   If yes to any of these questions please route to Joylene John, RN and Erenest Rasher, RN

## 2019-12-31 DIAGNOSIS — F332 Major depressive disorder, recurrent severe without psychotic features: Secondary | ICD-10-CM | POA: Diagnosis not present

## 2020-01-01 DIAGNOSIS — L280 Lichen simplex chronicus: Secondary | ICD-10-CM | POA: Diagnosis not present

## 2020-01-01 DIAGNOSIS — L668 Other cicatricial alopecia: Secondary | ICD-10-CM | POA: Diagnosis not present

## 2020-01-02 ENCOUNTER — Telehealth: Payer: Self-pay

## 2020-01-02 ENCOUNTER — Other Ambulatory Visit: Payer: Self-pay | Admitting: Psychiatry

## 2020-01-06 DIAGNOSIS — Z1151 Encounter for screening for human papillomavirus (HPV): Secondary | ICD-10-CM | POA: Diagnosis not present

## 2020-01-06 DIAGNOSIS — Z124 Encounter for screening for malignant neoplasm of cervix: Secondary | ICD-10-CM | POA: Diagnosis not present

## 2020-01-06 DIAGNOSIS — Z01419 Encounter for gynecological examination (general) (routine) without abnormal findings: Secondary | ICD-10-CM | POA: Diagnosis not present

## 2020-01-06 DIAGNOSIS — Z6828 Body mass index (BMI) 28.0-28.9, adult: Secondary | ICD-10-CM | POA: Diagnosis not present

## 2020-01-07 DIAGNOSIS — F332 Major depressive disorder, recurrent severe without psychotic features: Secondary | ICD-10-CM | POA: Diagnosis not present

## 2020-01-08 DIAGNOSIS — F332 Major depressive disorder, recurrent severe without psychotic features: Secondary | ICD-10-CM | POA: Diagnosis not present

## 2020-01-12 ENCOUNTER — Inpatient Hospital Stay: Admission: RE | Admit: 2020-01-12 | Payer: Federal, State, Local not specified - PPO | Source: Ambulatory Visit

## 2020-01-12 ENCOUNTER — Encounter: Payer: Self-pay | Admitting: Anesthesiology

## 2020-01-14 DIAGNOSIS — F332 Major depressive disorder, recurrent severe without psychotic features: Secondary | ICD-10-CM | POA: Diagnosis not present

## 2020-01-15 DIAGNOSIS — E1165 Type 2 diabetes mellitus with hyperglycemia: Secondary | ICD-10-CM | POA: Diagnosis not present

## 2020-01-15 DIAGNOSIS — E7800 Pure hypercholesterolemia, unspecified: Secondary | ICD-10-CM | POA: Diagnosis not present

## 2020-01-15 DIAGNOSIS — I10 Essential (primary) hypertension: Secondary | ICD-10-CM | POA: Diagnosis not present

## 2020-01-19 DIAGNOSIS — E7800 Pure hypercholesterolemia, unspecified: Secondary | ICD-10-CM | POA: Diagnosis not present

## 2020-01-19 DIAGNOSIS — I10 Essential (primary) hypertension: Secondary | ICD-10-CM | POA: Diagnosis not present

## 2020-01-19 DIAGNOSIS — E1165 Type 2 diabetes mellitus with hyperglycemia: Secondary | ICD-10-CM | POA: Diagnosis not present

## 2020-01-21 DIAGNOSIS — F332 Major depressive disorder, recurrent severe without psychotic features: Secondary | ICD-10-CM | POA: Diagnosis not present

## 2020-01-28 DIAGNOSIS — F332 Major depressive disorder, recurrent severe without psychotic features: Secondary | ICD-10-CM | POA: Diagnosis not present

## 2020-01-28 DIAGNOSIS — Z1231 Encounter for screening mammogram for malignant neoplasm of breast: Secondary | ICD-10-CM | POA: Diagnosis not present

## 2020-01-30 ENCOUNTER — Telehealth: Payer: Self-pay

## 2020-02-03 ENCOUNTER — Ambulatory Visit: Payer: Federal, State, Local not specified - PPO | Admitting: Gastroenterology

## 2020-02-03 ENCOUNTER — Encounter: Payer: Self-pay | Admitting: Gastroenterology

## 2020-02-03 VITALS — BP 102/73 | HR 78 | Ht 64.0 in | Wt 162.5 lb

## 2020-02-03 DIAGNOSIS — R159 Full incontinence of feces: Secondary | ICD-10-CM

## 2020-02-03 DIAGNOSIS — K529 Noninfective gastroenteritis and colitis, unspecified: Secondary | ICD-10-CM

## 2020-02-03 NOTE — Patient Instructions (Signed)
If you are age 62 or older, your body mass index should be between 23-30. Your Body mass index is 27.89 kg/m. If this is out of the aforementioned range listed, please consider follow up with your Primary Care Provider.  If you are age 71 or younger, your body mass index should be between 19-25. Your Body mass index is 27.89 kg/m. If this is out of the aformentioned range listed, please consider follow up with your Primary Care Provider.   Follow up as needed.  It was a pleasure to see you today!  Dr. Loletha Carrow

## 2020-02-03 NOTE — Progress Notes (Signed)
[facility] Progress Note  Chief Complaint: Chronic diarrhea  Subjective  History: Seen 4/26 for change bowels, fecal incontinence Colon 12/26/19.  Decreased ST, o/w nml colon, Bx taken to rule out micro colitis "Colon biopsies are normal, so no microscopic colitis was seen to explain the diarrhea. I would like to do some additional testing and try some treatment.   Labs: TTG antibody (IgA), total IgA level Stool study: Fecal elastase   Medication:   Metronidazole 250 mg tablet, 1 tablet 3 times daily x10 days Dispense 30 tablets, 0 refill   Cephalexin 250 mg tablet, 1 tablet 3 times daily x10 days Dispense 30 tablets, 0 refill   Please arrange a clinic visit with me in 4 to 6 weeks"  ______________________________  Blood/stool studies not done  Amy [name] is a somewhat limited historian with a restricted affect as before.  She tells me that the diarrhea seemed to stop a week or 2 ago, which seems to mean she has no longer having episodes of urgency and incontinence.  Her stool tends to be semiformed once or twice a day.  She did complete all the antibiotics as prescribed, but also tells me that her primary care provider may have stopped a sleeping medication she was taking.  And he seems to feel this is probably why the bowel habits improved.  ROS: Cardiovascular:  no chest pain Respiratory: no dyspnea Her appetite is reportedly good and weight stable.  The patient's Past Medical, Family and Social History were reviewed and are on file in the EMR.  Objective:  Med list reviewed  Current Outpatient Medications:  .  Acetaminophen (TYLENOL PO), Take 500 mg by mouth as needed., Disp: , Rfl:  .  aspirin 81 MG tablet, Take 81 mg by mouth daily. , Disp: , Rfl:  .  cetirizine (ZYRTEC) 10 MG tablet, Take 10 mg by mouth daily. , Disp: , Rfl:  .  Continuous Blood Gluc Sensor (FREESTYLE LIBRE SENSOR SYSTEM) MISC, USE 1 SENSOR EVERY 10 DAYS, Disp: , Rfl: 5 .  Dulaglutide  (TRULICITY) 1.5 GM/0.1UU SOPN, Trulicity 1.5 VO/5.3 mL subcutaneous pen injector, Disp: , Rfl:  .  ergocalciferol (VITAMIN D2) 50000 UNITS capsule, Take 50,000 Units by mouth every Monday. , Disp: , Rfl:  .  FERREX 150 150 MG capsule, Take by mouth daily., Disp: , Rfl: 1 .  FLUOCINOLONE ACETONIDE SCALP 0.01 % OIL, Apply 1 application topically 2 (two) times a week. , Disp: , Rfl:  .  furosemide (LASIX) 40 MG tablet, Take 1 tablet by mouth daily., Disp: , Rfl:  .  gabapentin (NEURONTIN) 600 MG tablet, 2 tablets twice daily (Patient taking differently: Take 600 mg by mouth 2 (two) times daily. 2 tablets twice daily), Disp: 360 tablet, Rfl: 0 .  hydroquinone 4 % cream, APPLY TO AFFECTED AREA ON FACE EVERY NIGHT, Disp: , Rfl:  .  INGREZZA 40 MG CAPS, Take 40 mg by mouth at bedtime. , Disp: , Rfl:  .  Insulin Glargine (LANTUS SOLOSTAR) 100 UNIT/ML Solostar Pen, Inject 26 Units into the skin at bedtime. (Patient taking differently: Inject 36 Units into the skin at bedtime. ), Disp: 15 pen, Rfl: 1 .  insulin lispro (HUMALOG) 100 UNIT/ML KiwkPen, Inject 3-6 Units into the skin 3 (three) times daily. According to blood sugar., Disp: , Rfl:  .  Insulin Pen Needle (FIFTY50 PEN NEEDLES) 31G X 5 MM MISC, BD Ultra-Fine Mini Pen Needle 31 gauge x 3/16", Disp: , Rfl:  .  KLOR-CON M20 20 MEQ tablet, Take 40 mEq by mouth daily. , Disp: , Rfl:  .  L-Methylfolate-Algae (L-METHYLFOLATE FORTE) 15-90.314 MG CAPS, Take 1 tablet by mouth at bedtime., Disp: , Rfl:  .  metFORMIN (GLUCOPHAGE-XR) 500 MG 24 hr tablet, Take 1,000 mg by mouth 2 (two) times daily., Disp: , Rfl:  .  NOVOLOG FLEXPEN 100 UNIT/ML FlexPen, INJECT 3 TO 12 UNITS SUBCUTANEOUSLY 3 TIMES A DAY, Disp: , Rfl:  .  telmisartan (MICARDIS) 80 MG tablet, Take 80 mg by mouth daily. , Disp: , Rfl:  .  traZODone (DESYREL) 50 MG tablet, 4 tablets at night, Disp: , Rfl:  .  triamcinolone (NASACORT) 55 MCG/ACT AERO nasal inhaler, Place 2 sprays into the nose daily.,  Disp: , Rfl:  .  triamcinolone cream (KENALOG) 0.1 %, triamcinolone acetonide 0.1 % topical cream, Disp: , Rfl:  .  TRINTELLIX 20 MG TABS tablet, Take 20 mg by mouth daily., Disp: , Rfl: 3 .  zaleplon (SONATA) 10 MG capsule, Take 20 mg by mouth at bedtime., Disp: , Rfl:  No current facility-administered medications for this visit.  Facility-Administered Medications Ordered in Other Visits:  .  gadopentetate dimeglumine (MAGNEVIST) injection 14 mL, 14 mL, Intravenous, Once PRN, Melvenia Beam, MD   Vital signs in last 24 hrs: Vitals:   02/03/20 0850  BP: 102/73  Pulse: 78    Physical Exam  Well-appearing, restricted affect, gets on exam table without difficulty or assistance  HEENT: sclera anicteric, oral mucosa moist without lesions  Neck: supple, no thyromegaly, JVD or lymphadenopathy  Cardiac: RRR without murmurs, S1S2 heard, no peripheral edema  Pulm: clear to auscultation bilaterally, normal RR and effort noted  Abdomen: soft, no tenderness, with active bowel sounds. No guarding or palpable hepatosplenomegaly.  Skin; warm and dry, no jaundice or rash  Labs:  Colon biopsies as above ___________________________________________ Radiologic studies:   ____________________________________________ Other:   _____________________________________________ Assessment & Plan  Assessment: Encounter Diagnoses  Name Primary?  . Chronic diarrhea Yes  . Full incontinence of feces    Diarrhea with incontinence, now lately improved.  I do not know what medicine may have been changed for her.  She did get empiric therapy for possible SIBO, but if her regulation of the timing is correct, its not clear if that really gave the improvement in symptoms.  At any rate, she self reports feeling much better and thinks there is no longer a problem at this point.   Plan: She can certainly take as needed antidiarrheals and I will gladly see her as the need arises.   20 minutes were  spent on this encounter (including chart review, history/exam, counseling/coordination of care, and documentation)  Nelida Meuse III

## 2020-02-04 DIAGNOSIS — F3341 Major depressive disorder, recurrent, in partial remission: Secondary | ICD-10-CM | POA: Diagnosis not present

## 2020-02-04 DIAGNOSIS — F332 Major depressive disorder, recurrent severe without psychotic features: Secondary | ICD-10-CM | POA: Diagnosis not present

## 2020-02-10 ENCOUNTER — Other Ambulatory Visit: Payer: Self-pay | Admitting: Obstetrics and Gynecology

## 2020-02-10 DIAGNOSIS — R928 Other abnormal and inconclusive findings on diagnostic imaging of breast: Secondary | ICD-10-CM

## 2020-02-11 DIAGNOSIS — F332 Major depressive disorder, recurrent severe without psychotic features: Secondary | ICD-10-CM | POA: Diagnosis not present

## 2020-02-17 DIAGNOSIS — I10 Essential (primary) hypertension: Secondary | ICD-10-CM | POA: Diagnosis not present

## 2020-02-17 DIAGNOSIS — E785 Hyperlipidemia, unspecified: Secondary | ICD-10-CM | POA: Diagnosis not present

## 2020-02-17 DIAGNOSIS — Z23 Encounter for immunization: Secondary | ICD-10-CM | POA: Diagnosis not present

## 2020-02-17 DIAGNOSIS — E1140 Type 2 diabetes mellitus with diabetic neuropathy, unspecified: Secondary | ICD-10-CM | POA: Diagnosis not present

## 2020-02-18 DIAGNOSIS — F332 Major depressive disorder, recurrent severe without psychotic features: Secondary | ICD-10-CM | POA: Diagnosis not present

## 2020-02-25 ENCOUNTER — Ambulatory Visit: Payer: Federal, State, Local not specified - PPO

## 2020-02-25 ENCOUNTER — Other Ambulatory Visit: Payer: Self-pay

## 2020-02-25 ENCOUNTER — Ambulatory Visit
Admission: RE | Admit: 2020-02-25 | Discharge: 2020-02-25 | Disposition: A | Discharge status: Home / Self Care | Payer: Federal, State, Local not specified - PPO | Source: Ambulatory Visit | Attending: Obstetrics and Gynecology | Admitting: Obstetrics and Gynecology

## 2020-02-25 DIAGNOSIS — R928 Other abnormal and inconclusive findings on diagnostic imaging of breast: Secondary | ICD-10-CM

## 2020-03-03 DIAGNOSIS — F332 Major depressive disorder, recurrent severe without psychotic features: Secondary | ICD-10-CM | POA: Diagnosis not present

## 2020-03-10 DIAGNOSIS — F332 Major depressive disorder, recurrent severe without psychotic features: Secondary | ICD-10-CM | POA: Diagnosis not present

## 2020-03-17 DIAGNOSIS — F332 Major depressive disorder, recurrent severe without psychotic features: Secondary | ICD-10-CM | POA: Diagnosis not present

## 2020-03-24 DIAGNOSIS — F332 Major depressive disorder, recurrent severe without psychotic features: Secondary | ICD-10-CM | POA: Diagnosis not present

## 2020-03-31 DIAGNOSIS — F332 Major depressive disorder, recurrent severe without psychotic features: Secondary | ICD-10-CM | POA: Diagnosis not present

## 2020-04-07 DIAGNOSIS — F332 Major depressive disorder, recurrent severe without psychotic features: Secondary | ICD-10-CM | POA: Diagnosis not present

## 2020-04-14 DIAGNOSIS — F332 Major depressive disorder, recurrent severe without psychotic features: Secondary | ICD-10-CM | POA: Diagnosis not present

## 2020-04-23 DIAGNOSIS — F332 Major depressive disorder, recurrent severe without psychotic features: Secondary | ICD-10-CM | POA: Diagnosis not present

## 2020-04-28 DIAGNOSIS — F332 Major depressive disorder, recurrent severe without psychotic features: Secondary | ICD-10-CM | POA: Diagnosis not present

## 2020-05-05 DIAGNOSIS — F332 Major depressive disorder, recurrent severe without psychotic features: Secondary | ICD-10-CM | POA: Diagnosis not present

## 2020-05-12 DIAGNOSIS — F332 Major depressive disorder, recurrent severe without psychotic features: Secondary | ICD-10-CM | POA: Diagnosis not present

## 2020-05-18 DIAGNOSIS — F332 Major depressive disorder, recurrent severe without psychotic features: Secondary | ICD-10-CM | POA: Diagnosis not present

## 2020-05-25 DIAGNOSIS — I10 Essential (primary) hypertension: Secondary | ICD-10-CM | POA: Diagnosis not present

## 2020-05-25 DIAGNOSIS — F3175 Bipolar disorder, in partial remission, most recent episode depressed: Secondary | ICD-10-CM | POA: Diagnosis not present

## 2020-05-25 DIAGNOSIS — E785 Hyperlipidemia, unspecified: Secondary | ICD-10-CM | POA: Diagnosis not present

## 2020-05-25 DIAGNOSIS — R350 Frequency of micturition: Secondary | ICD-10-CM | POA: Diagnosis not present

## 2020-05-25 DIAGNOSIS — Z23 Encounter for immunization: Secondary | ICD-10-CM | POA: Diagnosis not present

## 2020-05-25 DIAGNOSIS — E1140 Type 2 diabetes mellitus with diabetic neuropathy, unspecified: Secondary | ICD-10-CM | POA: Diagnosis not present

## 2020-05-28 ENCOUNTER — Other Ambulatory Visit: Payer: Self-pay | Admitting: Family Medicine

## 2020-05-28 DIAGNOSIS — D509 Iron deficiency anemia, unspecified: Secondary | ICD-10-CM | POA: Diagnosis not present

## 2020-05-28 DIAGNOSIS — R350 Frequency of micturition: Secondary | ICD-10-CM

## 2020-05-28 DIAGNOSIS — L299 Pruritus, unspecified: Secondary | ICD-10-CM | POA: Diagnosis not present

## 2020-05-28 DIAGNOSIS — R1010 Upper abdominal pain, unspecified: Secondary | ICD-10-CM

## 2020-06-01 DIAGNOSIS — F332 Major depressive disorder, recurrent severe without psychotic features: Secondary | ICD-10-CM | POA: Diagnosis not present

## 2020-06-02 ENCOUNTER — Ambulatory Visit (INDEPENDENT_AMBULATORY_CARE_PROVIDER_SITE_OTHER): Payer: Federal, State, Local not specified - PPO | Admitting: Gastroenterology

## 2020-06-02 ENCOUNTER — Encounter: Payer: Self-pay | Admitting: Gastroenterology

## 2020-06-02 VITALS — BP 128/66 | HR 66 | Ht 64.0 in | Wt 169.4 lb

## 2020-06-02 DIAGNOSIS — D500 Iron deficiency anemia secondary to blood loss (chronic): Secondary | ICD-10-CM | POA: Diagnosis not present

## 2020-06-02 DIAGNOSIS — R1010 Upper abdominal pain, unspecified: Secondary | ICD-10-CM

## 2020-06-02 DIAGNOSIS — K921 Melena: Secondary | ICD-10-CM | POA: Diagnosis not present

## 2020-06-02 NOTE — Progress Notes (Signed)
[facility] Progress Note  Chief Complaint: Worsened iron deficiency anemia  Subjective  History: Amy [name] was initially seen April 26 for chronic diarrhea, somewhat difficult to get a clear and consistent history.  Colonoscopy done April 30, negative biopsies.  She then had empiric therapy for possible SIBO with my note indicating the following on follow-up visit.02/03/2020:  "Diarrhea with incontinence, now lately improved.  I do not know what medicine may have been changed for her.  She did get empiric therapy for possible SIBO, but if her regulation of the timing is correct, its not clear if that really gave the improvement in symptoms.  At any rate, she self reports feeling much better and thinks there is no longer a problem at this point."  ______________________________ She had a diagnosis of iron deficiency anemia and was on chronic iron therapy when I saw her, however the most recent primary care labs available to me at the April visit or a normal CBC from March. And he is now referred back by Dr. Carita Pian of [facility] several days ago for iron deficiency anemia. PCP note from several days ago indicates patient had reported intermittent black tarry stool, but none for the last week or 2.  She was feeling lightheaded at times.  PPI was recommended but patient has not yet started it (she didn't seem to get that message).. Iron studies were going to be added onto those labs but we do not have those results. ______________________  Any says her diarrhea has not recurred since I last saw her.  For the last couple of months she has had an intermittent bandlike upper abdominal pain or bloating that makes her feel as if she needs to have a BM or perhaps urinate.  She denies nausea vomiting dysphagia or weight loss. There was an episode of bright red blood per rectum about 2 months ago.  She also may have had some black tarry stool over the last couple of weeks, but says it is  difficult to tell since she takes iron daily.  She takes aspirin daily, lately has used Aleve regularly because of some dental pain.  ROS: Cardiovascular:  no chest pain Respiratory: no dyspnea Fatigue Mood stable Chronic stable neuropathy symptoms Remainder of systems negative except as above The patient's Past Medical, Family and Social History were reviewed and are on file in the EMR. Past Medical History:  Diagnosis Date  . ADHD (attention deficit hyperactivity disorder)   . Anemia   . Anxiety   . Bipolar disorder ([place])   . Carpal tunnel syndrome of right wrist 09/2014  . Cataract, immature   . Depression   . Diabetic neuropathy ([facility])    feet  . Dyslipidemia   . GERD (gastroesophageal reflux disease)   . Gout   . Hammer toe   . Hyperlipidemia   . Hypertension    states under control with med., has been on med. x 20 yrs.  . IDA (iron deficiency anemia)   . Insulin dependent diabetes mellitus   . Major depressive disorder   . Osteoarthritis    knees  . Seborrheic dermatitis of scalp   . SLEEP APNEA    uses CPAP nightly  . Vitamin D deficiency    Past Surgical History:  Procedure Laterality Date  . BREAST REDUCTION SURGERY Bilateral   . CARPAL TUNNEL RELEASE Right 10/08/2014   Procedure: RIGHT CARPAL TUNNEL RELEASE;  Surgeon: Daryll Brod, MD;  Location: [facility];  Service: Orthopedics;  Laterality: Right;  . COLONOSCOPY WITH PROPOFOL  10/27/2013  . ESOPHAGOGASTRODUODENOSCOPY  02/03/2004  . LAPAROSCOPIC GASTRIC BANDING  12/05/2005    Objective:  Med list reviewed  Current Outpatient Medications:  .  Acetaminophen (TYLENOL PO), Take 500 mg by mouth as needed., Disp: , Rfl:  .  aspirin 81 MG tablet, Take 81 mg by mouth daily. , Disp: , Rfl:  .  cetirizine (ZYRTEC) 10 MG tablet, Take 10 mg by mouth daily. , Disp: , Rfl:  .  Continuous Blood Gluc Sensor (FREESTYLE LIBRE SENSOR SYSTEM) MISC, USE 1 SENSOR EVERY 10 DAYS, Disp: , Rfl: 5 .  Dulaglutide  (TRULICITY) 1.5 TG/6.2IR SOPN, Trulicity 1.5 SW/5.4 mL subcutaneous pen injector, Disp: , Rfl:  .  ergocalciferol (VITAMIN D2) 50000 UNITS capsule, Take 50,000 Units by mouth every Monday. , Disp: , Rfl:  .  FERREX 150 150 MG capsule, Take by mouth daily., Disp: , Rfl: 1 .  FLUOCINOLONE ACETONIDE SCALP 0.01 % OIL, Apply 1 application topically 2 (two) times a week. , Disp: , Rfl:  .  furosemide (LASIX) 40 MG tablet, Take 1 tablet by mouth daily., Disp: , Rfl:  .  gabapentin (NEURONTIN) 600 MG tablet, Take 600 mg by mouth at bedtime., Disp: , Rfl:  .  hydroquinone 4 % cream, APPLY TO AFFECTED AREA ON FACE EVERY NIGHT, Disp: , Rfl:  .  Insulin Glargine (LANTUS SOLOSTAR) 100 UNIT/ML Solostar Pen, Inject 26 Units into the skin at bedtime. (Patient taking differently: Inject 36 Units into the skin at bedtime. ), Disp: 15 pen, Rfl: 1 .  insulin lispro (HUMALOG) 100 UNIT/ML KiwkPen, Inject 3-6 Units into the skin 3 (three) times daily. According to blood sugar., Disp: , Rfl:  .  Insulin Pen Needle (FIFTY50 PEN NEEDLES) 31G X 5 MM MISC, BD Ultra-Fine Mini Pen Needle 31 gauge x 3/16", Disp: , Rfl:  .  KLOR-CON M20 20 MEQ tablet, Take 40 mEq by mouth daily. , Disp: , Rfl:  .  L-Methylfolate-Algae (L-METHYLFOLATE FORTE) 15-90.314 MG CAPS, Take 1 tablet by mouth at bedtime., Disp: , Rfl:  .  metFORMIN (GLUCOPHAGE-XR) 500 MG 24 hr tablet, Take 1,000 mg by mouth 2 (two) times daily., Disp: , Rfl:  .  NOVOLOG FLEXPEN 100 UNIT/ML FlexPen, INJECT 3 TO 12 UNITS SUBCUTANEOUSLY 3 TIMES A DAY, Disp: , Rfl:  .  telmisartan (MICARDIS) 80 MG tablet, Take 80 mg by mouth daily. , Disp: , Rfl:  .  traZODone (DESYREL) 50 MG tablet, 4 tablets at night, Disp: , Rfl:  .  triamcinolone (NASACORT) 55 MCG/ACT AERO nasal inhaler, Place 2 sprays into the nose daily., Disp: , Rfl:  .  triamcinolone cream (KENALOG) 0.1 %, triamcinolone acetonide 0.1 % topical cream, Disp: , Rfl:  .  TRINTELLIX 20 MG TABS tablet, Take 20 mg by mouth  daily., Disp: , Rfl: 3 .  zaleplon (SONATA) 10 MG capsule, Take 20 mg by mouth at bedtime., Disp: , Rfl:  No current facility-administered medications for this visit.  Facility-Administered Medications Ordered in Other Visits:  .  gadopentetate dimeglumine (MAGNEVIST) injection 14 mL, 14 mL, Intravenous, Once PRN, Melvenia Beam, MD   Vital signs in last 24 hrs: Vitals:   06/02/20 1042  BP: 128/66  Pulse: 66  SpO2: 100%    Physical Exam  No acute distress.  Comfortable and conversational, somewhat blunted affect as before  HEENT: sclera anicteric, oral mucosa moist without lesions  Neck: supple, no thyromegaly, JVD or lymphadenopathy  Cardiac: RRR without murmurs, S1S2 heard, no peripheral edema  Pulm: clear to auscultation bilaterally, normal RR and effort noted  Abdomen: soft, no tenderness, with active bowel sounds. No guarding or palpable hepatosplenomegaly.  Lap band port felt right mid abdomen  Skin; warm and dry, no jaundice or rash Rectal: Normal external, normal DRE.  Scant light brown stool in rectal vault, heme-negative but very little specimen to check.  Labs:  Labs from 05/28/2020 at [facility] primary care  WBC 6.5, hemoglobin 8.4 (MCV 73, RDW 21), platelet 244  CMP normal (except BUN low at 5) ___________________________________________ Radiologic studies:   ____________________________________________ Other:   _____________________________________________ Assessment & Plan  Assessment: Encounter Diagnoses  Name Primary?  . Iron deficiency anemia due to chronic blood loss Yes  . Melena   . Upper abdominal pain    Acute on chronic iron deficiency anemia despite iron supplement.  Possibly recent melena, difficult to tell since she is on chronic iron tablets.  Regular aspirin and NSAID use.  Has LapBand placed 2007, so must consider malposition or ulceration that could cause both upper abdominal pain and anemia.  Plan: Upper endoscopy within next  7 days.  She was agreeable after discussion of procedure and risks.  The benefits and risks of the planned procedure were described in detail with the patient or (when appropriate) their health care proxy.  Risks were outlined as including, but not limited to, bleeding, infection, perforation, adverse medication reaction leading to cardiac or pulmonary decompensation, pancreatitis (if ERCP).  The limitation of incomplete mucosal visualization was also discussed.  No guarantees or warranties were given.  Stop aspirin and Aleve  Increase iron to twice a day  Samples of Dexilant were given to take once a day, starting today.   30 minutes were spent on this encounter (including chart review, history/exam, counseling/coordination of care, and documentation)  Nelida Meuse III

## 2020-06-02 NOTE — Patient Instructions (Addendum)
If you are age 62 or older, your body mass index should be between 23-30. Your Body mass index is 29.07 kg/m. If this is out of the aforementioned range listed, please consider follow up with your Primary Care Provider.  If you are age 37 or younger, your body mass index should be between 19-25. Your Body mass index is 29.07 kg/m. If this is out of the aformentioned range listed, please consider follow up with your Primary Care Provider.   You have been scheduled for an endoscopy. Please follow written instructions given to you at your visit today. If you use inhalers (even only as needed), please bring them with you on the day of your procedure.  Start Dexilant 1 a day. ( samples provided #10 )   Stop Aspirin  Stop Aleve  Increase the iron to twice a day.  It was a pleasure to see you today!  Dr. Loletha Carrow

## 2020-06-03 ENCOUNTER — Other Ambulatory Visit: Payer: Self-pay

## 2020-06-03 ENCOUNTER — Encounter: Payer: Self-pay | Admitting: Gastroenterology

## 2020-06-03 ENCOUNTER — Ambulatory Visit (AMBULATORY_SURGERY_CENTER): Payer: Federal, State, Local not specified - PPO | Admitting: Gastroenterology

## 2020-06-03 ENCOUNTER — Telehealth: Payer: Self-pay

## 2020-06-03 VITALS — BP 133/80 | HR 64 | Temp 98.6°F | Resp 18 | Ht 64.0 in | Wt 169.0 lb

## 2020-06-03 DIAGNOSIS — D500 Iron deficiency anemia secondary to blood loss (chronic): Secondary | ICD-10-CM | POA: Diagnosis not present

## 2020-06-03 DIAGNOSIS — R1010 Upper abdominal pain, unspecified: Secondary | ICD-10-CM | POA: Diagnosis not present

## 2020-06-03 DIAGNOSIS — K2210 Ulcer of esophagus without bleeding: Secondary | ICD-10-CM

## 2020-06-03 DIAGNOSIS — D509 Iron deficiency anemia, unspecified: Secondary | ICD-10-CM | POA: Diagnosis not present

## 2020-06-03 MED ORDER — SODIUM CHLORIDE 0.9 % IV SOLN: 500.0000 mL | Freq: Once | INTRAVENOUS | Status: DC

## 2020-06-03 MED ORDER — OMEPRAZOLE 40 MG PO CPDR
40.0000 mg | DELAYED_RELEASE_CAPSULE | Freq: Two times a day (BID) | ORAL | 3 refills | Status: DC
Start: 2020-06-03 — End: 2020-08-12

## 2020-06-03 NOTE — Op Note (Signed)
[facility] Patient Name: Amy [name] Procedure Date: 06/03/2020 9:42 AM MRN: [id number] Endoscopist: [place]. Loletha Carrow , MD Age: 64 Referring MD:  Date of Birth: Jan 17, 1956 Gender: Female Account #: [id number] Procedure:                Upper GI endoscopy Indications:              Upper abdominal pain (2-3 months), Iron deficiency                            anemia secondary to chronic blood loss, Melena                            (possible recently - difficult for patient to                            determine while on oral iron). Clinical details in                            06/02/20 office note                           LapBand device placed in 2007 Medicines:                Monitored Anesthesia Care Procedure:                Pre-Anesthesia Assessment:                           - Prior to the procedure, a History and Physical                            was performed, and patient medications and                            allergies were reviewed. The patient's tolerance of                            previous anesthesia was also reviewed. The risks                            and benefits of the procedure and the sedation                            options and risks were discussed with the patient.                            All questions were answered, and informed consent                            was obtained. Prior Anticoagulants: The patient has                            taken no previous anticoagulant or antiplatelet  agents except for aspirin. ASA Grade Assessment:                            III - A patient with severe systemic disease. After                            reviewing the risks and benefits, the patient was                            deemed in satisfactory condition to undergo the                            procedure.                           After obtaining informed consent, the endoscope was                            passed  under direct vision. Throughout the                            procedure, the patient's blood pressure, pulse, and                            oxygen saturations were monitored continuously. The                            Endoscope was introduced through the mouth, and                            advanced to the second part of duodenum. The upper                            GI endoscopy was accomplished without difficulty.                            The patient tolerated the procedure well. Scope In: Scope Out: Findings:                 One large cratered esophageal ulcer with no                            bleeding and no stigmata of recent bleeding was                            found at the gastroesophageal junction. There was                            also esophagitis.                           Extrinsic compression on the stomach was found in                            the cardia from  a LapBand device. The scope passed                            with mild resistance, and there was no evidence of                            the device invading the gastric lumen.                           The exam of the stomach was otherwise normal.                           The cardia and gastric fundus were normal on                            retroflexion.                           The examined duodenum was normal. Complications:            No immediate complications. Estimated Blood Loss:     Estimated blood loss: none. Impression:               - Esophageal ulcer with no bleeding and no stigmata                            of recent bleeding.                           - Extrinsic compression in the cardia from LapBand                            device.                           - Normal examined duodenum.                           - No specimens collected.                           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 Recommendation:           - Patient has a contact number available for                            emergencies. The signs and symptoms of potential                            delayed complications were discussed with the                            patient. Return to normal activities tomorrow.  Written discharge instructions were provided to the                            patient.                           - Soft diet until Lapband is deflated, then resume                            regular diet.                           - Use Prilosec (omeprazole) 40 mg PO BID. Disp#60,                            RF 3                           - Return to primary care physician at appointment                            to be scheduled to check CBC and iron levels in 3-4                            weeks. Patient was instructed yesterday to increase                            iron tablet to twice daily. If hemoglobin and iron                            levels not improving as expected on oral iron,                            refer patient to Hematology for IV iron treatments.                            (PCP will be contacted by endoscopist and patient                            asked to contact PCP as well).                           - Refer to a surgeon at appointment to be                            scheduled. (Dr. Johnathan Hausen). Completely deflate                            Lapband. If already completely deflated, then                            device should be removed.(Endoscopist will contact  surgeon and send referral)                           - Patient will need repeat EGD to assess healing,                            timing TBD after surgical consultation and                            attention to LapBand device. Rukaya Kleinschmidt L. Loletha Carrow, MD 06/03/2020 10:10:43 AM This report has been signed electronically.

## 2020-06-03 NOTE — Progress Notes (Signed)
Vital signs checked by:CW ? ?The medical and surgical history was reviewed and verified with the patient. ? ?

## 2020-06-03 NOTE — Progress Notes (Signed)
PT taken to PACU. Monitors in place. VSS. Report given to RN. 

## 2020-06-03 NOTE — Patient Instructions (Signed)
Begin Omeprazole 40mg  by mouth twice a day after finishing sample of Dexilant  Follow soft diet until Lapband is deflated    YOU HAD AN ENDOSCOPIC PROCEDURE TODAY AT [place]:   Refer to the procedure report that was given to you for any specific questions about what was found during the examination.  If the procedure report does not answer your questions, please call your gastroenterologist to clarify.  If you requested that your care partner not be given the details of your procedure findings, then the procedure report has been included in a sealed envelope for you to review at your convenience later.  YOU SHOULD EXPECT: Some feelings of bloating in the abdomen. Passage of more gas than usual.  Walking can help get rid of the air that was put into your GI tract during the procedure and reduce the bloating. If you had a lower endoscopy (such as a colonoscopy or flexible sigmoidoscopy) you may notice spotting of blood in your stool or on the toilet paper. If you underwent a bowel prep for your procedure, you may not have a normal bowel movement for a few days.  Please Note:  You might notice some irritation and congestion in your nose or some drainage.  This is from the oxygen used during your procedure.  There is no need for concern and it should clear up in a day or so.  SYMPTOMS TO REPORT IMMEDIATELY:   Following upper endoscopy (EGD)  Vomiting of blood or coffee ground material  New chest pain or pain under the shoulder blades  Painful or persistently difficult swallowing  New shortness of breath  Fever of 100F or higher  Black, tarry-looking stools  For urgent or emergent issues, a gastroenterologist can be reached at any hour by calling [phone]. Do not use MyChart messaging for urgent concerns.    DIET:  Follow a soft diet until Lapband deflated.  Drink plenty of fluids but you should avoid alcoholic beverages for 24 hours.  ACTIVITY:  You should plan to  take it easy for the rest of today and you should NOT DRIVE or use heavy machinery until tomorrow (because of the sedation medicines used during the test).    FOLLOW UP: Our staff will call the number listed on your records 48-72 hours following your procedure to check on you and address any questions or concerns that you may have regarding the information given to you following your procedure. If we do not reach you, we will leave a message.  We will attempt to reach you two times.  During this call, we will ask if you have developed any symptoms of COVID 19. If you develop any symptoms (ie: fever, flu-like symptoms, shortness of breath, cough etc.) before then, please call [phone].  If you test positive for Covid 19 in the 2 weeks post procedure, please call and report this information to [place].    If any biopsies were taken you will be contacted by phone or by letter within the next 1-3 weeks.  Please call us at [phone] if you have not heard about the biopsies in 3 weeks.    SIGNATURES/CONFIDENTIALITY: You and/or your care partner have signed paperwork which will be entered into your electronic medical record.  These signatures attest to the fact that that the information above on your After Visit Summary has been reviewed and is understood.  Full responsibility of the confidentiality of this discharge information lies with you and/or your care-partner.

## 2020-06-03 NOTE — Telephone Encounter (Signed)
-----   Message from Doran Stabler, MD sent at 06/03/2020 12:03 PM EDT ----- Please send a referral to Dr. Johnathan Hausen at [facility] for: esophageal ulcer and upper abdominal pain, patient needs LapBand deflated

## 2020-06-03 NOTE — Telephone Encounter (Signed)
Referral and records faxed to [facility] for next available appt with Dr. Johnathan Hausen.

## 2020-06-04 DIAGNOSIS — F332 Major depressive disorder, recurrent severe without psychotic features: Secondary | ICD-10-CM | POA: Diagnosis not present

## 2020-06-07 ENCOUNTER — Telehealth: Payer: Self-pay

## 2020-06-07 NOTE — Telephone Encounter (Signed)
  Follow up Call-  Call back number 06/03/2020 12/26/2019  Post procedure Call Back phone  # [phone]  Permission to leave phone message Yes Yes  Some recent data might be hidden     Patient questions:  Do you have a fever, pain , or abdominal swelling? No. Pain Score  0 *  Have you tolerated food without any problems? Yes.    Have you been able to return to your normal activities? Yes.    Do you have any questions about your discharge instructions: Diet   No. Medications  No. Follow up visit  No.  Do you have questions or concerns about your Care? No.  Actions: * If pain score is 4 or above: No action needed, pain <4.  1. Have you developed a fever since your procedure? No   2.   Have you had an respiratory symptoms (SOB or cough) since your procedure? no  3.   Have you tested positive for COVID 19 since your procedure no  4.   Have you had any family members/close contacts diagnosed with the COVID 19 since your procedure?  no   If yes to any of these questions please route to Joylene John, RN and Joella Prince, RN

## 2020-06-08 DIAGNOSIS — F332 Major depressive disorder, recurrent severe without psychotic features: Secondary | ICD-10-CM | POA: Diagnosis not present

## 2020-06-09 DIAGNOSIS — Z4651 Encounter for fitting and adjustment of gastric lap band: Secondary | ICD-10-CM | POA: Diagnosis not present

## 2020-06-09 DIAGNOSIS — K209 Esophagitis, unspecified without bleeding: Secondary | ICD-10-CM | POA: Diagnosis not present

## 2020-06-11 ENCOUNTER — Other Ambulatory Visit (HOSPITAL_COMMUNITY): Payer: Self-pay | Admitting: *Deleted

## 2020-06-11 DIAGNOSIS — D509 Iron deficiency anemia, unspecified: Secondary | ICD-10-CM | POA: Diagnosis not present

## 2020-06-11 DIAGNOSIS — F332 Major depressive disorder, recurrent severe without psychotic features: Secondary | ICD-10-CM | POA: Diagnosis not present

## 2020-06-14 ENCOUNTER — Other Ambulatory Visit: Payer: Self-pay

## 2020-06-14 ENCOUNTER — Encounter (HOSPITAL_COMMUNITY)
Admission: RE | Admit: 2020-06-14 | Discharge: 2020-06-14 | Disposition: A | Discharge status: Home / Self Care | Payer: Federal, State, Local not specified - PPO | Source: Ambulatory Visit | Attending: Family Medicine | Admitting: Family Medicine

## 2020-06-14 DIAGNOSIS — D509 Iron deficiency anemia, unspecified: Secondary | ICD-10-CM | POA: Insufficient documentation

## 2020-06-14 MED ORDER — SODIUM CHLORIDE 0.9 % IV SOLN
510.0000 mg | INTRAVENOUS | Status: DC
  Administered 2020-06-14: 510 mg via INTRAVENOUS
  Filled 2020-06-14: qty 510

## 2020-06-14 NOTE — Discharge Instructions (Signed)

## 2020-06-15 DIAGNOSIS — F332 Major depressive disorder, recurrent severe without psychotic features: Secondary | ICD-10-CM | POA: Diagnosis not present

## 2020-06-16 ENCOUNTER — Telehealth: Payer: Self-pay | Admitting: Gastroenterology

## 2020-06-16 ENCOUNTER — Other Ambulatory Visit: Payer: Self-pay

## 2020-06-16 NOTE — Telephone Encounter (Signed)
Dexilant often cost-prohibitive.  Omeprazole 40 mg, one tablet twice daily before breakfast and supper.    Disp#60,  RF 2

## 2020-06-16 NOTE — Telephone Encounter (Signed)
I rec'd a message that this patient had her LapBand deflated by Dr. Hassell Done. He was planning to partially re-inflate it in 2 weeks, but I asked him to keep it deflated until further notice.  Amy [name] should be having regular blood checks with her PCP, Dr. Harlan Stains, because she was also going to be receiving IV iron.  Please make this patient at clinic appointment with me in late November or early December, and I will need to get copies of her CBCs from primary care between now and then. At the follow-up appointment I will see how she is feeling and plan a repeat upper endoscopy.

## 2020-06-16 NOTE — Progress Notes (Signed)
error 

## 2020-06-16 NOTE — Telephone Encounter (Signed)
Spoke with patient and she is aware that Dr. Loletha Carrow is recommending Omeprazole at this time as Dexilant can be expensive. Pt verbalized understanding.  RX was sent in on 06/03/20 after patient's procedure.

## 2020-06-16 NOTE — Telephone Encounter (Signed)
Spoke with patient regarding recommendations as outlined below.  Patient is scheduled for a follow up on 07/27/20 at 1:20 PM.   Fax sent to PCPs office requesting all labs that include a CBC be faxed to Dr. Loletha Carrow' attention for review, advised that patient has an appt on 07/27/20.   Patient was given samples of Dexilant at her last office visit, she is requesting a prescription at this time. Please advise, thank you

## 2020-06-17 DIAGNOSIS — F332 Major depressive disorder, recurrent severe without psychotic features: Secondary | ICD-10-CM | POA: Diagnosis not present

## 2020-06-18 ENCOUNTER — Ambulatory Visit
Admission: RE | Admit: 2020-06-18 | Discharge: 2020-06-18 | Disposition: A | Discharge status: Home / Self Care | Payer: Federal, State, Local not specified - PPO | Source: Ambulatory Visit | Attending: Family Medicine | Admitting: Family Medicine

## 2020-06-18 DIAGNOSIS — R350 Frequency of micturition: Secondary | ICD-10-CM | POA: Diagnosis not present

## 2020-06-18 DIAGNOSIS — R195 Other fecal abnormalities: Secondary | ICD-10-CM | POA: Diagnosis not present

## 2020-06-18 DIAGNOSIS — K3189 Other diseases of stomach and duodenum: Secondary | ICD-10-CM | POA: Diagnosis not present

## 2020-06-18 DIAGNOSIS — R1010 Upper abdominal pain, unspecified: Secondary | ICD-10-CM

## 2020-06-18 DIAGNOSIS — M4316 Spondylolisthesis, lumbar region: Secondary | ICD-10-CM | POA: Diagnosis not present

## 2020-06-18 MED ORDER — IOPAMIDOL (ISOVUE-300) INJECTION 61%
100.0000 mL | Freq: Once | INTRAVENOUS | Status: AC | PRN
  Administered 2020-06-18: 100 mL via INTRAVENOUS

## 2020-06-21 ENCOUNTER — Encounter (HOSPITAL_COMMUNITY)
Admission: RE | Admit: 2020-06-21 | Discharge: 2020-06-21 | Disposition: A | Discharge status: Home / Self Care | Payer: Federal, State, Local not specified - PPO | Source: Ambulatory Visit | Attending: Family Medicine | Admitting: Family Medicine

## 2020-06-21 ENCOUNTER — Other Ambulatory Visit: Payer: Self-pay

## 2020-06-21 ENCOUNTER — Other Ambulatory Visit: Payer: Federal, State, Local not specified - PPO

## 2020-06-21 DIAGNOSIS — D509 Iron deficiency anemia, unspecified: Secondary | ICD-10-CM | POA: Diagnosis not present

## 2020-06-21 MED ORDER — SODIUM CHLORIDE 0.9 % IV SOLN
510.0000 mg | INTRAVENOUS | Status: AC
  Administered 2020-06-21: 510 mg via INTRAVENOUS
  Filled 2020-06-21: qty 510

## 2020-06-23 DIAGNOSIS — F332 Major depressive disorder, recurrent severe without psychotic features: Secondary | ICD-10-CM | POA: Diagnosis not present

## 2020-06-24 ENCOUNTER — Encounter: Payer: Self-pay | Admitting: Gastroenterology

## 2020-06-25 DIAGNOSIS — F332 Major depressive disorder, recurrent severe without psychotic features: Secondary | ICD-10-CM | POA: Diagnosis not present

## 2020-06-27 DIAGNOSIS — Z20822 Contact with and (suspected) exposure to covid-19: Secondary | ICD-10-CM | POA: Diagnosis not present

## 2020-06-29 ENCOUNTER — Other Ambulatory Visit: Payer: Self-pay | Admitting: Adult Health

## 2020-06-29 ENCOUNTER — Telehealth: Payer: Self-pay | Admitting: Nurse Practitioner

## 2020-06-29 ENCOUNTER — Encounter: Payer: Self-pay | Admitting: Nurse Practitioner

## 2020-06-29 DIAGNOSIS — U071 COVID-19: Secondary | ICD-10-CM

## 2020-06-29 NOTE — Telephone Encounter (Signed)
Called to Discuss with patient about Covid symptoms and the use of a monoclonal antibody infusion for those with mild to moderate Covid symptoms and at a high risk of hospitalization.     Pt is qualified for this infusion at the [facility] center due to co-morbid conditions and/or a member of an at-risk group.     Unable to reach pt. Left message to return call. Sent [employer].   Beckey Rutter, [place], AGNP-C [phone] ([place])

## 2020-06-29 NOTE — Progress Notes (Signed)
I connected by phone with Amy [name] on 06/29/2020 at 4:28 PM to discuss the potential use of a new treatment for mild to moderate COVID-19 viral infection in non-hospitalized patients.  This patient is a 64 y.o. female that meets the FDA criteria for Emergency Use Authorization of COVID monoclonal antibody sotrovimab, casirivimab/imdevimab, or bamlanivimab/eteseviamb.  Has a (+) direct SARS-CoV-2 viral test result  Has mild or moderate COVID-19   Is NOT hospitalized due to COVID-19  Is within 10 days of symptom onset  Has at least one of the high risk factor(s) for progression to severe COVID-19 and/or hospitalization as defined in EUA.  Specific high risk criteria : BMI > 25, Diabetes and Cardiovascular disease or hypertension   Sx onset 10/30   I have spoken and communicated the following to the patient or parent/caregiver regarding COVID monoclonal antibody treatment:  1. FDA has authorized the emergency use for the treatment of mild to moderate COVID-19 in adults and pediatric patients with positive results of direct SARS-CoV-2 viral testing who are 55 years of age and older weighing at least 40 kg, and who are at high risk for progressing to severe COVID-19 and/or hospitalization.  2. The significant known and potential risks and benefits of COVID monoclonal antibody, and the extent to which such potential risks and benefits are unknown.  3. Information on available alternative treatments and the risks and benefits of those alternatives, including clinical trials.  4. Patients treated with COVID monoclonal antibody should continue to self-isolate and use infection control measures (e.g., wear mask, isolate, social distance, avoid sharing personal items, clean and disinfect "high touch" surfaces, and frequent handwashing) according to CDC guidelines.   5. The patient or parent/caregiver has the option to accept or refuse COVID monoclonal antibody treatment. 6. Cost reviewed in  detail.    After reviewing this information with the patient, the patient has agreed to receive one of the available covid 19 monoclonal antibodies and will be provided an appropriate fact sheet prior to infusion. Scot Dock, NP 06/29/2020 4:28 PM

## 2020-06-30 ENCOUNTER — Ambulatory Visit (HOSPITAL_COMMUNITY)
Admission: RE | Admit: 2020-06-30 | Discharge: 2020-06-30 | Disposition: A | Discharge status: Home / Self Care | Payer: Federal, State, Local not specified - PPO | Source: Ambulatory Visit | Attending: Pulmonary Disease | Admitting: Pulmonary Disease

## 2020-06-30 DIAGNOSIS — U071 COVID-19: Secondary | ICD-10-CM | POA: Insufficient documentation

## 2020-06-30 DIAGNOSIS — I10 Essential (primary) hypertension: Secondary | ICD-10-CM | POA: Insufficient documentation

## 2020-06-30 DIAGNOSIS — E119 Type 2 diabetes mellitus without complications: Secondary | ICD-10-CM | POA: Insufficient documentation

## 2020-06-30 MED ORDER — SODIUM CHLORIDE 0.9 % IV SOLN: INTRAVENOUS | Status: DC | PRN

## 2020-06-30 MED ORDER — FAMOTIDINE IN NACL 20-0.9 MG/50ML-% IV SOLN: 20.0000 mg | Freq: Once | INTRAVENOUS | Status: DC | PRN

## 2020-06-30 MED ORDER — DIPHENHYDRAMINE HCL 50 MG/ML IJ SOLN: 50.0000 mg | Freq: Once | INTRAMUSCULAR | Status: DC | PRN

## 2020-06-30 MED ORDER — ALBUTEROL SULFATE HFA 108 (90 BASE) MCG/ACT IN AERS: 2.0000 | INHALATION_SPRAY | Freq: Once | RESPIRATORY_TRACT | Status: DC | PRN

## 2020-06-30 MED ORDER — EPINEPHRINE 0.3 MG/0.3ML IJ SOAJ: 0.3000 mg | Freq: Once | INTRAMUSCULAR | Status: DC | PRN

## 2020-06-30 MED ORDER — SOTROVIMAB 500 MG/8ML IV SOLN
500.0000 mg | Freq: Once | INTRAVENOUS | Status: AC
  Administered 2020-06-30: 500 mg via INTRAVENOUS

## 2020-06-30 MED ORDER — METHYLPREDNISOLONE SODIUM SUCC 125 MG IJ SOLR: 125.0000 mg | Freq: Once | INTRAMUSCULAR | Status: DC | PRN

## 2020-06-30 NOTE — Discharge Instructions (Signed)

## 2020-06-30 NOTE — Progress Notes (Addendum)
  Diagnosis: COVID-19  Physician: Dr Joya Gaskins  Procedure: [facility] Infusion [facility] Med: Sotrovimab infusion - Provided patient with Sotrovimab fact sheet for patients, parents and caregivers prior to infusion  Complications: No immediate complications noted.  Discharge: Discharged home   Sim Boast 06/30/2020

## 2020-07-03 DIAGNOSIS — Z20822 Contact with and (suspected) exposure to covid-19: Secondary | ICD-10-CM | POA: Diagnosis not present

## 2020-07-06 DIAGNOSIS — F332 Major depressive disorder, recurrent severe without psychotic features: Secondary | ICD-10-CM | POA: Diagnosis not present

## 2020-07-07 DIAGNOSIS — F332 Major depressive disorder, recurrent severe without psychotic features: Secondary | ICD-10-CM | POA: Diagnosis not present

## 2020-07-08 DIAGNOSIS — F332 Major depressive disorder, recurrent severe without psychotic features: Secondary | ICD-10-CM | POA: Diagnosis not present

## 2020-07-09 DIAGNOSIS — F332 Major depressive disorder, recurrent severe without psychotic features: Secondary | ICD-10-CM | POA: Diagnosis not present

## 2020-07-12 DIAGNOSIS — F332 Major depressive disorder, recurrent severe without psychotic features: Secondary | ICD-10-CM | POA: Diagnosis not present

## 2020-07-13 DIAGNOSIS — E7800 Pure hypercholesterolemia, unspecified: Secondary | ICD-10-CM | POA: Diagnosis not present

## 2020-07-13 DIAGNOSIS — F332 Major depressive disorder, recurrent severe without psychotic features: Secondary | ICD-10-CM | POA: Diagnosis not present

## 2020-07-13 DIAGNOSIS — I10 Essential (primary) hypertension: Secondary | ICD-10-CM | POA: Diagnosis not present

## 2020-07-13 DIAGNOSIS — E1165 Type 2 diabetes mellitus with hyperglycemia: Secondary | ICD-10-CM | POA: Diagnosis not present

## 2020-07-14 DIAGNOSIS — F332 Major depressive disorder, recurrent severe without psychotic features: Secondary | ICD-10-CM | POA: Diagnosis not present

## 2020-07-15 DIAGNOSIS — F332 Major depressive disorder, recurrent severe without psychotic features: Secondary | ICD-10-CM | POA: Diagnosis not present

## 2020-07-16 DIAGNOSIS — E1165 Type 2 diabetes mellitus with hyperglycemia: Secondary | ICD-10-CM | POA: Diagnosis not present

## 2020-07-16 DIAGNOSIS — F332 Major depressive disorder, recurrent severe without psychotic features: Secondary | ICD-10-CM | POA: Diagnosis not present

## 2020-07-16 DIAGNOSIS — E7800 Pure hypercholesterolemia, unspecified: Secondary | ICD-10-CM | POA: Diagnosis not present

## 2020-07-16 DIAGNOSIS — I10 Essential (primary) hypertension: Secondary | ICD-10-CM | POA: Diagnosis not present

## 2020-07-16 DIAGNOSIS — E559 Vitamin D deficiency, unspecified: Secondary | ICD-10-CM | POA: Diagnosis not present

## 2020-07-19 DIAGNOSIS — F332 Major depressive disorder, recurrent severe without psychotic features: Secondary | ICD-10-CM | POA: Diagnosis not present

## 2020-07-21 DIAGNOSIS — F332 Major depressive disorder, recurrent severe without psychotic features: Secondary | ICD-10-CM | POA: Diagnosis not present

## 2020-07-23 DIAGNOSIS — F332 Major depressive disorder, recurrent severe without psychotic features: Secondary | ICD-10-CM | POA: Diagnosis not present

## 2020-07-26 DIAGNOSIS — F332 Major depressive disorder, recurrent severe without psychotic features: Secondary | ICD-10-CM | POA: Diagnosis not present

## 2020-07-27 ENCOUNTER — Encounter: Payer: Self-pay | Admitting: Gastroenterology

## 2020-07-27 ENCOUNTER — Other Ambulatory Visit (INDEPENDENT_AMBULATORY_CARE_PROVIDER_SITE_OTHER): Payer: Federal, State, Local not specified - PPO

## 2020-07-27 ENCOUNTER — Ambulatory Visit (INDEPENDENT_AMBULATORY_CARE_PROVIDER_SITE_OTHER): Payer: Federal, State, Local not specified - PPO | Admitting: Gastroenterology

## 2020-07-27 VITALS — BP 120/62 | HR 80 | Ht 64.0 in | Wt 170.8 lb

## 2020-07-27 DIAGNOSIS — R1013 Epigastric pain: Secondary | ICD-10-CM

## 2020-07-27 DIAGNOSIS — K2210 Ulcer of esophagus without bleeding: Secondary | ICD-10-CM | POA: Diagnosis not present

## 2020-07-27 DIAGNOSIS — D500 Iron deficiency anemia secondary to blood loss (chronic): Secondary | ICD-10-CM

## 2020-07-27 DIAGNOSIS — F332 Major depressive disorder, recurrent severe without psychotic features: Secondary | ICD-10-CM | POA: Diagnosis not present

## 2020-07-27 LAB — CBC WITH DIFFERENTIAL/PLATELET
Basophils Absolute: 0.1 10*3/uL (ref 0.0–0.1)
Basophils Relative: 0.9 % (ref 0.0–3.0)
Eosinophils Absolute: 0.1 10*3/uL (ref 0.0–0.7)
Eosinophils Relative: 1.3 % (ref 0.0–5.0)
HCT: 39.2 % (ref 36.0–46.0)
Hemoglobin: 12.4 g/dL (ref 12.0–15.0)
Lymphocytes Relative: 22.9 % (ref 12.0–46.0)
Lymphs Abs: 1.5 10*3/uL (ref 0.7–4.0)
MCHC: 31.5 g/dL (ref 30.0–36.0)
MCV: 80.2 fl (ref 78.0–100.0)
Monocytes Absolute: 0.5 10*3/uL (ref 0.1–1.0)
Monocytes Relative: 7 % (ref 3.0–12.0)
Neutro Abs: 4.5 10*3/uL (ref 1.4–7.7)
Neutrophils Relative %: 67.9 % (ref 43.0–77.0)
Platelets: 204 10*3/uL (ref 150.0–400.0)
RBC: 4.88 Mil/uL (ref 3.87–5.11)
RDW: 25.1 % — ABNORMAL HIGH (ref 11.5–15.5)
WBC: 6.6 10*3/uL (ref 4.0–10.5)

## 2020-07-27 LAB — IBC + FERRITIN
Ferritin: 192.6 ng/mL (ref 10.0–291.0)
Iron: 83 ug/dL (ref 42–145)
Saturation Ratios: 23.8 % (ref 20.0–50.0)
Transferrin: 249 mg/dL (ref 212.0–360.0)

## 2020-07-27 NOTE — Patient Instructions (Addendum)
If you are age 62 or older, your body mass index should be between 23-30. Your Body mass index is 29.32 kg/m. If this is out of the aforementioned range listed, please consider follow up with your Primary Care Provider.  If you are age 24 or younger, your body mass index should be between 19-25. Your Body mass index is 29.32 kg/m. If this is out of the aformentioned range listed, please consider follow up with your Primary Care Provider.   Your provider has requested that you go to the basement level for lab work before leaving today. Press "B" on the elevator. The lab is located at the first door on the left as you exit the elevator.  You have been scheduled for an endoscopy. Please follow written instructions given to you at your visit today. If you use inhalers (even only as needed), please bring them with you on the day of your procedure.  Follow up pending the results of your labs and Endoscopy.  It was a pleasure to see you today!  Dr. Loletha Carrow

## 2020-07-27 NOTE — Progress Notes (Signed)
[facility] Progress Note  Chief Complaint: Upper abdominal pain and esophageal ulcer  Subjective  History: Amy [name] was last seen October 7 for an upper endoscopy.  She had chronic epigastric discomfort and iron deficiency anemia. 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 I spoke with her PCP entheses Dr. Harlan Stains) regarding the iron supplements and close monitor of hemoglobin, which has been done.  And he received oral iron and IV Feraheme.  I also communicated with Dr. Kaylyn Lim of [facility] who originally placed the LAP-BAND.  He saw Amy [name] in clinic and deflated the device.  He was originally planning to partially reinflated after couple of months, but I asked him not to do that at this point given the severity of endoscopic findings. Amy [name] was Covid positive on 06/27/2020 and received antibody infusion.  Persistent positive antibody test 07/03/2020 ________________________________________   Amy [name] has been feeling better since I last saw her.  She no longer has upper abdominal pain and feels like food is passing well.  The symptoms were difficult to characterize at first, but I think they were related to food getting hung up in the gastric cardia from the inflated LAP-BAND. Her husband had upper respiratory symptoms from Covid with "head cold" in the loss of smell, and he did not have any symptoms but tested positive and got antibody infusion. She denies nausea or vomiting or weight loss. ROS: Cardiovascular:  no chest pain Respiratory: no dyspnea Remainder of systems negative except as above The patient's Past Medical, Family and Social History were reviewed and are on file in the EMR.  Objective:  Med list reviewed  Current Outpatient Medications:    Acetaminophen (TYLENOL PO), Take 500 mg by mouth as needed., Disp: , Rfl:    aspirin 81 MG  tablet, Take 81 mg by mouth daily. , Disp: , Rfl:    cetirizine (ZYRTEC) 10 MG tablet, Take 10 mg by mouth daily. , Disp: , Rfl:    Continuous Blood Gluc Sensor (FREESTYLE LIBRE SENSOR SYSTEM) MISC, USE 1 SENSOR EVERY 10 DAYS, Disp: , Rfl: 5   Dulaglutide (TRULICITY) 1.5 UJ/8.1XB SOPN, Trulicity 1.5 JY/7.8 mL subcutaneous pen injector, Disp: , Rfl:    ergocalciferol (VITAMIN D2) 50000 UNITS capsule, Take 50,000 Units by mouth every Monday. , Disp: , Rfl:    FERREX 150 150 MG capsule, Take by mouth daily., Disp: , Rfl: 1   FLUOCINOLONE ACETONIDE SCALP 0.01 % OIL, Apply 1 application topically 2 (two) times a week. , Disp: , Rfl:    furosemide (LASIX) 40 MG tablet, Take 1 tablet by mouth daily., Disp: , Rfl:    gabapentin (NEURONTIN) 600 MG tablet, Take 600 mg by mouth at bedtime., Disp: , Rfl:    hydroquinone 4 % cream, APPLY TO AFFECTED AREA ON FACE EVERY NIGHT, Disp: , Rfl:    Insulin Glargine (LANTUS SOLOSTAR) 100 UNIT/ML Solostar Pen, Inject 26 Units into the skin at bedtime. (Patient taking differently: Inject 36 Units into the skin at bedtime. ), Disp: 15 pen, Rfl: 1   Insulin Pen Needle (FIFTY50 PEN NEEDLES) 31G X 5 MM MISC, BD Ultra-Fine Mini Pen Needle 31 gauge x 3/16", Disp: , Rfl:    KLOR-CON M20 20 MEQ tablet, Take 40 mEq by mouth daily. , Disp: , Rfl:    L-Methylfolate-Algae (L-METHYLFOLATE FORTE) 15-90.314 MG CAPS, Take 1 tablet by mouth at bedtime., Disp: ,  Rfl:    metFORMIN (GLUCOPHAGE-XR) 500 MG 24 hr tablet, Take 1,000 mg by mouth 2 (two) times daily., Disp: , Rfl:    NOVOLOG FLEXPEN 100 UNIT/ML FlexPen, INJECT 3 TO 12 UNITS SUBCUTANEOUSLY 3 TIMES A DAY, Disp: , Rfl:    omeprazole (PRILOSEC) 40 MG capsule, Take 1 capsule (40 mg total) by mouth 2 (two) times daily., Disp: 60 capsule, Rfl: 3   telmisartan (MICARDIS) 80 MG tablet, Take 80 mg by mouth daily. , Disp: , Rfl:    traZODone (DESYREL) 50 MG tablet, 4 tablets at night, Disp: , Rfl:    triamcinolone  (NASACORT) 55 MCG/ACT AERO nasal inhaler, Place 2 sprays into the nose daily., Disp: , Rfl:    triamcinolone cream (KENALOG) 0.1 %, triamcinolone acetonide 0.1 % topical cream, Disp: , Rfl:    TRINTELLIX 20 MG TABS tablet, Take 20 mg by mouth daily., Disp: , Rfl: 3   zaleplon (SONATA) 10 MG capsule, Take 20 mg by mouth at bedtime., Disp: , Rfl:  No current facility-administered medications for this visit.  Facility-Administered Medications Ordered in Other Visits:    gadopentetate dimeglumine (MAGNEVIST) injection 14 mL, 14 mL, Intravenous, Once PRN, Melvenia Beam, MD  Has been off aspirin since the endoscopy  Vital signs in last 24 hrs: Vitals:   07/27/20 1322  BP: 120/62  Pulse: 80    Physical Exam  Well-appearing.  Somewhat restricted affect as before  HEENT: sclera anicteric, oral mucosa moist without lesions  Neck: supple, no thyromegaly, JVD or lymphadenopathy  Cardiac: RRR without murmurs, S1S2 heard, no peripheral edema  Pulm: clear to auscultation bilaterally, normal RR and effort noted  Abdomen: soft, no tenderness, with active bowel sounds. No guarding or palpable hepatosplenomegaly.  Skin; warm and dry, no jaundice or rash Mild tremor right hand Labs:  Hemoglobin 8.4 on October 1, 9.7 on October 15 ___________________________________________ Radiologic studies:   ____________________________________________ Other:   _____________________________________________ Assessment & Plan  Assessment: Encounter Diagnoses  Name Primary?   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  This appears to been a source of chronic occult GI blood loss leading to anemia.  Plan: CBC and iron studies today.  She does not think she has had any  further lab work with Dr. Dema Severin since mid October. She asked about resuming aspirin, but I think we should wait until her endoscopy and then consult with PCP on how strongly that medicine is indicated. Repeat upper endoscopy within the next month.  She was agreeable after discussion of procedure and risks.  The benefits and risks of the planned procedure were described in detail with the patient or (when appropriate) their health care proxy.  Risks were outlined as including, but not limited to, bleeding, infection, perforation, adverse medication reaction leading to cardiac or pulmonary decompensation, pancreatitis (if ERCP).  The limitation of incomplete mucosal visualization was also discussed.  No guarantees or warranties were given.  She is scheduled to see Dr. Hassell Done in a couple of weeks, and I asked her to reschedule that till January so we can have upper endoscopy results first.  I am not yet sure whether the LAP-BAND should be even partially reinflated at any point.   30 minutes were spent on this encounter (including chart review, history/exam, counseling/coordination  of care, and documentation)  Nelida Meuse III  CC: Dr. Harlan Stains Dr. Johnathan Hausen

## 2020-07-28 DIAGNOSIS — F332 Major depressive disorder, recurrent severe without psychotic features: Secondary | ICD-10-CM | POA: Diagnosis not present

## 2020-07-29 DIAGNOSIS — F332 Major depressive disorder, recurrent severe without psychotic features: Secondary | ICD-10-CM | POA: Diagnosis not present

## 2020-07-30 DIAGNOSIS — F331 Major depressive disorder, recurrent, moderate: Secondary | ICD-10-CM | POA: Diagnosis not present

## 2020-07-30 DIAGNOSIS — F332 Major depressive disorder, recurrent severe without psychotic features: Secondary | ICD-10-CM | POA: Diagnosis not present

## 2020-08-02 DIAGNOSIS — F332 Major depressive disorder, recurrent severe without psychotic features: Secondary | ICD-10-CM | POA: Diagnosis not present

## 2020-08-03 DIAGNOSIS — F332 Major depressive disorder, recurrent severe without psychotic features: Secondary | ICD-10-CM | POA: Diagnosis not present

## 2020-08-04 DIAGNOSIS — F332 Major depressive disorder, recurrent severe without psychotic features: Secondary | ICD-10-CM | POA: Diagnosis not present

## 2020-08-05 DIAGNOSIS — F332 Major depressive disorder, recurrent severe without psychotic features: Secondary | ICD-10-CM | POA: Diagnosis not present

## 2020-08-06 DIAGNOSIS — F332 Major depressive disorder, recurrent severe without psychotic features: Secondary | ICD-10-CM | POA: Diagnosis not present

## 2020-08-09 DIAGNOSIS — F332 Major depressive disorder, recurrent severe without psychotic features: Secondary | ICD-10-CM | POA: Diagnosis not present

## 2020-08-10 DIAGNOSIS — F332 Major depressive disorder, recurrent severe without psychotic features: Secondary | ICD-10-CM | POA: Diagnosis not present

## 2020-08-11 ENCOUNTER — Other Ambulatory Visit: Payer: Self-pay | Admitting: Gastroenterology

## 2020-08-11 DIAGNOSIS — F332 Major depressive disorder, recurrent severe without psychotic features: Secondary | ICD-10-CM | POA: Diagnosis not present

## 2020-08-12 DIAGNOSIS — H2513 Age-related nuclear cataract, bilateral: Secondary | ICD-10-CM | POA: Diagnosis not present

## 2020-08-12 DIAGNOSIS — H52223 Regular astigmatism, bilateral: Secondary | ICD-10-CM | POA: Diagnosis not present

## 2020-08-12 DIAGNOSIS — F332 Major depressive disorder, recurrent severe without psychotic features: Secondary | ICD-10-CM | POA: Diagnosis not present

## 2020-08-12 DIAGNOSIS — H524 Presbyopia: Secondary | ICD-10-CM | POA: Diagnosis not present

## 2020-08-12 DIAGNOSIS — E119 Type 2 diabetes mellitus without complications: Secondary | ICD-10-CM | POA: Diagnosis not present

## 2020-08-12 DIAGNOSIS — H5213 Myopia, bilateral: Secondary | ICD-10-CM | POA: Diagnosis not present

## 2020-08-12 DIAGNOSIS — H43813 Vitreous degeneration, bilateral: Secondary | ICD-10-CM | POA: Diagnosis not present

## 2020-08-13 ENCOUNTER — Encounter: Payer: Self-pay | Admitting: Gastroenterology

## 2020-08-13 DIAGNOSIS — F332 Major depressive disorder, recurrent severe without psychotic features: Secondary | ICD-10-CM | POA: Diagnosis not present

## 2020-08-16 ENCOUNTER — Ambulatory Visit (AMBULATORY_SURGERY_CENTER): Payer: Medicare Other | Admitting: Gastroenterology

## 2020-08-16 ENCOUNTER — Encounter: Payer: Self-pay | Admitting: Gastroenterology

## 2020-08-16 ENCOUNTER — Other Ambulatory Visit: Payer: Self-pay

## 2020-08-16 VITALS — BP 115/67 | HR 61 | Temp 97.7°F | Resp 18 | Ht 64.0 in | Wt 170.0 lb

## 2020-08-16 DIAGNOSIS — K259 Gastric ulcer, unspecified as acute or chronic, without hemorrhage or perforation: Secondary | ICD-10-CM | POA: Diagnosis not present

## 2020-08-16 DIAGNOSIS — Z8711 Personal history of peptic ulcer disease: Secondary | ICD-10-CM | POA: Diagnosis not present

## 2020-08-16 DIAGNOSIS — D500 Iron deficiency anemia secondary to blood loss (chronic): Secondary | ICD-10-CM | POA: Diagnosis not present

## 2020-08-16 DIAGNOSIS — K2210 Ulcer of esophagus without bleeding: Secondary | ICD-10-CM

## 2020-08-16 DIAGNOSIS — F332 Major depressive disorder, recurrent severe without psychotic features: Secondary | ICD-10-CM | POA: Diagnosis not present

## 2020-08-16 MED ORDER — SODIUM CHLORIDE 0.9 % IV SOLN: 500.0000 mL | INTRAVENOUS | Status: DC

## 2020-08-16 NOTE — Progress Notes (Signed)
CW vitals and AG IV.  Pt's states no medical or surgical changes since previsit or office visit.

## 2020-08-16 NOTE — Patient Instructions (Signed)
Discharge instructions given. No specimen collected. Continue present medications. Decrease omeprazole to one tablet every other day. YOU HAD AN ENDOSCOPIC PROCEDURE TODAY AT [facility]:   Refer to the procedure report that was given to you for any specific questions about what was found during the examination.  If the procedure report does not answer your questions, please call your gastroenterologist to clarify.  If you requested that your care partner not be given the details of your procedure findings, then the procedure report has been included in a sealed envelope for you to review at your convenience later.  YOU SHOULD EXPECT: Some feelings of bloating in the abdomen. Passage of more gas than usual.  Walking can help get rid of the air that was put into your GI tract during the procedure and reduce the bloating. If you had a lower endoscopy (such as a colonoscopy or flexible sigmoidoscopy) you may notice spotting of blood in your stool or on the toilet paper. If you underwent a bowel prep for your procedure, you may not have a normal bowel movement for a few days.  Please Note:  You might notice some irritation and congestion in your nose or some drainage.  This is from the oxygen used during your procedure.  There is no need for concern and it should clear up in a day or so.  SYMPTOMS TO REPORT IMMEDIATELY:    Following upper endoscopy (EGD)  Vomiting of blood or coffee ground material  New chest pain or pain under the shoulder blades  Painful or persistently difficult swallowing  New shortness of breath  Fever of 100F or higher  Black, tarry-looking stools  For urgent or emergent issues, a gastroenterologist can be reached at any hour by calling [phone]. Do not use MyChart messaging for urgent concerns.    DIET:  We do recommend a small meal at first, but then you may proceed to your regular diet.  Drink plenty of fluids but you should avoid alcoholic  beverages for 24 hours.  ACTIVITY:  You should plan to take it easy for the rest of today and you should NOT DRIVE or use heavy machinery until tomorrow (because of the sedation medicines used during the test).    FOLLOW UP: Our staff will call the number listed on your records 48-72 hours following your procedure to check on you and address any questions or concerns that you may have regarding the information given to you following your procedure. If we do not reach you, we will leave a message.  We will attempt to reach you two times.  During this call, we will ask if you have developed any symptoms of COVID 19. If you develop any symptoms (ie: fever, flu-like symptoms, shortness of breath, cough etc.) before then, please call [phone].  If you test positive for Covid 19 in the 2 weeks post procedure, please call and report this information to [place].    If any biopsies were taken you will be contacted by phone or by letter within the next 1-3 weeks.  Please call us at [phone] if you have not heard about the biopsies in 3 weeks.    SIGNATURES/CONFIDENTIALITY: You and/or your care partner have signed paperwork which will be entered into your electronic medical record.  These signatures attest to the fact that that the information above on your After Visit Summary has been reviewed and is understood.  Full responsibility of the confidentiality of this discharge information lies with  you and/or your care-partner.

## 2020-08-16 NOTE — Progress Notes (Signed)
pt tolerated well. VSS. awake and to recovery. Report given to RN.  

## 2020-08-16 NOTE — Op Note (Signed)
Amy [name] Patient Name: Amy [name] Procedure Date: 08/16/2020 8:31 AM MRN: [id number] Endoscopist: McCoole. 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 Ulcer causing IDA. LapBand was                            completely deflated) Medicines:                Monitored Anesthesia Care Procedure:                Pre-Anesthesia Assessment:                           - Prior to the procedure, a History and Physical                            was performed, and patient medications and                            allergies were reviewed. The patient's tolerance of                            previous anesthesia was also reviewed. The risks                            and benefits of the procedure and the sedation                            options and risks were discussed with the patient.                            All questions were answered, and informed consent                            was obtained. Prior Anticoagulants: The patient has                            taken no previous anticoagulant or antiplatelet                            agents. ASA Grade Assessment: II - A patient with  mild systemic disease. After reviewing the risks                            and benefits, the patient was deemed in                            satisfactory condition to undergo the procedure.                           After obtaining informed consent, the endoscope was                            passed under direct vision. Throughout the                            procedure, the patient's blood pressure,  pulse, and                            oxygen saturations were monitored continuously. The                            Endoscope was introduced through the mouth, and                            advanced to the second part of duodenum. The upper                            GI endoscopy was accomplished without difficulty.                            The patient tolerated the procedure well. Scope In: Scope Out: Findings:                 The lumen of the esophagus was moderately dilated                            as before.                           There is no endoscopic evidence of ulcerations in                            the entire esophagus.                           Extrinsic compression on the stomach was found in                            the cardia (from the LapBand device).                           A small amount of food (residue) was found in the                            gastric body.  There is no endoscopic evidence of ulceration in                            the entire examined stomach (EGJ ulcer completely                            healed).                           The cardia and gastric fundus were normal on                            retroflexion.                           The examined duodenum was normal. Complications:            No immediate complications. Estimated Blood Loss:     Estimated blood loss: none. Impression:               - Dilation in the entire esophagus.                           - Extrinsic compression in the cardia.                           - A small amount of food (residue) in the stomach.                           - Normal examined duodenum.                           - No specimens collected. Recommendation:           - Patient has a contact number available for                            emergencies. The signs and symptoms of potential                            delayed complications were discussed with the                             patient. Return to normal activities tomorrow.                            Written discharge instructions were provided to the                            patient.                           - Resume previous diet.                           - Continue present medications, but decrease  omeprazole to one tablet every other day.                           - Return to primary care physician to follow up on                            anemia. Also discuss whether aspirin should be                            resumed. Aspirin can be resumed if felt to be                            strongly indicated.                           - My opinion is that the LapBand device NOT be even                            partially reinflated because it is causing an                            achalasia-like esophageal dysmotility. Desree Leap L. Loletha Carrow, MD 08/16/2020 8:55:38 AM This report has been signed electronically. CC Letter to:             Isabel Caprice Hassell Done MD

## 2020-08-17 DIAGNOSIS — E1165 Type 2 diabetes mellitus with hyperglycemia: Secondary | ICD-10-CM | POA: Diagnosis not present

## 2020-08-17 DIAGNOSIS — F332 Major depressive disorder, recurrent severe without psychotic features: Secondary | ICD-10-CM | POA: Diagnosis not present

## 2020-08-18 DIAGNOSIS — F332 Major depressive disorder, recurrent severe without psychotic features: Secondary | ICD-10-CM | POA: Diagnosis not present

## 2020-08-19 ENCOUNTER — Telehealth: Payer: Self-pay

## 2020-08-19 DIAGNOSIS — F332 Major depressive disorder, recurrent severe without psychotic features: Secondary | ICD-10-CM | POA: Diagnosis not present

## 2020-08-19 NOTE — Telephone Encounter (Signed)
  Follow up Call-  Call back number 08/16/2020 06/03/2020 12/26/2019  Post procedure Call Back phone  # [phone]  Permission to leave phone message Yes Yes Yes  Some recent data might be hidden     Patient questions:  Do you have a fever, pain , or abdominal swelling? No. Pain Score  0 *  Have you tolerated food without any problems? Yes.    Have you been able to return to your normal activities? Yes.    Do you have any questions about your discharge instructions: Diet   No. Medications  No. Follow up visit  No.  Do you have questions or concerns about your Care? No.  Actions: * If pain score is 4 or above: No action needed, pain <4.  Have you developed a fever since your procedure? No 2.   Have you had an respiratory symptoms (SOB or cough) since your procedure? No  3.   Have you tested positive for COVID 19 since your procedure No  4.   Have you had any family members/close contacts diagnosed with the COVID 19 since your procedure?  No   If yes to any of these questions please route to Joylene John, RN and Joella Prince, RN

## 2020-08-23 DIAGNOSIS — F332 Major depressive disorder, recurrent severe without psychotic features: Secondary | ICD-10-CM | POA: Diagnosis not present

## 2020-08-23 DIAGNOSIS — I10 Essential (primary) hypertension: Secondary | ICD-10-CM | POA: Diagnosis not present

## 2020-08-23 DIAGNOSIS — E785 Hyperlipidemia, unspecified: Secondary | ICD-10-CM | POA: Diagnosis not present

## 2020-08-23 DIAGNOSIS — K7689 Other specified diseases of liver: Secondary | ICD-10-CM | POA: Diagnosis not present

## 2020-08-23 DIAGNOSIS — E1165 Type 2 diabetes mellitus with hyperglycemia: Secondary | ICD-10-CM | POA: Diagnosis not present

## 2020-08-24 DIAGNOSIS — F332 Major depressive disorder, recurrent severe without psychotic features: Secondary | ICD-10-CM | POA: Diagnosis not present

## 2020-08-25 DIAGNOSIS — F332 Major depressive disorder, recurrent severe without psychotic features: Secondary | ICD-10-CM | POA: Diagnosis not present

## 2020-08-26 DIAGNOSIS — F332 Major depressive disorder, recurrent severe without psychotic features: Secondary | ICD-10-CM | POA: Diagnosis not present

## 2020-08-27 DIAGNOSIS — F332 Major depressive disorder, recurrent severe without psychotic features: Secondary | ICD-10-CM | POA: Diagnosis not present

## 2020-08-31 DIAGNOSIS — F3175 Bipolar disorder, in partial remission, most recent episode depressed: Secondary | ICD-10-CM | POA: Diagnosis not present

## 2020-08-31 DIAGNOSIS — D509 Iron deficiency anemia, unspecified: Secondary | ICD-10-CM | POA: Diagnosis not present

## 2020-08-31 DIAGNOSIS — I10 Essential (primary) hypertension: Secondary | ICD-10-CM | POA: Diagnosis not present

## 2020-08-31 DIAGNOSIS — R7401 Elevation of levels of liver transaminase levels: Secondary | ICD-10-CM | POA: Diagnosis not present

## 2020-08-31 DIAGNOSIS — E785 Hyperlipidemia, unspecified: Secondary | ICD-10-CM | POA: Diagnosis not present

## 2020-08-31 DIAGNOSIS — E1140 Type 2 diabetes mellitus with diabetic neuropathy, unspecified: Secondary | ICD-10-CM | POA: Diagnosis not present

## 2020-09-01 DIAGNOSIS — F4322 Adjustment disorder with anxiety: Secondary | ICD-10-CM | POA: Diagnosis not present

## 2020-09-03 DIAGNOSIS — F332 Major depressive disorder, recurrent severe without psychotic features: Secondary | ICD-10-CM | POA: Diagnosis not present

## 2020-09-08 DIAGNOSIS — F332 Major depressive disorder, recurrent severe without psychotic features: Secondary | ICD-10-CM | POA: Diagnosis not present

## 2020-09-10 DIAGNOSIS — F332 Major depressive disorder, recurrent severe without psychotic features: Secondary | ICD-10-CM | POA: Diagnosis not present

## 2020-09-15 DIAGNOSIS — F332 Major depressive disorder, recurrent severe without psychotic features: Secondary | ICD-10-CM | POA: Diagnosis not present

## 2020-09-17 DIAGNOSIS — F332 Major depressive disorder, recurrent severe without psychotic features: Secondary | ICD-10-CM | POA: Diagnosis not present

## 2020-09-23 DIAGNOSIS — H2511 Age-related nuclear cataract, right eye: Secondary | ICD-10-CM | POA: Diagnosis not present

## 2020-09-29 DIAGNOSIS — F332 Major depressive disorder, recurrent severe without psychotic features: Secondary | ICD-10-CM | POA: Diagnosis not present

## 2020-10-06 DIAGNOSIS — F332 Major depressive disorder, recurrent severe without psychotic features: Secondary | ICD-10-CM | POA: Diagnosis not present

## 2020-10-13 DIAGNOSIS — F332 Major depressive disorder, recurrent severe without psychotic features: Secondary | ICD-10-CM | POA: Diagnosis not present

## 2020-10-14 DIAGNOSIS — H2512 Age-related nuclear cataract, left eye: Secondary | ICD-10-CM | POA: Diagnosis not present

## 2020-10-27 DIAGNOSIS — F332 Major depressive disorder, recurrent severe without psychotic features: Secondary | ICD-10-CM | POA: Diagnosis not present

## 2020-11-10 DIAGNOSIS — F332 Major depressive disorder, recurrent severe without psychotic features: Secondary | ICD-10-CM | POA: Diagnosis not present

## 2020-12-01 DIAGNOSIS — Z4651 Encounter for fitting and adjustment of gastric lap band: Secondary | ICD-10-CM | POA: Diagnosis not present

## 2020-12-01 DIAGNOSIS — Z9884 Bariatric surgery status: Secondary | ICD-10-CM | POA: Diagnosis not present

## 2020-12-09 DIAGNOSIS — E785 Hyperlipidemia, unspecified: Secondary | ICD-10-CM | POA: Diagnosis not present

## 2020-12-09 DIAGNOSIS — D509 Iron deficiency anemia, unspecified: Secondary | ICD-10-CM | POA: Diagnosis not present

## 2020-12-09 DIAGNOSIS — E1140 Type 2 diabetes mellitus with diabetic neuropathy, unspecified: Secondary | ICD-10-CM | POA: Diagnosis not present

## 2020-12-09 DIAGNOSIS — I10 Essential (primary) hypertension: Secondary | ICD-10-CM | POA: Diagnosis not present

## 2020-12-09 DIAGNOSIS — R140 Abdominal distension (gaseous): Secondary | ICD-10-CM | POA: Diagnosis not present

## 2020-12-10 ENCOUNTER — Other Ambulatory Visit: Payer: Self-pay | Admitting: Family Medicine

## 2020-12-10 DIAGNOSIS — R140 Abdominal distension (gaseous): Secondary | ICD-10-CM

## 2020-12-13 ENCOUNTER — Ambulatory Visit
Admission: RE | Admit: 2020-12-13 | Discharge: 2020-12-13 | Disposition: A | Discharge status: Home / Self Care | Payer: Medicare Other | Source: Ambulatory Visit | Attending: Family Medicine | Admitting: Family Medicine

## 2020-12-13 ENCOUNTER — Other Ambulatory Visit: Payer: Self-pay

## 2020-12-13 DIAGNOSIS — R140 Abdominal distension (gaseous): Secondary | ICD-10-CM

## 2020-12-23 DIAGNOSIS — I10 Essential (primary) hypertension: Secondary | ICD-10-CM | POA: Diagnosis not present

## 2020-12-23 DIAGNOSIS — Z9884 Bariatric surgery status: Secondary | ICD-10-CM | POA: Diagnosis not present

## 2020-12-23 DIAGNOSIS — E1165 Type 2 diabetes mellitus with hyperglycemia: Secondary | ICD-10-CM | POA: Diagnosis not present

## 2020-12-23 DIAGNOSIS — E7800 Pure hypercholesterolemia, unspecified: Secondary | ICD-10-CM | POA: Diagnosis not present

## 2021-01-19 DIAGNOSIS — F332 Major depressive disorder, recurrent severe without psychotic features: Secondary | ICD-10-CM | POA: Diagnosis not present

## 2021-01-19 DIAGNOSIS — F3341 Major depressive disorder, recurrent, in partial remission: Secondary | ICD-10-CM | POA: Diagnosis not present

## 2021-01-26 DIAGNOSIS — F332 Major depressive disorder, recurrent severe without psychotic features: Secondary | ICD-10-CM | POA: Diagnosis not present

## 2021-02-08 DIAGNOSIS — F332 Major depressive disorder, recurrent severe without psychotic features: Secondary | ICD-10-CM | POA: Diagnosis not present

## 2021-02-11 DIAGNOSIS — Z4651 Encounter for fitting and adjustment of gastric lap band: Secondary | ICD-10-CM | POA: Diagnosis not present

## 2021-03-02 DIAGNOSIS — F332 Major depressive disorder, recurrent severe without psychotic features: Secondary | ICD-10-CM | POA: Diagnosis not present

## 2021-03-17 DIAGNOSIS — E785 Hyperlipidemia, unspecified: Secondary | ICD-10-CM | POA: Diagnosis not present

## 2021-03-17 DIAGNOSIS — F3175 Bipolar disorder, in partial remission, most recent episode depressed: Secondary | ICD-10-CM | POA: Diagnosis not present

## 2021-03-17 DIAGNOSIS — I10 Essential (primary) hypertension: Secondary | ICD-10-CM | POA: Diagnosis not present

## 2021-03-17 DIAGNOSIS — E1140 Type 2 diabetes mellitus with diabetic neuropathy, unspecified: Secondary | ICD-10-CM | POA: Diagnosis not present

## 2021-03-26 ENCOUNTER — Other Ambulatory Visit: Payer: Self-pay | Admitting: Gastroenterology

## 2021-03-30 DIAGNOSIS — Z1389 Encounter for screening for other disorder: Secondary | ICD-10-CM | POA: Diagnosis not present

## 2021-03-30 DIAGNOSIS — Z01419 Encounter for gynecological examination (general) (routine) without abnormal findings: Secondary | ICD-10-CM | POA: Diagnosis not present

## 2021-03-30 DIAGNOSIS — Z130 Encounter for screening for diseases of the blood and blood-forming organs and certain disorders involving the immune mechanism: Secondary | ICD-10-CM | POA: Diagnosis not present

## 2021-03-30 DIAGNOSIS — Z124 Encounter for screening for malignant neoplasm of cervix: Secondary | ICD-10-CM | POA: Diagnosis not present

## 2021-04-01 DIAGNOSIS — Z9884 Bariatric surgery status: Secondary | ICD-10-CM | POA: Diagnosis not present

## 2021-04-01 DIAGNOSIS — Z9889 Other specified postprocedural states: Secondary | ICD-10-CM | POA: Diagnosis not present

## 2021-04-01 DIAGNOSIS — Z4651 Encounter for fitting and adjustment of gastric lap band: Secondary | ICD-10-CM | POA: Diagnosis not present

## 2021-04-13 DIAGNOSIS — F332 Major depressive disorder, recurrent severe without psychotic features: Secondary | ICD-10-CM | POA: Diagnosis not present

## 2021-04-13 DIAGNOSIS — F3342 Major depressive disorder, recurrent, in full remission: Secondary | ICD-10-CM | POA: Diagnosis not present

## 2021-05-11 DIAGNOSIS — F332 Major depressive disorder, recurrent severe without psychotic features: Secondary | ICD-10-CM | POA: Diagnosis not present

## 2021-05-18 DIAGNOSIS — Z9884 Bariatric surgery status: Secondary | ICD-10-CM | POA: Diagnosis not present

## 2021-05-18 DIAGNOSIS — Z9889 Other specified postprocedural states: Secondary | ICD-10-CM | POA: Diagnosis not present

## 2021-05-25 DIAGNOSIS — F332 Major depressive disorder, recurrent severe without psychotic features: Secondary | ICD-10-CM | POA: Diagnosis not present

## 2021-06-06 DIAGNOSIS — H43813 Vitreous degeneration, bilateral: Secondary | ICD-10-CM | POA: Diagnosis not present

## 2021-06-06 DIAGNOSIS — E119 Type 2 diabetes mellitus without complications: Secondary | ICD-10-CM | POA: Diagnosis not present

## 2021-06-08 DIAGNOSIS — F332 Major depressive disorder, recurrent severe without psychotic features: Secondary | ICD-10-CM | POA: Diagnosis not present

## 2021-06-13 ENCOUNTER — Ambulatory Visit (INDEPENDENT_AMBULATORY_CARE_PROVIDER_SITE_OTHER): Payer: Medicare Other | Admitting: Neurology

## 2021-06-13 ENCOUNTER — Encounter: Payer: Self-pay | Admitting: Neurology

## 2021-06-13 VITALS — BP 131/80 | HR 69 | Ht 64.0 in | Wt 186.6 lb

## 2021-06-13 DIAGNOSIS — F39 Unspecified mood [affective] disorder: Secondary | ICD-10-CM | POA: Diagnosis not present

## 2021-06-13 DIAGNOSIS — R4189 Other symptoms and signs involving cognitive functions and awareness: Secondary | ICD-10-CM | POA: Diagnosis not present

## 2021-06-13 NOTE — Progress Notes (Addendum)
[facility]    Provider:  Dr Jaynee Eagles Referring Provider: Harlan Stains, MD, Dr. Chucky May Primary Care Physician:  Harlan Stains, MD  CC:  Memory problems  Interval update 02/25/2021: Here for follow up again on memory loss, we have seen her multiple times for this same complain last in 2020 where MRI brain was unremarkable. EEG was normal but she never followed my instructions to get formal memory testing. Today her MMSE is 28/30 which is better than last two, so not consistent with a neurodegenerative disorder.   HPI August 28, 2018: This is a patient who was seen over 3 years ago for memory problems and she returns today for reportedly similar and progressive(by report) symptoms) .  Patient has a history of significant resistant depression, bipolar disorder, anxiety, sleep apnea, diabetes, hypertension.  At the time of evaluation the symptoms were thought to be due to her significant mood disorders.  MRI of the brain was completed and lab work.  She was referred back again by Dr. Hosie Spangle her psychiatrist for the same issues.  At this point formal neurocognitive testing would be the best option to really show that this is related to mood, cannot rule out a degenerative neurocognitive process however I do feel as though this is due to medical conditions, anxiety, depression, bipolar, sedentary lifestyle another multifactorial as opposed to dementia. She still describes having trouble finding words and numbers. Ongoing for several months (but described this in 2017). She still reports memory issues, difficulty with daily activities of life. Today she remembers all her prescriptions and their doses. No signs or symptoms of sleep apnea. Since she was seen she cannot stay focused when she is reading. She can't remember what she is reading or comprehend. Getting worse in the last 3 years. No one has noticed. Lives with her husband. He knows when she takes her medicine then 5 minutes  later she can;t remember if she took her medicine. She snores heavily, husband says she snores she is tired but no morning headaches, no dry mouth in the morning. No FHx of dementia.  Personally reviewed CT of the head from May 20 18th which is similar to MRI from 2017 which showed no acute intracranial abnormalities, mild nonspecific white matter changes likely chronic small vessel ischemic disease, both appear stable from MRI on April 09, 2013.  I reviewed notes from Dr. Robina Ade who stated patient's has a history of resistant depression and other psychiatric and mood disorders, she appears to have significant memory issues, cannot count or perform serial sevens, difficulty with short-term memory, she is tried multiple medications which do not seem to help improve patient's psychiatric condition and at this time she would like her evaluated for any neurodegenerative disorder.  Also decreased focus.  HPI 01/28/2016:  ENJOLI TIDD is a 65 y.o. female here as a referral from Dr. Dema Severin for memory problems. PMHx resistant depression, HLD, bipolar, diabetic neuropathy, anxiety, htn, sleep apnea, diabetes. Started a year or two ago. At that time she was in her husband's garage and she "fell out" and hit her head on the concrete. No bleeding, bruising, afterwards she denies any post-concussive symptoms. She didn't pay attention after that but she feels like maybe this is when she has memory problems, unsure. She can put things down and doesn't remember where she places it. Husband pays the bills. Other are already on automatic pay, she forgets if she took her medicine. She goes into the kitchen and forgets what she  was there for. She leaves home in the morning and can't remember how she got to work. Sometimes she feels like she slurs her speech and can't talk since she had the work on her teeth. Lots of stress and depression may be contributing, there is stress in her life, she panics a lot. Mother without dementia, she  is 37.No dementia in the family. She endorses compliance with CPAP.  Reviewed outside 59 documents from [facility]: 65 year old with many years of resistant depression and forgetfulness out of proportion. Mom passed away in 2015-12-25, could not focus even before mom passed away, forgetful, unable to remember work life and really struggles to focus, hard time organizing. Appears her brother is also in hospice after motorcycle accident, her nephew was murdered a year ago. Notes also show that she was given Adderall.  Notes from [facility] show that she follows with endocrinology for her type 2 diabetes date of diagnosis 1993 treated with insulin and metformin fairly compliant with her regimen with near-normal A1c results.   She had carpal tunnel surgery in February 2016 the right hand. Appears she also had a lap band in 12/24/05 and fluid removal due to obstruction in 2014. She was admitted to [facility] in August 2014 for TIA versus adverse effects of Wellbutrin after slurred speech and left arm weakness, lip smacking, in the setting of stress at home. Neurology was consulted, MRI of the brain was negative for acute stroke, patient admitted to be an under significant stress and was placed on Wellbutrin a week ago.  MRI of the brain August 2014, personally reviewed images and agree with the following: Comparison: Head CT same day   Findings: Diffusion imaging does not show any acute or subacute infarction.  The the brainstem and cerebellum are normal.  The cerebral hemispheres show scattered foci of T2 and FLAIR signal within the deep and subcortical white matter consistent with mild chronic small vessel disease.  No cortical or large vessel territory infarction.  No mass lesion, hemorrhage, hydrocephalus or extra-axial collection.  The sinuses are clear.  No pituitary mass. No skull or skull base lesion.   IMPRESSION: No acute abnormality.  Mild chronic small vessel disease of  the cerebral hemispheric white matter.  Review of Systems: Patient complains of symptoms were extensive, per HPI as well as the following symptoms: Fatigue, blurred vision, joint pain, swelling in the legs, joint pain, joint swelling, constipation, aching muscles, , itching, allergies, runny nose, skin sensitivity, memory loss, confusion, headache, numbness, insomnia, weakness, slurred speech, difficulty swallowing, dizziness, sleepiness,  restless legs, depression, anxiety, decreased energy, change in appetite, disinterest in activities, racing thoughts . Pertinent negatives per HPI. All others negative   Social History   Socioeconomic History   Marital status: Married    Spouse name: james   Number of children: 2   Years of education: 12   Highest education level: High school graduate  Occupational History   Occupation: Optometrist: [employer]  Tobacco Use   Smoking status: Never   Smokeless tobacco: Never  Vaping Use   Vaping Use: Never used  Substance and Sexual Activity   Alcohol use: No   Drug use: No   Sexual activity: Not Currently  Other Topics Concern   Not on file  Social History Narrative   Married with one son and one daughter   Caffeine: 2 cups a day of tea   Lives at home with her husband  Social Determinants of Radio broadcast assistant Strain: Not on file  Food Insecurity: Not on file  Transportation Needs: Not on file  Physical Activity: Not on file  Stress: Not on file  Social Connections: Not on file  Intimate Partner Violence: Not on file    Family History  Problem Relation Age of Onset   Heart failure Mother    Heart disease Father    Heart attack Father    Breast cancer Sister    Diabetes Sister    Sarcoidosis Brother    Prostate cancer Brother    Colon cancer Neg Hx    Dementia Neg Hx    Esophageal cancer Neg Hx    Rectal cancer Neg Hx    Stomach cancer Neg Hx    Memory loss Neg Hx     Past Medical History:   Diagnosis Date   ADHD (attention deficit hyperactivity disorder)    Anemia    Anxiety    Bipolar disorder ([place])    Carpal tunnel syndrome of right wrist 09/2014   Cataract, immature    Depression    Diabetic neuropathy ([place])    feet   Dyslipidemia    GERD (gastroesophageal reflux disease)    Gout    Hammer toe    Hyperlipidemia    Hypertension    states under control with med., has been on med. x 20 yrs.   IDA (iron deficiency anemia)    Insulin dependent diabetes mellitus    Major depressive disorder    Osteoarthritis    knees   Seborrheic dermatitis of scalp    SLEEP APNEA    uses CPAP nightly   Vitamin D deficiency     Past Surgical History:  Procedure Laterality Date   BREAST REDUCTION SURGERY Bilateral    CARPAL TUNNEL RELEASE Right 10/08/2014   Procedure: RIGHT CARPAL TUNNEL RELEASE;  Surgeon: Daryll Brod, MD;  Location: [facility];  Service: Orthopedics;  Laterality: Right;   COLONOSCOPY WITH PROPOFOL  10/27/2013   ESOPHAGOGASTRODUODENOSCOPY  02/03/2004   LAPAROSCOPIC GASTRIC BANDING  12/05/2005    Current Outpatient Medications  Medication Sig Dispense Refill   Acetaminophen (TYLENOL PO) Take 500 mg by mouth as needed.     aspirin 81 MG tablet Take 81 mg by mouth daily.     cetirizine (ZYRTEC) 10 MG tablet Take 10 mg by mouth daily.     Continuous Blood Gluc Sensor (FREESTYLE LIBRE SENSOR SYSTEM) MISC USE 1 SENSOR EVERY 10 DAYS  5   Dulaglutide (TRULICITY) 1.5 WP/8.0DX SOPN Trulicity 1.5 IP/3.8 mL subcutaneous pen injector     ergocalciferol (VITAMIN D2) 50000 UNITS capsule Take 50,000 Units by mouth every Monday.     FERREX 150 150 MG capsule Take by mouth daily.  1   FLUOCINOLONE ACETONIDE SCALP 0.01 % OIL Apply 1 application topically 2 (two) times a week.      furosemide (LASIX) 40 MG tablet Take 1 tablet by mouth daily.     gabapentin (NEURONTIN) 600 MG tablet Take 600 mg by mouth at bedtime.     hydroquinone 4 % cream APPLY TO AFFECTED AREA  ON FACE EVERY NIGHT     Insulin Glargine (LANTUS SOLOSTAR) 100 UNIT/ML Solostar Pen Inject 26 Units into the skin at bedtime. (Patient taking differently: Inject 36 Units into the skin at bedtime.) 15 pen 1   Insulin Pen Needle (FIFTY50 PEN NEEDLES) 31G X 5 MM MISC BD Ultra-Fine Mini Pen Needle 31 gauge x 3/16"  KLOR-CON M20 20 MEQ tablet Take 40 mEq by mouth daily.      L-Methylfolate-Algae (L-METHYLFOLATE FORTE) 15-90.314 MG CAPS Take 1 tablet by mouth at bedtime.     metFORMIN (GLUCOPHAGE-XR) 500 MG 24 hr tablet Take 1,000 mg by mouth 2 (two) times daily.     NOVOLOG FLEXPEN 100 UNIT/ML FlexPen INJECT 3 TO 12 UNITS SUBCUTANEOUSLY 3 TIMES A DAY     omeprazole (PRILOSEC) 40 MG capsule TAKE 1 CAPSULE BY MOUTH TWICE A DAY 180 capsule 1   telmisartan (MICARDIS) 80 MG tablet Take 80 mg by mouth daily.     traZODone (DESYREL) 50 MG tablet 4 tablets at night     triamcinolone (NASACORT) 55 MCG/ACT AERO nasal inhaler Place 2 sprays into the nose daily.     triamcinolone cream (KENALOG) 0.1 % triamcinolone acetonide 0.1 % topical cream     TRINTELLIX 20 MG TABS tablet Take 20 mg by mouth daily.  3   zaleplon (SONATA) 10 MG capsule Take 20 mg by mouth at bedtime.     No current facility-administered medications for this visit.   Facility-Administered Medications Ordered in Other Visits  Medication Dose Route Frequency Provider Last Rate Last Admin   gadopentetate dimeglumine (MAGNEVIST) injection 14 mL  14 mL Intravenous Once PRN Melvenia Beam, MD        Allergies as of 06/13/2021   (No Known Allergies)    Vitals: BP 131/80   Pulse 69   Ht 5\' 4"  (1.626 m)   Wt 186 lb 9.6 oz (84.6 kg)   BMI 32.03 kg/m  Last Weight:  Wt Readings from Last 1 Encounters:  06/13/21 186 lb 9.6 oz (84.6 kg)   Last Height:   Ht Readings from Last 1 Encounters:  06/13/21 5\' 4"  (1.626 m)   Exam: NAD, pleasant                  Speech:    Speech is normal; fluent and spontaneous with normal  comprehension.  Cognition:    The patient is oriented to person, place, and time;     recent and remote memory intact;     language fluent;    Cranial Nerves: MMSE - Mini Mental State Exam 06/13/2021 02/26/2019 01/28/2016  Orientation to time 5 5 5   Orientation to Place 5 5 5   Registration 3 3 3   Attention/ Calculation 5 3 3   Recall 3 2 1   Language- name 2 objects 2 2 2   Language- repeat 0 0 0  Language- follow 3 step command 3 2 3   Language- read & follow direction 1 1 1   Write a sentence 1 0 1  Copy design 0 0 0  Total score 28 23 24   Some encounter information is confidential and restricted. Go to Review Flowsheets activity to see all data.       The pupils are equal, round, and reactive to light.Trigeminal sensation is intact and the muscles of mastication are normal. The face is symmetric. The palate elevates in the midline. Hearing intact. Voice is normal. Shoulder shrug is normal. The tongue has normal motion without fasciculations.   Coordination:  No dysmetria  Motor Observation:    No asymmetry, no atrophy, and no involuntary movements noted. Tone:    Normal muscle tone.     Strength:    Strength is V/V in the upper and lower limbs.      Sensation: intact to LT    Assessment/Plan:   65y.o. female from Dr.  White and Dr. Toy Care for memory problems. PMHx resistant depression, HLD, bipolar, diabetic neuropathy, anxiety, htn, sleep apnea, diabetes here for memory complaints. Notes from psychiatry state that she has resistant depression, difficulty with concentration, forgetfulness.Initially  seen over 5 years ago for memory problems and also in 2020 and now she is back again.  Patient has a history of significant resistant depression, bipolar disorder, anxiety, sleep apnea, diabetes, hypertension.  At the time of last evaluation the symptoms were thought to be due to her significant mood disorders.  MRI of the brain was completed and lab work.  She was referred back again to  neurology by Dr. Toy Care 2020 who is her psychiatrist for the same issues.   Likely this is due to medical conditions, anxiety, depression, bipolar, sedentary lifestyle and other multifactorial as opposed to dementia or  primary neurodegenrative issue. Her MMSE was 28/30 which is improved from prior years(last in 2020). Not consistent with a neurodegenerative diorder(Ie alzheimer's or other). Please read this note prior to referring her again back to neurology. Also, she did not complete the formal neurocognitive testing we ordered last time. Return to psychiatry and pcp.   - MRI of the brain in June 2020: Personally reviewed images and was unremarkable, some mild small vessel ischemic changes not unusual for age otherwise normal.  No changes from 2017 - EEG to evaluate for epileptiform activity: Was normal February 23, 2016 - formal neurocognitive testing : SHE DID NOT COMPLETE THIS TEST. She states she didn't and I don't see it anywhere in the chart.   -Diagnosed with sleep apnea in the past:. Patient says she followed up and she does not have sleep apnea  CC: Dr. Dema Severin and Dr. Toy Care   Cc: Harlan Stains, MD, Dr. Chucky May  Sarina Ill, MD  [facility] [address] [place], [place] [address]  Phone [phone] Fax [phone]  I spent 20 minutes of face-to-face and non-face-to-face time with patient on the  1. Mood disorder ([place])   2. Subjective memory complaints    diagnosis.  This included previsit chart review, lab review, study review, order entry, electronic health record documentation, patient education on the different diagnostic and therapeutic options, counseling and coordination of care, risks and benefits of management, compliance, or risk factor reduction

## 2021-06-17 DIAGNOSIS — E7800 Pure hypercholesterolemia, unspecified: Secondary | ICD-10-CM | POA: Diagnosis not present

## 2021-06-17 DIAGNOSIS — E1165 Type 2 diabetes mellitus with hyperglycemia: Secondary | ICD-10-CM | POA: Diagnosis not present

## 2021-06-21 ENCOUNTER — Emergency Department (HOSPITAL_COMMUNITY): Payer: Medicare Other

## 2021-06-21 ENCOUNTER — Other Ambulatory Visit: Payer: Self-pay

## 2021-06-21 ENCOUNTER — Encounter (HOSPITAL_COMMUNITY): Payer: Self-pay

## 2021-06-21 ENCOUNTER — Emergency Department (HOSPITAL_COMMUNITY)
Admission: EM | Admit: 2021-06-21 | Discharge: 2021-06-21 | Disposition: A | Discharge status: Home / Self Care | Payer: Medicare Other | Attending: Student | Admitting: Student

## 2021-06-21 DIAGNOSIS — Z87891 Personal history of nicotine dependence: Secondary | ICD-10-CM | POA: Diagnosis not present

## 2021-06-21 DIAGNOSIS — Z7982 Long term (current) use of aspirin: Secondary | ICD-10-CM | POA: Diagnosis not present

## 2021-06-21 DIAGNOSIS — K5939 Other megacolon: Secondary | ICD-10-CM | POA: Diagnosis not present

## 2021-06-21 DIAGNOSIS — R1084 Generalized abdominal pain: Secondary | ICD-10-CM | POA: Diagnosis not present

## 2021-06-21 DIAGNOSIS — E1140 Type 2 diabetes mellitus with diabetic neuropathy, unspecified: Secondary | ICD-10-CM | POA: Diagnosis not present

## 2021-06-21 DIAGNOSIS — Z794 Long term (current) use of insulin: Secondary | ICD-10-CM | POA: Insufficient documentation

## 2021-06-21 DIAGNOSIS — K56609 Unspecified intestinal obstruction, unspecified as to partial versus complete obstruction: Secondary | ICD-10-CM | POA: Diagnosis not present

## 2021-06-21 DIAGNOSIS — Z7984 Long term (current) use of oral hypoglycemic drugs: Secondary | ICD-10-CM | POA: Insufficient documentation

## 2021-06-21 DIAGNOSIS — I10 Essential (primary) hypertension: Secondary | ICD-10-CM | POA: Insufficient documentation

## 2021-06-21 DIAGNOSIS — K6389 Other specified diseases of intestine: Secondary | ICD-10-CM | POA: Diagnosis not present

## 2021-06-21 DIAGNOSIS — N281 Cyst of kidney, acquired: Secondary | ICD-10-CM | POA: Diagnosis not present

## 2021-06-21 DIAGNOSIS — K820 Obstruction of gallbladder: Secondary | ICD-10-CM | POA: Diagnosis not present

## 2021-06-21 LAB — URINALYSIS, ROUTINE W REFLEX MICROSCOPIC
Bacteria, UA: NONE SEEN
Bilirubin Urine: NEGATIVE
Glucose, UA: >=500 mg/dL — AB
Hgb urine dipstick: NEGATIVE
Ketones, ur: 5 mg/dL — AB
Leukocytes,Ua: NEGATIVE
Nitrite: NEGATIVE
Protein, ur: NEGATIVE mg/dL
Specific Gravity, Urine: 1.015 (ref 1.005–1.030)
pH: 7 (ref 5.0–8.0)

## 2021-06-21 LAB — COMPREHENSIVE METABOLIC PANEL
ALT: 76 U/L — ABNORMAL HIGH (ref 0–44)
AST: 38 U/L (ref 15–41)
Albumin: 3.9 g/dL (ref 3.5–5.0)
Alkaline Phosphatase: 108 U/L (ref 38–126)
Anion gap: 11 (ref 5–15)
BUN: 10 mg/dL (ref 8–23)
CO2: 23 mmol/L (ref 22–32)
Calcium: 9.4 mg/dL (ref 8.9–10.3)
Chloride: 101 mmol/L (ref 98–111)
Creatinine, Ser: 1.07 mg/dL — ABNORMAL HIGH (ref 0.44–1.00)
GFR, Estimated: 58 mL/min — ABNORMAL LOW (ref >60)
Glucose, Bld: 329 mg/dL — ABNORMAL HIGH (ref 70–99)
Potassium: 4.9 mmol/L (ref 3.5–5.1)
Sodium: 135 mmol/L (ref 135–145)
Total Bilirubin: 0.5 mg/dL (ref 0.3–1.2)
Total Protein: 6.6 g/dL (ref 6.5–8.1)

## 2021-06-21 LAB — CBC WITH DIFFERENTIAL/PLATELET
Abs Immature Granulocytes: 0.06 10*3/uL (ref 0.00–0.07)
Basophils Absolute: 0 10*3/uL (ref 0.0–0.1)
Basophils Relative: 0 %
Eosinophils Absolute: 0 10*3/uL (ref 0.0–0.5)
Eosinophils Relative: 0 %
HCT: 42.1 % (ref 36.0–46.0)
Hemoglobin: 12.9 g/dL (ref 12.0–15.0)
Immature Granulocytes: 1 %
Lymphocytes Relative: 4 %
Lymphs Abs: 0.5 10*3/uL — ABNORMAL LOW (ref 0.7–4.0)
MCH: 29.7 pg (ref 26.0–34.0)
MCHC: 30.6 g/dL (ref 30.0–36.0)
MCV: 97 fL (ref 80.0–100.0)
Monocytes Absolute: 0.3 10*3/uL (ref 0.1–1.0)
Monocytes Relative: 3 %
Neutro Abs: 10.2 10*3/uL — ABNORMAL HIGH (ref 1.7–7.7)
Neutrophils Relative %: 92 %
Platelets: 197 10*3/uL (ref 150–400)
RBC: 4.34 MIL/uL (ref 3.87–5.11)
RDW: 12.7 % (ref 11.5–15.5)
WBC: 11.1 10*3/uL — ABNORMAL HIGH (ref 4.0–10.5)
nRBC: 0 % (ref 0.0–0.2)

## 2021-06-21 LAB — LIPASE, BLOOD: Lipase: 28 U/L (ref 11–51)

## 2021-06-21 LAB — CBG MONITORING, ED: Glucose-Capillary: 131 mg/dL — ABNORMAL HIGH (ref 70–99)

## 2021-06-21 MED ORDER — SODIUM CHLORIDE 0.9 % IV BOLUS
1000.0000 mL | Freq: Once | INTRAVENOUS | Status: AC
  Administered 2021-06-21: 1000 mL via INTRAVENOUS

## 2021-06-21 MED ORDER — MORPHINE SULFATE (PF) 4 MG/ML IV SOLN
4.0000 mg | Freq: Once | INTRAVENOUS | Status: AC
  Administered 2021-06-21: 4 mg via INTRAVENOUS
  Filled 2021-06-21: qty 1

## 2021-06-21 MED ORDER — ONDANSETRON HCL 4 MG/2ML IJ SOLN
4.0000 mg | Freq: Once | INTRAMUSCULAR | Status: AC
  Administered 2021-06-21: 4 mg via INTRAVENOUS
  Filled 2021-06-21: qty 2

## 2021-06-21 MED ORDER — IOHEXOL 350 MG/ML SOLN
65.0000 mL | Freq: Once | INTRAVENOUS | Status: AC | PRN
  Administered 2021-06-21: 65 mL via INTRAVENOUS

## 2021-06-21 NOTE — ED Provider Notes (Signed)
Emergency Medicine Provider Triage Evaluation Note  Amy [name] , a 65 y.o. female  was evaluated in triage.  Pt complains of abdominal pain with subjective fever.  States she feels bloated.  She did have a bowel movement about 30 minutes prior to arrival in ED.  She has not had any nausea or vomiting.  Hx lap band 15 years ago  Review of Systems  Positive: Abdominal pain Negative: vomiting  Physical Exam  BP 136/73   Pulse 73   Temp 98.9 F (37.2 C)   Resp 17   Ht 5\' 4"  (1.626 m)   Wt 83 kg   SpO2 92%   BMI 31.41 kg/m  Gen:   Awake, no distress   Resp:  Normal effort  MSK:   Moves extremities without difficulty  Other:  Abdomen soft but appears somewhat distended  Medical Decision Making  Medically screening exam initiated at 1:54 AM.  Appropriate orders placed.  DOSHA BROSHEARS was informed that the remainder of the evaluation will be completed by another provider, this initial triage assessment does not replace that evaluation, and the importance of remaining in the ED until their evaluation is complete.  Abdominal pain, hx lap band 15 years ago.  Seems distended on exam.  Labs, UA, CT.   Larene Pickett, PA-C 06/21/21 [place], Kennard, DO 06/21/21 [phone]

## 2021-06-21 NOTE — ED Provider Notes (Signed)
[facility] EMERGENCY DEPARTMENT Provider Note   CSN: [id number] Arrival date & time: 06/21/21  0002     History Chief Complaint  Patient presents with   Abdominal Pain    Amy [name] is a 65 y.o. female with a past medical history of diabetes, hyperlipidemia and obesity who presented to the emergency department early this morning with a complaint of abdominal pain.  Patient reports that the pain started suddenly yesterday afternoon.  At the same time she began diaphoretic and felt hot.  10-15 years ago she had a gastric band placed and she feels as though it is out of place.  She can usually physically feel this and was unable to feel it when this pain began yesterday.  She denies nausea or vomiting.  Last bowel movement was earlier today without abnormalities.  Patient still passing gas.  Has never had other surgeries on her stomach.  She says her blood sugars have been running normally at home and she has not had urinary symptoms.  Past Medical History:  Diagnosis Date   ADHD (attention deficit hyperactivity disorder)    Anemia    Anxiety    Bipolar disorder ([place])    Carpal tunnel syndrome of right wrist 09/2014   Cataract, immature    Depression    Diabetic neuropathy (HCC)    feet   Dyslipidemia    GERD (gastroesophageal reflux disease)    Gout    Hammer toe    Hyperlipidemia    Hypertension    states under control with med., has been on med. x 20 yrs.   IDA (iron deficiency anemia)    Insulin dependent diabetes mellitus    Major depressive disorder    Osteoarthritis    knees   Seborrheic dermatitis of scalp    SLEEP APNEA    uses CPAP nightly   Vitamin D deficiency     Patient Active Problem List   Diagnosis Date Noted   Diabetes mellitus ([place]) 11/23/2017   Postmenopausal bleeding 11/23/2017   Fitting and adjustment of gastric lap band 09/05/2013   Other hammer toe (acquired) 06/03/2013   Other and unspecified hyperlipidemia 04/28/2013    TIA (transient ischemic attack) 04/09/2013   Lapband Vagotomy Study Pt May 2007 with 10 cm lapband 08/04/2011   ESOPHAGEAL REFLUX 10/10/2010   HEMATEMESIS 10/10/2010   DYSPHAGIA UNSPECIFIED 10/10/2010   EDEMA 12/20/2009   OBESITY, MORBID 01/18/2009   SLEEP APNEA 01/18/2009   NUMBNESS 04/20/2008   Type II or unspecified type diabetes mellitus with neurological manifestations, uncontrolled(250.62) 03/14/2007   DEPRESSION 03/14/2007   HYPERTENSION 03/14/2007   ALLERGIC RHINITIS 03/14/2007    Past Surgical History:  Procedure Laterality Date   BREAST REDUCTION SURGERY Bilateral    CARPAL TUNNEL RELEASE Right 10/08/2014   Procedure: RIGHT CARPAL TUNNEL RELEASE;  Surgeon: Daryll Brod, MD;  Location: [facility];  Service: Orthopedics;  Laterality: Right;   COLONOSCOPY WITH PROPOFOL  10/27/2013   ESOPHAGOGASTRODUODENOSCOPY  02/03/2004   LAPAROSCOPIC GASTRIC BANDING  12/05/2005     OB History   No obstetric history on file.     Family History  Problem Relation Age of Onset   Heart failure Mother    Heart disease Father    Heart attack Father    Breast cancer Sister    Diabetes Sister    Sarcoidosis Brother    Prostate cancer Brother    Colon cancer Neg Hx    Dementia Neg Hx    Esophageal  cancer Neg Hx    Rectal cancer Neg Hx    Stomach cancer Neg Hx    Memory loss Neg Hx     Social History   Tobacco Use   Smoking status: Never   Smokeless tobacco: Never  Vaping Use   Vaping Use: Never used  Substance Use Topics   Alcohol use: No   Drug use: No    Home Medications Prior to Admission medications   Medication Sig Start Date End Date Taking? Authorizing Provider  Acetaminophen (TYLENOL PO) Take 500 mg by mouth as needed.    [provider]  aspirin 81 MG tablet Take 81 mg by mouth daily.    [provider]  cetirizine (ZYRTEC) 10 MG tablet Take 10 mg by mouth daily.    [provider]  Continuous Blood Gluc Sensor (FREESTYLE  LIBRE SENSOR SYSTEM) MISC USE 1 SENSOR EVERY 10 DAYS 11/08/17   [provider]  Dulaglutide (TRULICITY) 1.5 CW/2.3JS SOPN Trulicity 1.5 EG/3.1 mL subcutaneous pen injector    [provider]  ergocalciferol (VITAMIN D2) 50000 UNITS capsule Take 50,000 Units by mouth every Monday.    [provider]  FERREX 150 150 MG capsule Take by mouth daily. 10/19/17   [provider]  FLUOCINOLONE ACETONIDE SCALP 0.01 % OIL Apply 1 application topically 2 (two) times a week.  08/29/13   [provider]  furosemide (LASIX) 40 MG tablet Take 1 tablet by mouth daily.    [provider]  gabapentin (NEURONTIN) 600 MG tablet Take 600 mg by mouth at bedtime.    [provider]  hydroquinone 4 % cream APPLY TO AFFECTED AREA ON FACE EVERY NIGHT 03/09/19   [provider]  Insulin Glargine (LANTUS SOLOSTAR) 100 UNIT/ML Solostar Pen Inject 26 Units into the skin at bedtime. Patient taking differently: Inject 36 Units into the skin at bedtime. 11/20/16   Elayne Snare, MD  Insulin Pen Needle (FIFTY50 PEN NEEDLES) 31G X 5 MM MISC BD Ultra-Fine Mini Pen Needle 31 gauge x 3/16"    [provider]  KLOR-CON M20 20 MEQ tablet Take 40 mEq by mouth daily.  08/29/13   [provider]  L-Methylfolate-Algae (L-METHYLFOLATE FORTE) 15-90.314 MG CAPS Take 1 tablet by mouth at bedtime.    [provider]  metFORMIN (GLUCOPHAGE-XR) 500 MG 24 hr tablet Take 1,000 mg by mouth 2 (two) times daily. 06/05/19   [provider]  NOVOLOG FLEXPEN 100 UNIT/ML FlexPen INJECT 3 TO 12 UNITS SUBCUTANEOUSLY 3 TIMES A DAY 03/10/19   [provider]  omeprazole (PRILOSEC) 40 MG capsule TAKE 1 CAPSULE BY MOUTH TWICE A DAY 03/28/21   Nelida Meuse III, MD  telmisartan (MICARDIS) 80 MG tablet Take 80 mg by mouth daily.    [provider]  traZODone (DESYREL) 50 MG tablet 4 tablets at night 12/04/18   [provider]  triamcinolone  (NASACORT) 55 MCG/ACT AERO nasal inhaler Place 2 sprays into the nose daily.    [provider]  triamcinolone cream (KENALOG) 0.1 % triamcinolone acetonide 0.1 % topical cream    [provider]  TRINTELLIX 20 MG TABS tablet Take 20 mg by mouth daily. 10/27/17   [provider]  zaleplon (SONATA) 10 MG capsule Take 20 mg by mouth at bedtime. 06/11/19   [provider]  imipramine (TOFRANIL-PM) 150 MG capsule Take 4 by mouth at bedtime   11/10/11  [provider]    Allergies  Patient has no known allergies.  Review of Systems   Review of Systems  Constitutional:  Positive for fever.  Gastrointestinal:  Positive for abdominal pain. Negative for blood in stool, constipation, diarrhea, nausea and vomiting.  Genitourinary:  Negative for dysuria and hematuria.  All other systems reviewed and are negative.  Physical Exam Updated Vital Signs BP 110/70 (BP Location: Right Arm)   Pulse 78   Temp 98.8 F (37.1 C) (Oral)   Resp 15   Ht 5\' 4"  (1.626 m)   Wt 83 kg   SpO2 99%   BMI 31.41 kg/m   Physical Exam Vitals and nursing note reviewed.  Constitutional:      Appearance: Normal appearance.  HENT:     Head: Normocephalic and atraumatic.  Eyes:     General: No scleral icterus.    Conjunctiva/sclera: Conjunctivae normal.  Pulmonary:     Effort: Pulmonary effort is normal. No respiratory distress.  Abdominal:     General: Abdomen is flat. A surgical scar is present. There is no distension.     Palpations: Abdomen is soft.     Tenderness: There is generalized abdominal tenderness.     Hernia: No hernia is present.  Skin:    General: Skin is warm and dry.     Findings: No rash.  Neurological:     Mental Status: She is alert.  Psychiatric:        Mood and Affect: Mood normal.    ED Results / Procedures / Treatments   Labs (all labs ordered are listed, but only abnormal results are displayed) Labs Reviewed  COMPREHENSIVE METABOLIC  PANEL - Abnormal; Notable for the following components:      Result Value   Glucose, Bld 329 (*)    Creatinine, Ser 1.07 (*)    ALT 76 (*)    GFR, Estimated 58 (*)    All other components within normal limits  URINALYSIS, ROUTINE W REFLEX MICROSCOPIC - Abnormal; Notable for the following components:   Color, Urine STRAW (*)    Glucose, UA >=500 (*)    Ketones, ur 5 (*)    All other components within normal limits  CBC WITH DIFFERENTIAL/PLATELET - Abnormal; Notable for the following components:   WBC 11.1 (*)    Neutro Abs 10.2 (*)    Lymphs Abs 0.5 (*)    All other components within normal limits  CBG MONITORING, ED - Abnormal; Notable for the following components:   Glucose-Capillary 131 (*)    All other components within normal limits  LIPASE, BLOOD    EKG None  Radiology DG Abdomen 1 View  Result Date: 06/21/2021 CLINICAL DATA:  Small-bowel obstruction follow-up EXAM: ABDOMEN - 1 VIEW COMPARISON:  CT abdomen and pelvis earlier the same day, abdominal x-ray 05/24/2016 FINDINGS: No abnormally distended gas-filled loops of bowel identified to suggest obstruction. Moderate amount of retained gas and fecal material in the colon. Gastric lap band in place. No suspicious calcifications visualized. IMPRESSION: Nonobstructive bowel gas pattern. Electronically Signed   By: Ofilia Neas   On: 06/21/2021 13:26   CT ABDOMEN PELVIS W CONTRAST  Result Date: 06/21/2021 CLINICAL DATA:  Abdominal distension and pain. EXAM: CT ABDOMEN AND PELVIS WITH CONTRAST TECHNIQUE: Multidetector CT imaging of the abdomen and pelvis was performed using the standard protocol following bolus administration of intravenous contrast. CONTRAST:  89mL OMNIPAQUE IOHEXOL 350 MG/ML SOLN COMPARISON:  CT abdomen pelvis dated 06/18/2020 FINDINGS: Lower chest: The visualized lung bases are clear. No intra-abdominal free  air or free fluid. Hepatobiliary: Mild fatty liver. No intrahepatic biliary dilatation. The  gallbladder is contracted. No calcified gallstone. Pancreas: Unremarkable. No pancreatic ductal dilatation or surrounding inflammatory changes. Spleen: Normal in size without focal abnormality. Adrenals/Urinary Tract: The adrenal glands unremarkable. There is no hydronephrosis on either side. Streak enhancement and excretion of contrast by both kidneys. There is a 2.5 cm left renal interpolar cyst and additional subcentimeter hypodensity which is too small to characterize. There is symmetric enhancement and excretion of contrast by both kidneys. The visualized ureters and urinary bladder appear unremarkable. Stomach/Bowel: A gastric lap band noted. There is mild dilatation of several small bowel loops measuring up to 3.5 cm. Findings may represent segmental ileus although developing obstruction secondary to adhesion is not excluded. Clinical correlation recommended. Follow-up with bowel series may provide better evaluation. The colon is unremarkable. The appendix is normal. Vascular/Lymphatic: Mild atherosclerotic calcification of the abdominal aorta. The IVC is unremarkable. No portal venous gas. There is no adenopathy. Reproductive: The uterus and ovaries are grossly unremarkable. Other: None Musculoskeletal: Grade 1 L4-L5 anterolisthesis with disc desiccation. No acute osseous pathology. IMPRESSION: 1. Mildly dilated small bowel loops may represent segmental ileus although developing obstruction secondary to adhesion is not excluded. Follow-up with bowel series may provide better evaluation. 2. Mild fatty liver. 3. Aortic Atherosclerosis (ICD10-I70.0). Electronically Signed   By: Anner Crete M.D.   On: 06/21/2021 03:33    Procedures Procedures   Medications Ordered in ED Medications  iohexol (OMNIPAQUE) 350 MG/ML injection 65 mL (65 mLs Intravenous Contrast Given 06/21/21 0314)    ED Course  I have reviewed the triage vital signs and the nursing notes.  Pertinent labs & imaging results that were  available during my care of the patient were reviewed by me and considered in my medical decision making (see chart for details).    MDM Rules/Calculators/A&P The differential diagnosis for generalized abdominal pain includes, but is not limited to AAA, gastroenteritis, appendicitis, Bowel obstruction, Bowel perforation. Gastroparesis, DKA, Hernia, Inflammatory bowel disease, mesenteric ischemia, pancreatitis, peritonitis SBP, volvulus.  All of these were considered throughout the evaluation of the patient.  Rhyann Berton is a 65 year old female with a past medical history of obesity requiring gastric banding who presented today with a complaint of sudden onset abdominal pain.  She felt as though her gastric band was out of place at that time, however feels as though it resolved.  Initially a CT abdomen was ordered from the waiting room which suggested a potential ileus or obstruction.  Radiologist recommended abdominal series.  Plain film revealed no evidence of obstruction.  Patient reported that she had a recent bowel movement and still is passing gas.  No longer feels as though her band is out of place.  She is eating in bed without difficulty.  Has not vomited.  Says her pain has resolved with the morphine and Zofran.  At this time I feel the patient is safe to follow-up with Dr. Hassell Done who follows her after her surgery 10 to 15 years ago.  She already has an appointment with him in November.  She is understanding and agreeable to this plan.  Ambulatory in the department and stable for discharge.  Final Clinical Impression(s) / ED Diagnoses Final diagnoses:  Generalized abdominal pain    Rx / DC Orders Results and diagnoses were explained to the patient. Return precautions discussed in full. Patient had no additional questions and expressed complete understanding.     Jahseh Lucchese A, PA-C  06/21/21 [place], MD 06/21/21 1536

## 2021-06-21 NOTE — ED Notes (Signed)
LBM 30 mins prior to arrival and reports it was normal

## 2021-06-21 NOTE — ED Notes (Signed)
Pt provided with apple juice and crackers

## 2021-06-21 NOTE — Discharge Instructions (Signed)
Your imaging does not reveal any bowel obstruction.  I am unsure what caused your severe abdominal pain, however I believe you need to follow-up with Dr. Hassell Done.  You should also follow-up with your primary care provider about your symptoms if they continue.  Please read the attached information about abdominal pain and return as stated.  It was a pleasure to meet you today and I hope that you feel better.

## 2021-06-21 NOTE — ED Notes (Signed)
Patient Alert and oriented to baseline. Stable and ambulatory to baseline. Patient verbalized understanding of the discharge instructions.  Patient belongings were taken by the patient.   

## 2021-06-21 NOTE — ED Triage Notes (Signed)
Pt reports right sided constant abd pain onset at 6pm (06/20/21) associated with subjective fever "I think I had a temperature, I was sweating." She denies n/v/d or pain anywhere else. Abdomen does appear distended and hard.

## 2021-06-22 DIAGNOSIS — E785 Hyperlipidemia, unspecified: Secondary | ICD-10-CM | POA: Diagnosis not present

## 2021-06-22 DIAGNOSIS — Z9884 Bariatric surgery status: Secondary | ICD-10-CM | POA: Diagnosis not present

## 2021-06-22 DIAGNOSIS — R7401 Elevation of levels of liver transaminase levels: Secondary | ICD-10-CM | POA: Diagnosis not present

## 2021-06-22 DIAGNOSIS — E1140 Type 2 diabetes mellitus with diabetic neuropathy, unspecified: Secondary | ICD-10-CM | POA: Diagnosis not present

## 2021-06-22 DIAGNOSIS — R1011 Right upper quadrant pain: Secondary | ICD-10-CM | POA: Diagnosis not present

## 2021-06-22 DIAGNOSIS — I10 Essential (primary) hypertension: Secondary | ICD-10-CM | POA: Diagnosis not present

## 2021-06-29 DIAGNOSIS — Z23 Encounter for immunization: Secondary | ICD-10-CM | POA: Diagnosis not present

## 2021-06-30 DIAGNOSIS — I10 Essential (primary) hypertension: Secondary | ICD-10-CM | POA: Diagnosis not present

## 2021-06-30 DIAGNOSIS — E7800 Pure hypercholesterolemia, unspecified: Secondary | ICD-10-CM | POA: Diagnosis not present

## 2021-06-30 DIAGNOSIS — E1165 Type 2 diabetes mellitus with hyperglycemia: Secondary | ICD-10-CM | POA: Diagnosis not present

## 2021-06-30 DIAGNOSIS — G629 Polyneuropathy, unspecified: Secondary | ICD-10-CM | POA: Diagnosis not present

## 2021-07-08 DIAGNOSIS — F332 Major depressive disorder, recurrent severe without psychotic features: Secondary | ICD-10-CM | POA: Diagnosis not present

## 2021-07-13 DIAGNOSIS — Z4651 Encounter for fitting and adjustment of gastric lap band: Secondary | ICD-10-CM | POA: Diagnosis not present

## 2021-07-20 DIAGNOSIS — F332 Major depressive disorder, recurrent severe without psychotic features: Secondary | ICD-10-CM | POA: Diagnosis not present

## 2021-08-03 DIAGNOSIS — F332 Major depressive disorder, recurrent severe without psychotic features: Secondary | ICD-10-CM | POA: Diagnosis not present

## 2021-08-17 DIAGNOSIS — F332 Major depressive disorder, recurrent severe without psychotic features: Secondary | ICD-10-CM | POA: Diagnosis not present

## 2021-08-31 DIAGNOSIS — E785 Hyperlipidemia, unspecified: Secondary | ICD-10-CM | POA: Diagnosis not present

## 2021-08-31 DIAGNOSIS — I10 Essential (primary) hypertension: Secondary | ICD-10-CM | POA: Diagnosis not present

## 2021-08-31 DIAGNOSIS — I700 Atherosclerosis of aorta: Secondary | ICD-10-CM | POA: Diagnosis not present

## 2021-08-31 DIAGNOSIS — E1140 Type 2 diabetes mellitus with diabetic neuropathy, unspecified: Secondary | ICD-10-CM | POA: Diagnosis not present

## 2021-09-01 ENCOUNTER — Other Ambulatory Visit: Payer: Self-pay | Admitting: Family Medicine

## 2021-09-01 DIAGNOSIS — I6522 Occlusion and stenosis of left carotid artery: Secondary | ICD-10-CM

## 2021-09-01 DIAGNOSIS — F332 Major depressive disorder, recurrent severe without psychotic features: Secondary | ICD-10-CM | POA: Diagnosis not present

## 2021-09-08 ENCOUNTER — Ambulatory Visit
Admission: RE | Admit: 2021-09-08 | Discharge: 2021-09-08 | Disposition: A | Discharge status: Home / Self Care | Payer: Medicare Other | Source: Ambulatory Visit | Attending: Family Medicine | Admitting: Family Medicine

## 2021-09-08 DIAGNOSIS — I6522 Occlusion and stenosis of left carotid artery: Secondary | ICD-10-CM | POA: Diagnosis not present

## 2021-09-14 DIAGNOSIS — F332 Major depressive disorder, recurrent severe without psychotic features: Secondary | ICD-10-CM | POA: Diagnosis not present

## 2021-09-18 ENCOUNTER — Other Ambulatory Visit: Payer: Self-pay | Admitting: Gastroenterology

## 2021-09-29 ENCOUNTER — Other Ambulatory Visit: Payer: Self-pay | Admitting: Family Medicine

## 2021-09-29 DIAGNOSIS — Z1231 Encounter for screening mammogram for malignant neoplasm of breast: Secondary | ICD-10-CM

## 2021-10-03 DIAGNOSIS — F332 Major depressive disorder, recurrent severe without psychotic features: Secondary | ICD-10-CM | POA: Diagnosis not present

## 2021-10-18 ENCOUNTER — Ambulatory Visit
Admission: RE | Admit: 2021-10-18 | Discharge: 2021-10-18 | Disposition: A | Discharge status: Home / Self Care | Payer: Medicare Other | Source: Ambulatory Visit | Attending: Family Medicine | Admitting: Family Medicine

## 2021-10-18 ENCOUNTER — Other Ambulatory Visit: Payer: Self-pay

## 2021-10-18 DIAGNOSIS — Z1231 Encounter for screening mammogram for malignant neoplasm of breast: Secondary | ICD-10-CM

## 2021-10-19 DIAGNOSIS — F332 Major depressive disorder, recurrent severe without psychotic features: Secondary | ICD-10-CM | POA: Diagnosis not present

## 2021-10-31 DIAGNOSIS — F332 Major depressive disorder, recurrent severe without psychotic features: Secondary | ICD-10-CM | POA: Diagnosis not present

## 2021-11-09 DIAGNOSIS — Z9884 Bariatric surgery status: Secondary | ICD-10-CM | POA: Diagnosis not present

## 2021-11-09 DIAGNOSIS — Z4651 Encounter for fitting and adjustment of gastric lap band: Secondary | ICD-10-CM | POA: Diagnosis not present

## 2021-11-14 DIAGNOSIS — F332 Major depressive disorder, recurrent severe without psychotic features: Secondary | ICD-10-CM | POA: Diagnosis not present

## 2021-11-17 DIAGNOSIS — J310 Chronic rhinitis: Secondary | ICD-10-CM | POA: Diagnosis not present

## 2021-11-17 DIAGNOSIS — J343 Hypertrophy of nasal turbinates: Secondary | ICD-10-CM | POA: Diagnosis not present

## 2021-11-17 DIAGNOSIS — J342 Deviated nasal septum: Secondary | ICD-10-CM | POA: Diagnosis not present

## 2021-11-19 ENCOUNTER — Other Ambulatory Visit: Payer: Self-pay | Admitting: Gastroenterology

## 2021-11-30 DIAGNOSIS — E559 Vitamin D deficiency, unspecified: Secondary | ICD-10-CM | POA: Diagnosis not present

## 2021-11-30 DIAGNOSIS — E785 Hyperlipidemia, unspecified: Secondary | ICD-10-CM | POA: Diagnosis not present

## 2021-11-30 DIAGNOSIS — R7401 Elevation of levels of liver transaminase levels: Secondary | ICD-10-CM | POA: Diagnosis not present

## 2021-11-30 DIAGNOSIS — I700 Atherosclerosis of aorta: Secondary | ICD-10-CM | POA: Diagnosis not present

## 2021-11-30 DIAGNOSIS — R1012 Left upper quadrant pain: Secondary | ICD-10-CM | POA: Diagnosis not present

## 2021-11-30 DIAGNOSIS — F3175 Bipolar disorder, in partial remission, most recent episode depressed: Secondary | ICD-10-CM | POA: Diagnosis not present

## 2021-11-30 DIAGNOSIS — I10 Essential (primary) hypertension: Secondary | ICD-10-CM | POA: Diagnosis not present

## 2021-11-30 DIAGNOSIS — F332 Major depressive disorder, recurrent severe without psychotic features: Secondary | ICD-10-CM | POA: Diagnosis not present

## 2021-11-30 DIAGNOSIS — D509 Iron deficiency anemia, unspecified: Secondary | ICD-10-CM | POA: Diagnosis not present

## 2021-11-30 DIAGNOSIS — R3915 Urgency of urination: Secondary | ICD-10-CM | POA: Diagnosis not present

## 2021-12-14 DIAGNOSIS — F332 Major depressive disorder, recurrent severe without psychotic features: Secondary | ICD-10-CM | POA: Diagnosis not present

## 2021-12-28 DIAGNOSIS — E1165 Type 2 diabetes mellitus with hyperglycemia: Secondary | ICD-10-CM | POA: Diagnosis not present

## 2021-12-28 DIAGNOSIS — Z9884 Bariatric surgery status: Secondary | ICD-10-CM | POA: Diagnosis not present

## 2021-12-28 DIAGNOSIS — Z4651 Encounter for fitting and adjustment of gastric lap band: Secondary | ICD-10-CM | POA: Diagnosis not present

## 2021-12-28 DIAGNOSIS — I10 Essential (primary) hypertension: Secondary | ICD-10-CM | POA: Diagnosis not present

## 2021-12-28 DIAGNOSIS — E7800 Pure hypercholesterolemia, unspecified: Secondary | ICD-10-CM | POA: Diagnosis not present

## 2022-01-04 DIAGNOSIS — F332 Major depressive disorder, recurrent severe without psychotic features: Secondary | ICD-10-CM | POA: Diagnosis not present

## 2022-01-11 DIAGNOSIS — F332 Major depressive disorder, recurrent severe without psychotic features: Secondary | ICD-10-CM | POA: Diagnosis not present

## 2022-01-18 DIAGNOSIS — F332 Major depressive disorder, recurrent severe without psychotic features: Secondary | ICD-10-CM | POA: Diagnosis not present

## 2022-01-25 DIAGNOSIS — F332 Major depressive disorder, recurrent severe without psychotic features: Secondary | ICD-10-CM | POA: Diagnosis not present

## 2022-02-01 DIAGNOSIS — F332 Major depressive disorder, recurrent severe without psychotic features: Secondary | ICD-10-CM | POA: Diagnosis not present

## 2022-02-15 DIAGNOSIS — Z4651 Encounter for fitting and adjustment of gastric lap band: Secondary | ICD-10-CM | POA: Diagnosis not present

## 2022-02-15 DIAGNOSIS — Z9884 Bariatric surgery status: Secondary | ICD-10-CM | POA: Diagnosis not present

## 2022-02-22 DIAGNOSIS — F332 Major depressive disorder, recurrent severe without psychotic features: Secondary | ICD-10-CM | POA: Diagnosis not present

## 2022-02-26 ENCOUNTER — Encounter (HOSPITAL_COMMUNITY): Payer: Self-pay

## 2022-02-26 ENCOUNTER — Emergency Department (HOSPITAL_COMMUNITY)
Admission: EM | Admit: 2022-02-26 | Discharge: 2022-02-26 | Disposition: A | Discharge status: Home / Self Care | Payer: Medicare Other | Attending: Emergency Medicine | Admitting: Emergency Medicine

## 2022-02-26 ENCOUNTER — Other Ambulatory Visit: Payer: Self-pay

## 2022-02-26 DIAGNOSIS — I10 Essential (primary) hypertension: Secondary | ICD-10-CM | POA: Diagnosis not present

## 2022-02-26 DIAGNOSIS — T7840XA Allergy, unspecified, initial encounter: Secondary | ICD-10-CM | POA: Insufficient documentation

## 2022-02-26 DIAGNOSIS — R220 Localized swelling, mass and lump, head: Secondary | ICD-10-CM | POA: Insufficient documentation

## 2022-02-26 DIAGNOSIS — Z794 Long term (current) use of insulin: Secondary | ICD-10-CM | POA: Diagnosis not present

## 2022-02-26 DIAGNOSIS — L299 Pruritus, unspecified: Secondary | ICD-10-CM | POA: Diagnosis not present

## 2022-02-26 DIAGNOSIS — Z7982 Long term (current) use of aspirin: Secondary | ICD-10-CM | POA: Diagnosis not present

## 2022-02-26 DIAGNOSIS — E119 Type 2 diabetes mellitus without complications: Secondary | ICD-10-CM | POA: Insufficient documentation

## 2022-02-26 DIAGNOSIS — Z7984 Long term (current) use of oral hypoglycemic drugs: Secondary | ICD-10-CM | POA: Insufficient documentation

## 2022-02-26 DIAGNOSIS — Z79899 Other long term (current) drug therapy: Secondary | ICD-10-CM | POA: Diagnosis not present

## 2022-02-26 MED ORDER — PREDNISONE 20 MG PO TABS: 40.0000 mg | ORAL_TABLET | Freq: Every day | ORAL | 0 refills | Status: AC

## 2022-02-26 MED ORDER — TRIAMCINOLONE ACETONIDE 0.1 % EX CREA: 1.0000 | TOPICAL_CREAM | Freq: Two times a day (BID) | CUTANEOUS | 0 refills | Status: AC

## 2022-02-26 NOTE — Discharge Instructions (Addendum)
You were seen in the ER for evaluation of you facial swelling and itching. I think this is an allergic reaction to an insect bite you have on your back. For this, I am going to prescribe you a prednisone burst to take. Additionally, please make sure you are keeping your skin well hydrated.  I have prescribed you triamcinolone cream to apply to your hands daily.  Please make sure you are wearing cotton gloves to keep the moisture in.  Please make sure you are hydrating her skin every day with a thick emollient such as Vaseline or Aquaphor.  Please limit hot water use and try to use warm to cool showers to keep the hydration in your skin.  You can continue taking Benadryl daily although this may make you tired.  Please be advised.  If you have any trouble swallowing, trouble breathing, shortness of breath, chest pain, rash, fever, worsening swelling, neck pain, please return to the nearest emergency department for reevaluation.  I would like you to follow-up with your primary care doctor in the next few days for reevaluation.  Contact a health care provider if: Your symptoms do not get better with treatment. Get help right away if: You have symptoms of anaphylaxis. These include: Swollen mouth, tongue, or throat. Pain or tightness in your chest. Trouble breathing or shortness of breath. Dizziness or fainting. Severe abdominal pain, vomiting, or diarrhea. These symptoms may represent a serious problem that is an emergency. Do not wait to see if the symptoms will go away. Get medical help right away. Call your local emergency services (911 in the [place] Do not drive yourself to the hospital.

## 2022-02-26 NOTE — ED Triage Notes (Signed)
Patient states she began having right sided facial swelling yesterday and today the left and under her chin as well. Patient states she is having itching on the right shoulder area. No rash present.  Patient denies any swelling to the tongue. Patient denies any difficulty swallowing or breathing.

## 2022-02-26 NOTE — ED Provider Notes (Signed)
[facility] DEPT Provider Note   CSN: [id number] Arrival date & time: 02/26/22  6440     History Chief Complaint  Patient presents with   Facial Swelling    Amy [name] is a 66 y.o. female with h/o HTN, DM, GERD, HLD presents to the ED for evaluation of gradually improving facial swelling and itching skin for the past 1.5 days. The patient reports that she thinks she may be been bit by something on her back as she has some itching in her upper left back. The patient reports that her facial swelling looked worse yesterday, however she did take some Benadryl and her swelling has improved.  She reports she had mainly eye swelling and minimal lip swelling. She denies any new medications, laundry detergents, soaps, foods, or lotions. She denies any recent tick bites. She denies any rashes, trouble breathing, trouble swallowing, chest pain, SOB, fever, abdominal pain, nausea, vomiting sore throat, headache, blurry vision. She denies any extended amounts of time outside.   HPI     Home Medications Prior to Admission medications   Medication Sig Start Date End Date Taking? Authorizing Provider  Acetaminophen (TYLENOL PO) Take 500 mg by mouth as needed.    [provider]  aspirin 81 MG tablet Take 81 mg by mouth daily.    [provider]  cetirizine (ZYRTEC) 10 MG tablet Take 10 mg by mouth daily.    [provider]  Continuous Blood Gluc Sensor (FREESTYLE LIBRE SENSOR SYSTEM) MISC USE 1 SENSOR EVERY 10 DAYS 11/08/17   [provider]  Dulaglutide (TRULICITY) 1.5 HK/7.4QV SOPN Trulicity 1.5 ZD/6.3 mL subcutaneous pen injector    [provider]  ergocalciferol (VITAMIN D2) 50000 UNITS capsule Take 50,000 Units by mouth every Monday.    [provider]  FERREX 150 150 MG capsule Take by mouth daily. 10/19/17   [provider]  FLUOCINOLONE ACETONIDE SCALP 0.01 % OIL Apply 1 application topically 2 (two)  times a week.  08/29/13   [provider]  furosemide (LASIX) 40 MG tablet Take 1 tablet by mouth daily.    [provider]  gabapentin (NEURONTIN) 600 MG tablet Take 600 mg by mouth at bedtime.    [provider]  hydroquinone 4 % cream APPLY TO AFFECTED AREA ON FACE EVERY NIGHT 03/09/19   [provider]  Insulin Glargine (LANTUS SOLOSTAR) 100 UNIT/ML Solostar Pen Inject 26 Units into the skin at bedtime. Patient taking differently: Inject 36 Units into the skin at bedtime. 11/20/16   Elayne Snare, MD  Insulin Pen Needle (FIFTY50 PEN NEEDLES) 31G X 5 MM MISC BD Ultra-Fine Mini Pen Needle 31 gauge x 3/16"    [provider]  KLOR-CON M20 20 MEQ tablet Take 40 mEq by mouth daily.  08/29/13   [provider]  L-Methylfolate-Algae (L-METHYLFOLATE FORTE) 15-90.314 MG CAPS Take 1 tablet by mouth at bedtime.    [provider]  metFORMIN (GLUCOPHAGE-XR) 500 MG 24 hr tablet Take 1,000 mg by mouth 2 (two) times daily. 06/05/19   [provider]  NOVOLOG FLEXPEN 100 UNIT/ML FlexPen INJECT 3 TO 12 UNITS SUBCUTANEOUSLY 3 TIMES A DAY 03/10/19   [provider]  omeprazole (PRILOSEC) 40 MG capsule TAKE 1 CAPSULE BY MOUTH TWICE A DAY 11/21/21   Nelida Meuse III, MD  telmisartan (MICARDIS) 80 MG tablet Take 80 mg by mouth daily.    [provider]  traZODone (DESYREL) 50 MG tablet 4 tablets  at night 12/04/18   [provider]  triamcinolone (NASACORT) 55 MCG/ACT AERO nasal inhaler Place 2 sprays into the nose daily.    [provider]  triamcinolone cream (KENALOG) 0.1 % triamcinolone acetonide 0.1 % topical cream    [provider]  TRINTELLIX 20 MG TABS tablet Take 20 mg by mouth daily. 10/27/17   [provider]  zaleplon (SONATA) 10 MG capsule Take 20 mg by mouth at bedtime. 06/11/19   [provider]  imipramine (TOFRANIL-PM) 150 MG capsule Take 4 by mouth at bedtime   11/10/11   [provider]      Allergies    Patient has no known allergies.    Review of Systems   Review of Systems  Constitutional:  Negative for chills and fever.  HENT:  Positive for facial swelling. Negative for congestion, dental problem, rhinorrhea, sore throat and trouble swallowing.   Eyes:  Negative for photophobia and visual disturbance.  Respiratory:  Negative for cough and shortness of breath.   Cardiovascular:  Negative for chest pain.  Gastrointestinal:  Negative for abdominal pain, nausea and vomiting.  Musculoskeletal:  Negative for arthralgias and joint swelling.  Skin:  Negative for rash.       Reports itching skin  Neurological:  Negative for weakness and headaches.    Physical Exam Updated Vital Signs BP 126/71 (BP Location: Left Arm)   Pulse 75   Temp (!) 97.4 F (36.3 C) (Oral)   Resp 16   Ht '5\' 4"'$  (1.626 m)   Wt 75.3 kg   SpO2 97%   BMI 28.49 kg/m  Physical Exam Vitals and nursing note reviewed.  Constitutional:      General: She is not in acute distress.    Appearance: Normal appearance. She is not ill-appearing or toxic-appearing.  HENT:     Head: Normocephalic and atraumatic.     Right Ear: Tympanic membrane normal.     Left Ear: Tympanic membrane normal.     Nose: Nose normal.     Mouth/Throat:     Mouth: Mucous membranes are moist.     Pharynx: No oropharyngeal exudate or posterior oropharyngeal erythema.     Comments: No lip swelling noted.  Dentition intact and nontender.  No pharyngeal erythema, edema, or exudate noted.  Uvula midline.  Airway patent.  Patient is controlling secretions and speaking in full sentences with ease. Eyes:     General: No scleral icterus.    Extraocular Movements: Extraocular movements intact.     Conjunctiva/sclera: Conjunctivae normal.     Pupils: Pupils are equal, round, and reactive to light.     Comments: Minimal facial swelling noted to the upper and lower lids bilaterally.  No overlying erythema or  warmth.  EOMI without pain.  PERRLA.  No discharge noted.  Nontender to palpation.  Cardiovascular:     Rate and Rhythm: Normal rate and regular rhythm.  Pulmonary:     Effort: Pulmonary effort is normal. No respiratory distress.     Breath sounds: Normal breath sounds.     Comments: Lungs are clear to auscultation bilaterally. Abdominal:     General: Abdomen is flat. Bowel sounds are normal.     Palpations: Abdomen is soft.     Tenderness: There is no abdominal tenderness. There is no guarding or rebound.  Musculoskeletal:        General: No deformity.     Cervical back: Normal range of motion.  Lymphadenopathy:     Cervical:  No cervical adenopathy.  Skin:    General: Skin is warm and dry.     Findings: No rash.     Comments: Patient has some dry skin noted to the left upper shoulder/back.  No vesicles, papules, macules, or petechiae.  There is some excoriated marks noted.  Area is nontender to palpation.  No overlying erythema or warmth.  Patient has diffusely dry skin including her palms.  Again, I do not see any rash, lesions, macules, papules, or petechiae on any exposed skin or easily visualized skin giving her clothing.  No skin sloughing noted.  Neurological:     General: No focal deficit present.     Mental Status: She is alert. Mental status is at baseline.     ED Results / Procedures / Treatments   Labs (all labs ordered are listed, but only abnormal results are displayed) Labs Reviewed - No data to display  EKG None  Radiology No results found.  Procedures Procedures   Medications Ordered in ED Medications - No data to display  ED Course/ Medical Decision Making/ A&P                           Medical Decision Making Risk Prescription drug management.   66 y/o F presents to the ED for evaluation of gradually improving facial swelling onset the past 1.5 days. Differential diagnosis includes but is not limited to allergic reaction, seasonal allergies, drug  reaction, cellulitis, angioedema. Vital signs are unremarkable. Physical exam is pertinent for patient has some dry skin noted to the left upper shoulder/back.  No vesicles, papules, macules, or petechiae.  There is some excoriated marks noted.  Area is nontender to palpation.  No overlying erythema or warmth.  Patient has diffusely dry skin including her palms.  Again, I do not see any rash, lesions, macules, papules, or petechiae on any exposed skin or easily visualized skin giving her clothing.  No skin sloughing noted.  Normal OP exam.  No pharyngeal erythema, edema, or exudate noted.  Patient speaking in full sentences with ease and controlling secretions.  She has minimal facial swelling, mainly to the upper and lower lids bilaterally.  No erythema or warmth to the area.  Nontender.  EOMI is intact.  After discussion with my attending, I do not think any additional labs are needed at this time.  Unsure if this is a drug reaction given that her facial swelling has improved and she continues to take her daily medications.  She has not had any other allergic components other than facial swelling.  She is not having any rash or any trouble breathing or trouble swallowing.  No chest pain or shortness of breath.  No abdominal pain, nausea, or vomiting.  This does not appear to be cellulitic as there is no erythema or warmth to the area.  Could possibly be an allergic reaction or allergies.  We will send patient home on prednisone as well as some triamcinolone cream.  I discussed with the patient to use Aquaphor and Vaseline over her skin to help with the dryness and itchiness.  I also recommended that she follow-up with her primary care doctor within the week for reevaluation.  Will discharge the patient home on prednisone burst as well as some triamcinolone cream.Discussed keeping her skin well hydrated with thick emollients/lotions. Discussed using the cream on her hands with cotton gloves.  We discussed  strict return precautions and red flag symptoms. Precautions  were confirmed using the teach back method. The patient verbalizes understanding and agrees to the plan. The patient is stable and being discharged home in good condition.    Final Clinical Impression(s) / ED Diagnoses Final diagnoses:  Allergic reaction, initial encounter    Rx / DC Orders ED Discharge Orders          Ordered    predniSONE (DELTASONE) 20 MG tablet  Daily        02/26/22 1324    triamcinolone cream (KENALOG) 0.1 %  2 times daily        02/26/22 1324              Sherrell Puller, [place] 02/26/22 2312    Tegeler, Gwenyth Allegra, MD 03/02/22 [phone]

## 2022-03-01 ENCOUNTER — Other Ambulatory Visit: Payer: Self-pay | Admitting: Gastroenterology

## 2022-03-02 DIAGNOSIS — F332 Major depressive disorder, recurrent severe without psychotic features: Secondary | ICD-10-CM | POA: Diagnosis not present

## 2022-03-03 DIAGNOSIS — E1140 Type 2 diabetes mellitus with diabetic neuropathy, unspecified: Secondary | ICD-10-CM | POA: Diagnosis not present

## 2022-03-03 DIAGNOSIS — E785 Hyperlipidemia, unspecified: Secondary | ICD-10-CM | POA: Diagnosis not present

## 2022-03-03 DIAGNOSIS — F3175 Bipolar disorder, in partial remission, most recent episode depressed: Secondary | ICD-10-CM | POA: Diagnosis not present

## 2022-03-03 DIAGNOSIS — I10 Essential (primary) hypertension: Secondary | ICD-10-CM | POA: Diagnosis not present

## 2022-03-15 DIAGNOSIS — F332 Major depressive disorder, recurrent severe without psychotic features: Secondary | ICD-10-CM | POA: Diagnosis not present

## 2022-03-22 DIAGNOSIS — F332 Major depressive disorder, recurrent severe without psychotic features: Secondary | ICD-10-CM | POA: Diagnosis not present

## 2022-03-29 DIAGNOSIS — F332 Major depressive disorder, recurrent severe without psychotic features: Secondary | ICD-10-CM | POA: Diagnosis not present

## 2022-04-03 DIAGNOSIS — Z124 Encounter for screening for malignant neoplasm of cervix: Secondary | ICD-10-CM | POA: Diagnosis not present

## 2022-04-03 DIAGNOSIS — Z1151 Encounter for screening for human papillomavirus (HPV): Secondary | ICD-10-CM | POA: Diagnosis not present

## 2022-04-03 DIAGNOSIS — Z01419 Encounter for gynecological examination (general) (routine) without abnormal findings: Secondary | ICD-10-CM | POA: Diagnosis not present

## 2022-04-03 DIAGNOSIS — N951 Menopausal and female climacteric states: Secondary | ICD-10-CM | POA: Diagnosis not present

## 2022-04-05 DIAGNOSIS — F332 Major depressive disorder, recurrent severe without psychotic features: Secondary | ICD-10-CM | POA: Diagnosis not present

## 2022-04-26 DIAGNOSIS — Z1329 Encounter for screening for other suspected endocrine disorder: Secondary | ICD-10-CM | POA: Diagnosis not present

## 2022-04-26 DIAGNOSIS — Z130 Encounter for screening for diseases of the blood and blood-forming organs and certain disorders involving the immune mechanism: Secondary | ICD-10-CM | POA: Diagnosis not present

## 2022-05-02 DIAGNOSIS — E1165 Type 2 diabetes mellitus with hyperglycemia: Secondary | ICD-10-CM | POA: Diagnosis not present

## 2022-05-02 DIAGNOSIS — Z9884 Bariatric surgery status: Secondary | ICD-10-CM | POA: Diagnosis not present

## 2022-05-02 DIAGNOSIS — I10 Essential (primary) hypertension: Secondary | ICD-10-CM | POA: Diagnosis not present

## 2022-05-02 DIAGNOSIS — E7800 Pure hypercholesterolemia, unspecified: Secondary | ICD-10-CM | POA: Diagnosis not present

## 2022-05-04 DIAGNOSIS — F332 Major depressive disorder, recurrent severe without psychotic features: Secondary | ICD-10-CM | POA: Diagnosis not present

## 2022-05-10 DIAGNOSIS — F332 Major depressive disorder, recurrent severe without psychotic features: Secondary | ICD-10-CM | POA: Diagnosis not present

## 2022-05-17 DIAGNOSIS — F332 Major depressive disorder, recurrent severe without psychotic features: Secondary | ICD-10-CM | POA: Diagnosis not present

## 2022-05-24 DIAGNOSIS — F900 Attention-deficit hyperactivity disorder, predominantly inattentive type: Secondary | ICD-10-CM | POA: Diagnosis not present

## 2022-05-24 DIAGNOSIS — F3341 Major depressive disorder, recurrent, in partial remission: Secondary | ICD-10-CM | POA: Diagnosis not present

## 2022-05-31 DIAGNOSIS — F332 Major depressive disorder, recurrent severe without psychotic features: Secondary | ICD-10-CM | POA: Diagnosis not present

## 2022-06-06 DIAGNOSIS — I10 Essential (primary) hypertension: Secondary | ICD-10-CM | POA: Diagnosis not present

## 2022-06-06 DIAGNOSIS — I700 Atherosclerosis of aorta: Secondary | ICD-10-CM | POA: Diagnosis not present

## 2022-06-06 DIAGNOSIS — M109 Gout, unspecified: Secondary | ICD-10-CM | POA: Diagnosis not present

## 2022-06-06 DIAGNOSIS — E785 Hyperlipidemia, unspecified: Secondary | ICD-10-CM | POA: Diagnosis not present

## 2022-06-06 DIAGNOSIS — I6522 Occlusion and stenosis of left carotid artery: Secondary | ICD-10-CM | POA: Diagnosis not present

## 2022-06-08 ENCOUNTER — Telehealth: Payer: Self-pay

## 2022-06-08 NOTE — Patient Outreach (Signed)
  Care Coordination   06/08/2022 Name: Amy [name] MRN: [id number] DOB: [birth year]   Care Coordination Outreach Attempts:  An unsuccessful telephone outreach was attempted today to offer the patient information about available care coordination services as a benefit of their health plan.   Follow Up Plan:  Additional outreach attempts will be made to offer the patient care coordination information and services.   Encounter Outcome:  No Answer  Care Coordination Interventions Activated:  No   Care Coordination Interventions:  No, not indicated    Jone Baseman, RN, MSN [facility] Care Management Care Management Coordinator Direct Line [phone]

## 2022-06-13 DIAGNOSIS — I10 Essential (primary) hypertension: Secondary | ICD-10-CM | POA: Diagnosis not present

## 2022-06-13 DIAGNOSIS — E1165 Type 2 diabetes mellitus with hyperglycemia: Secondary | ICD-10-CM | POA: Diagnosis not present

## 2022-06-14 DIAGNOSIS — F332 Major depressive disorder, recurrent severe without psychotic features: Secondary | ICD-10-CM | POA: Diagnosis not present

## 2022-06-17 ENCOUNTER — Other Ambulatory Visit: Payer: Self-pay

## 2022-06-17 ENCOUNTER — Encounter (HOSPITAL_COMMUNITY): Payer: Self-pay

## 2022-06-17 ENCOUNTER — Emergency Department (HOSPITAL_COMMUNITY): Payer: Medicare Other

## 2022-06-17 ENCOUNTER — Emergency Department (HOSPITAL_COMMUNITY)
Admission: EM | Admit: 2022-06-17 | Discharge: 2022-06-17 | Disposition: A | Discharge status: Home / Self Care | Payer: Medicare Other | Attending: Emergency Medicine | Admitting: Emergency Medicine

## 2022-06-17 DIAGNOSIS — Z794 Long term (current) use of insulin: Secondary | ICD-10-CM | POA: Diagnosis not present

## 2022-06-17 DIAGNOSIS — E876 Hypokalemia: Secondary | ICD-10-CM

## 2022-06-17 DIAGNOSIS — Z7982 Long term (current) use of aspirin: Secondary | ICD-10-CM | POA: Diagnosis not present

## 2022-06-17 DIAGNOSIS — I10 Essential (primary) hypertension: Secondary | ICD-10-CM | POA: Insufficient documentation

## 2022-06-17 DIAGNOSIS — Z7984 Long term (current) use of oral hypoglycemic drugs: Secondary | ICD-10-CM | POA: Diagnosis not present

## 2022-06-17 DIAGNOSIS — R072 Precordial pain: Secondary | ICD-10-CM | POA: Diagnosis not present

## 2022-06-17 DIAGNOSIS — E119 Type 2 diabetes mellitus without complications: Secondary | ICD-10-CM | POA: Diagnosis not present

## 2022-06-17 DIAGNOSIS — R0602 Shortness of breath: Secondary | ICD-10-CM | POA: Diagnosis not present

## 2022-06-17 DIAGNOSIS — R079 Chest pain, unspecified: Secondary | ICD-10-CM | POA: Diagnosis not present

## 2022-06-17 LAB — CBC WITH DIFFERENTIAL/PLATELET
Abs Immature Granulocytes: 0.01 10*3/uL (ref 0.00–0.07)
Basophils Absolute: 0 10*3/uL (ref 0.0–0.1)
Basophils Relative: 1 %
Eosinophils Absolute: 0.2 10*3/uL (ref 0.0–0.5)
Eosinophils Relative: 3 %
HCT: 41.3 % (ref 36.0–46.0)
Hemoglobin: 12.9 g/dL (ref 12.0–15.0)
Immature Granulocytes: 0 %
Lymphocytes Relative: 19 %
Lymphs Abs: 1.2 10*3/uL (ref 0.7–4.0)
MCH: 29.8 pg (ref 26.0–34.0)
MCHC: 31.2 g/dL (ref 30.0–36.0)
MCV: 95.4 fL (ref 80.0–100.0)
Monocytes Absolute: 0.5 10*3/uL (ref 0.1–1.0)
Monocytes Relative: 8 %
Neutro Abs: 4.4 10*3/uL (ref 1.7–7.7)
Neutrophils Relative %: 69 %
Platelets: 210 10*3/uL (ref 150–400)
RBC: 4.33 MIL/uL (ref 3.87–5.11)
RDW: 12.7 % (ref 11.5–15.5)
WBC: 6.3 10*3/uL (ref 4.0–10.5)
nRBC: 0 % (ref 0.0–0.2)

## 2022-06-17 LAB — COMPREHENSIVE METABOLIC PANEL
ALT: 91 U/L — ABNORMAL HIGH (ref 0–44)
AST: 67 U/L — ABNORMAL HIGH (ref 15–41)
Albumin: 4.4 g/dL (ref 3.5–5.0)
Alkaline Phosphatase: 167 U/L — ABNORMAL HIGH (ref 38–126)
Anion gap: 7 (ref 5–15)
BUN: 7 mg/dL — ABNORMAL LOW (ref 8–23)
CO2: 29 mmol/L (ref 22–32)
Calcium: 8.9 mg/dL (ref 8.9–10.3)
Chloride: 101 mmol/L (ref 98–111)
Creatinine, Ser: 0.88 mg/dL (ref 0.44–1.00)
GFR, Estimated: >60 mL/min (ref >60)
Glucose, Bld: 165 mg/dL — ABNORMAL HIGH (ref 70–99)
Potassium: 3 mmol/L — ABNORMAL LOW (ref 3.5–5.1)
Sodium: 137 mmol/L (ref 135–145)
Total Bilirubin: 0.6 mg/dL (ref 0.3–1.2)
Total Protein: 7.4 g/dL (ref 6.5–8.1)

## 2022-06-17 LAB — LIPASE, BLOOD: Lipase: 35 U/L (ref 11–51)

## 2022-06-17 LAB — D-DIMER, QUANTITATIVE: D-Dimer, Quant: 0.35 ug/mL-FEU (ref 0.00–0.50)

## 2022-06-17 LAB — TROPONIN I (HIGH SENSITIVITY)
Troponin I (High Sensitivity): 2 ng/L (ref <18)
Troponin I (High Sensitivity): <2 ng/L (ref <18)

## 2022-06-17 MED ORDER — ASPIRIN 81 MG PO CHEW
324.0000 mg | CHEWABLE_TABLET | Freq: Once | ORAL | Status: AC
  Administered 2022-06-17: 324 mg via ORAL
  Filled 2022-06-17: qty 4

## 2022-06-17 MED ORDER — POTASSIUM CHLORIDE CRYS ER 20 MEQ PO TBCR
40.0000 meq | EXTENDED_RELEASE_TABLET | Freq: Once | ORAL | Status: AC
  Administered 2022-06-17: 40 meq via ORAL
  Filled 2022-06-17: qty 2

## 2022-06-17 NOTE — Discharge Instructions (Addendum)
You were seen in the emergency department today for chest pain.  As we discussed your lab work, EKG, chest x-ray all looked reassuring today.  I recommend monitoring your stress levels.  I also recommend that you rest and take Tylenol as needed for any continued pain.  Additionally, continue to monitor how you are doing overall, and return to the emergency department for any new or worsening symptoms such as: Worsening pain or pain with exertion, difficulty breathing, sweating, or pain or swelling in your legs.  Additionally, your potassium was slightly low.  We did give you a potassium pill in the ER today for this. You will need to follow-up with your primary care doctor to have this rechecked at your earliest convenience.

## 2022-06-17 NOTE — ED Provider Notes (Cosign Needed Addendum)
[facility] DEPT Provider Note   CSN: [id number] Arrival date & time: 06/17/22  2595     History  Chief Complaint  Patient presents with   Chest Pain    Amy [name] is a 66 y.o. female.  Patient with history of diabetes and hypertension presents today with complaints of chest pain. She states that same began at 6 am this morning and awoke her from sleep and has been persistent since onset. States that the pain is left sided and sharp in nature and does not radiate. She states that the pain is also pleuritic in nature and reproducible to palpation. She denies any known cardiac history. No leg pain or leg swelling, PND, or orthopnea. Also denies shortness of breath, nausea, vomiting, or diaphoresis. Denies any recent travel or recent surgeries, no history of blood clots.   The history is provided by the patient. No language interpreter was used.  Chest Pain      Home Medications Prior to Admission medications   Medication Sig Start Date End Date Taking? Authorizing Provider  Acetaminophen (TYLENOL PO) Take 500 mg by mouth as needed.    [provider]  aspirin 81 MG tablet Take 81 mg by mouth daily.    [provider]  cetirizine (ZYRTEC) 10 MG tablet Take 10 mg by mouth daily.    [provider]  Continuous Blood Gluc Sensor (FREESTYLE LIBRE SENSOR SYSTEM) MISC USE 1 SENSOR EVERY 10 DAYS 11/08/17   [provider]  Dulaglutide (TRULICITY) 1.5 GL/8.7FI SOPN Trulicity 1.5 EP/3.2 mL subcutaneous pen injector    [provider]  ergocalciferol (VITAMIN D2) 50000 UNITS capsule Take 50,000 Units by mouth every Monday.    [provider]  FERREX 150 150 MG capsule Take by mouth daily. 10/19/17   [provider]  FLUOCINOLONE ACETONIDE SCALP 0.01 % OIL Apply 1 application topically 2 (two) times a week.  08/29/13   [provider]  furosemide (LASIX) 40 MG tablet Take 1 tablet by mouth  daily.    [provider]  gabapentin (NEURONTIN) 600 MG tablet Take 600 mg by mouth at bedtime.    [provider]  hydroquinone 4 % cream APPLY TO AFFECTED AREA ON FACE EVERY NIGHT 03/09/19   [provider]  Insulin Glargine (LANTUS SOLOSTAR) 100 UNIT/ML Solostar Pen Inject 26 Units into the skin at bedtime. Patient taking differently: Inject 36 Units into the skin at bedtime. 11/20/16   Elayne Snare, MD  Insulin Pen Needle (FIFTY50 PEN NEEDLES) 31G X 5 MM MISC BD Ultra-Fine Mini Pen Needle 31 gauge x 3/16"    [provider]  KLOR-CON M20 20 MEQ tablet Take 40 mEq by mouth daily.  08/29/13   [provider]  L-Methylfolate-Algae (L-METHYLFOLATE FORTE) 15-90.314 MG CAPS Take 1 tablet by mouth at bedtime.    [provider]  metFORMIN (GLUCOPHAGE-XR) 500 MG 24 hr tablet Take 1,000 mg by mouth 2 (two) times daily. 06/05/19   [provider]  NOVOLOG FLEXPEN 100 UNIT/ML FlexPen INJECT 3 TO 12 UNITS SUBCUTANEOUSLY 3 TIMES A DAY 03/10/19   [provider]  omeprazole (PRILOSEC) 40 MG capsule TAKE 1 CAPSULE BY MOUTH TWICE A DAY 03/01/22   Nelida Meuse III, MD  telmisartan (MICARDIS) 80 MG tablet Take 80 mg by mouth daily.    [provider]  traZODone (DESYREL) 50 MG tablet 4 tablets at night 12/04/18   [provider]  triamcinolone (NASACORT) 55 MCG/ACT  AERO nasal inhaler Place 2 sprays into the nose daily.    [provider]  triamcinolone cream (KENALOG) 0.1 % triamcinolone acetonide 0.1 % topical cream    [provider]  triamcinolone cream (KENALOG) 0.1 % Apply 1 Application topically 2 (two) times daily. 02/26/22   Sherrell Puller, PA-C  TRINTELLIX 20 MG TABS tablet Take 20 mg by mouth daily. 10/27/17   [provider]  zaleplon (SONATA) 10 MG capsule Take 20 mg by mouth at bedtime. 06/11/19   [provider]  imipramine (TOFRANIL-PM) 150 MG capsule Take 4 by mouth at bedtime    11/10/11  [provider]      Allergies    Patient has no known allergies.    Review of Systems   Review of Systems  Cardiovascular:  Positive for chest pain.  All other systems reviewed and are negative.   Physical Exam Updated Vital Signs BP 130/85 (BP Location: Right Arm)   Pulse 86   Temp 99 F (37.2 C) (Oral)   Resp 14   Ht '5\' 5"'  (1.651 m)   Wt 75.3 kg   BMI 27.62 kg/m  Physical Exam Vitals and nursing note reviewed.  Constitutional:      General: She is not in acute distress.    Appearance: Normal appearance. She is normal weight. She is not ill-appearing, toxic-appearing or diaphoretic.  HENT:     Head: Normocephalic and atraumatic.  Cardiovascular:     Rate and Rhythm: Normal rate and regular rhythm.     Pulses:          Dorsalis pedis pulses are 2+ on the right side and 2+ on the left side.       Posterior tibial pulses are 2+ on the right side and 2+ on the left side.     Heart sounds: Normal heart sounds.  Pulmonary:     Effort: Pulmonary effort is normal. No respiratory distress.     Breath sounds: Normal breath sounds.  Chest:     Chest wall: Tenderness present.  Abdominal:     Palpations: Abdomen is soft.  Musculoskeletal:        General: Normal range of motion.     Cervical back: Normal range of motion.     Right lower leg: No tenderness. No edema.     Left lower leg: No tenderness. No edema.  Skin:    General: Skin is warm and dry.  Neurological:     General: No focal deficit present.     Mental Status: She is alert.  Psychiatric:        Mood and Affect: Mood normal.        Behavior: Behavior normal.     ED Results / Procedures / Treatments   Labs (all labs ordered are listed, but only abnormal results are displayed) Labs Reviewed  CBC WITH DIFFERENTIAL/PLATELET  COMPREHENSIVE METABOLIC PANEL  LIPASE, BLOOD  D-DIMER, QUANTITATIVE  TROPONIN I (HIGH SENSITIVITY)    EKG None  Radiology DG Chest Portable 1 View  Result  Date: 06/17/2022 CLINICAL DATA:  Chest pain and shortness of breath. EXAM: PORTABLE CHEST 1 VIEW COMPARISON:  06/27/2019 and prior studies FINDINGS: The cardiomediastinal silhouette is unremarkable. There is no evidence of focal airspace disease, pulmonary edema, suspicious pulmonary nodule/mass, pleural effusion, or pneumothorax. No acute bony abnormalities are identified. IMPRESSION: No active disease. Electronically Signed   By: Margarette Canada M.D.   On: 06/17/2022 09:48    Procedures Procedures  Medications Ordered in ED Medications  aspirin chewable tablet 324 mg (324 mg Oral Given 06/17/22 1016)  potassium chloride SA (KLOR-CON M) CR tablet 40 mEq (40 mEq Oral Given 06/17/22 1231)    ED Course/ Medical Decision Making/ A&P                           Medical Decision Making Amount and/or Complexity of Data Reviewed Labs: ordered. Radiology: ordered.  Risk OTC drugs. Prescription drug management.    This patient presents to the ED for concern of chest pain, this involves an extensive number of treatment options, and is a complaint that carries with it a high risk of complications and morbidity.  The differential diagnosis includes ACS, pericarditis, aortic dissection, PE, pneumothorax, esophageal spasm or rupture, chronic angina, valvular disease, cardiomyopathy, myocarditis, pulmonary HTN, pneumonia, bronchitis, GERD, reflux/PUD, biliary disease, pancreatitis, disk disease, costochondritis, anxiety or panic attack This is not an exhaustive differential    Co morbidities that complicate the patient evaluation  Hx diabetes, hypertension   Additional history obtained:  Additional history obtained from epic chart review   Lab Tests:  I Ordered, and personally interpreted labs.  The pertinent results include:  no leukocytosis or anemia, K 3.0, AST 67, ALT 91, alk phos 167. Minimally changed from previous. Patient without abdominal pain. Troponin negative, d dimer  normal   Imaging Studies ordered:  I ordered imaging studies including CXR  I independently visualized and interpreted imaging which showed NAD I agree with the radiologist interpretation   Cardiac Monitoring: / EKG:  The patient was maintained on a cardiac monitor.  I personally viewed and interpreted the cardiac monitored which showed an underlying rhythm of: no STEMI   Problem List / ED Course / Critical interventions / Medication management  I ordered medication including 324 ASA,  for chest pain, oral potassium for hypokalemia Reevaluation of the patient after these medicines showed that the patient improved I have reviewed the patients home medicines and have made adjustments as needed   Test / Admission - Considered:  Given the large differential diagnosis for Baxter Kail, the decision making in this case is of high complexity.  After evaluating all of the data points in this case, the presentation of AMICA HARRON is NOT consistent with Acute Coronary Syndrome (ACS) and/or myocardial ischemia, pulmonary embolism, aortic dissection; Borhaave's, significant arrythmia, pneumothorax, cardiac tamponade, or other emergent cardiopulmonary condition.  Further, the presentation of KALLIE DEPOLO is NOT consistent with pericarditis, myocarditis, pancreatitis, mediastinitis, endocarditis, new valvular disease.  Additionally, the presentation of Lydia Toren Miltonis NOT consistent with flail chest, cardiac contusion, ARDS, or significant intra-thoracic bleeding.  Moreover, this presentation is NOT consistent with pneumonia or sepsis.  The patient has a   heart score 2, low risk. D dimer and troponin reassuring. Upon reassessment, patient states that her pain has now resolved. She is therefore stable for discharge.  Patient is understanding and amenable with plan, emphasized importance of close PCP follow-up.  Also educated on red flag symptoms of prompt immediate return.  Patient  discharged in stable condition.  Strict return and follow-up precautions have been given by me personally or by detailed written instruction given verbally by nursing staff using the teach back method to the patient/family/caregiver(s).  Data Reviewed/Counseling: I have reviewed the patient's vital signs, nursing notes, and other relevant tests/information. I had a detailed discussion regarding the historical points, exam findings, and any diagnostic results supporting  the discharge diagnosis. I also discussed the need for outpatient follow-up and the need to return to the ED if symptoms worsen or if there are any questions or concerns that arise at home.  Findings and plan of care discussed with supervising physician Dr. Ronnald Nian who is in agreement.    Final Clinical Impression(s) / ED Diagnoses Final diagnoses:  Precordial chest pain  Hypokalemia    Rx / DC Orders ED Discharge Orders     None     An After Visit Summary was printed and given to the patient.     Bud Face, PA-C 06/17/22 1453    SmootLeary Roca, PA-C 06/17/22 [place], [place], DO 06/18/22 [phone]

## 2022-06-17 NOTE — ED Notes (Signed)
Unable to obtain Sats reading due to patient's fingers being cold

## 2022-06-17 NOTE — ED Triage Notes (Signed)
Patient c/o mid chest pain that woke her up at 0600 today. Patient denies SOB, N/V, or dizziness.

## 2022-06-23 ENCOUNTER — Telehealth: Payer: Self-pay

## 2022-06-23 NOTE — Patient Outreach (Signed)
  Care Coordination   06/23/2022 Name: Amy [name] MRN: [id number] DOB: Feb 18, 1956   Care Coordination Outreach Attempts:  A second unsuccessful outreach was attempted today to offer the patient with information about available care coordination services as a benefit of their health plan.     Follow Up Plan:  Additional outreach attempts will be made to offer the patient care coordination information and services.   Encounter Outcome:  No Answer  Care Coordination Interventions Activated:  No   Care Coordination Interventions:  No, not indicated    Jone Baseman, RN, MSN [facility] Care Management Care Management Coordinator Direct Line [phone]

## 2022-07-12 ENCOUNTER — Telehealth: Payer: Self-pay

## 2022-07-12 NOTE — Patient Instructions (Signed)
Visit Information  Thank you for taking time to visit with me today. Please don't hesitate to contact me if I can be of assistance to you.   Following are the goals we discussed today:   Goals Addressed             This Visit's Progress    COMPLETED: Care coordiation activities-No follow up required       Care Coordination Interventions: Advised patient to schedule annual wellness visit.  Discussed diabetes management and [facility] services and support.  Patient declined.           If you are experiencing a Mental Health or [place] or need someone to talk to, please call the Suicide and Crisis Lifeline: 988   Patient verbalizes understanding of instructions and care plan provided today and agrees to view in [place]. Active MyChart status and patient understanding of how to access instructions and care plan via MyChart confirmed with patient.     No further follow up required: decline  Jone Baseman, RN, MSN [facility] Care Management Coordinator Direct Line [phone]

## 2022-07-12 NOTE — Patient Outreach (Signed)
  Care Coordination   Initial Visit Note   07/12/2022 Name: Amy [name] MRN: [id number] DOB: 1955-09-14  Amy [name] is a 66 y.o. year old female who sees Harlan Stains, MD for primary care. I spoke with  Amy [name] by phone today.  What matters to the patients health and wellness today?  none    Goals Addressed             This Visit's Progress    COMPLETED: Care coordiation activities-No follow up required       Care Coordination Interventions: Advised patient to schedule annual wellness visit.  Discussed diabetes management and [facility] services and support.  Patient declined.          SDOH assessments and interventions completed:  Yes  SDOH Interventions Today    Flowsheet Row Most Recent Value  SDOH Interventions   Food Insecurity Interventions Intervention Not Indicated  Transportation Interventions Intervention Not Indicated        Care Coordination Interventions Activated:  Yes  Care Coordination Interventions:  Yes, provided   Follow up plan: No further intervention required.   Encounter Outcome:  Pt. Visit Completed   Jone Baseman, RN, MSN [place] [facility] Care Management Coordinator Direct Line [phone]

## 2022-08-04 ENCOUNTER — Other Ambulatory Visit: Payer: Self-pay | Admitting: Family Medicine

## 2022-08-04 DIAGNOSIS — E785 Hyperlipidemia, unspecified: Secondary | ICD-10-CM | POA: Diagnosis not present

## 2022-08-04 DIAGNOSIS — I10 Essential (primary) hypertension: Secondary | ICD-10-CM | POA: Diagnosis not present

## 2022-08-04 DIAGNOSIS — E2839 Other primary ovarian failure: Secondary | ICD-10-CM

## 2022-08-04 DIAGNOSIS — Z0000 Encounter for general adult medical examination without abnormal findings: Secondary | ICD-10-CM | POA: Diagnosis not present

## 2022-08-04 DIAGNOSIS — E1140 Type 2 diabetes mellitus with diabetic neuropathy, unspecified: Secondary | ICD-10-CM | POA: Diagnosis not present

## 2022-08-04 DIAGNOSIS — R7401 Elevation of levels of liver transaminase levels: Secondary | ICD-10-CM | POA: Diagnosis not present

## 2022-08-04 DIAGNOSIS — I700 Atherosclerosis of aorta: Secondary | ICD-10-CM | POA: Diagnosis not present

## 2022-08-09 ENCOUNTER — Other Ambulatory Visit: Payer: Self-pay | Admitting: Family Medicine

## 2022-08-09 DIAGNOSIS — Z1231 Encounter for screening mammogram for malignant neoplasm of breast: Secondary | ICD-10-CM

## 2022-08-17 DIAGNOSIS — F332 Major depressive disorder, recurrent severe without psychotic features: Secondary | ICD-10-CM | POA: Diagnosis not present

## 2022-08-30 DIAGNOSIS — F332 Major depressive disorder, recurrent severe without psychotic features: Secondary | ICD-10-CM | POA: Diagnosis not present

## 2022-09-04 ENCOUNTER — Encounter: Payer: Self-pay | Admitting: Physician Assistant

## 2022-09-04 DIAGNOSIS — M5441 Lumbago with sciatica, right side: Secondary | ICD-10-CM | POA: Diagnosis not present

## 2022-09-04 DIAGNOSIS — M5442 Lumbago with sciatica, left side: Secondary | ICD-10-CM | POA: Diagnosis not present

## 2022-09-14 DIAGNOSIS — E559 Vitamin D deficiency, unspecified: Secondary | ICD-10-CM | POA: Diagnosis not present

## 2022-09-14 DIAGNOSIS — E1165 Type 2 diabetes mellitus with hyperglycemia: Secondary | ICD-10-CM | POA: Diagnosis not present

## 2022-09-14 DIAGNOSIS — E7800 Pure hypercholesterolemia, unspecified: Secondary | ICD-10-CM | POA: Diagnosis not present

## 2022-09-14 DIAGNOSIS — I10 Essential (primary) hypertension: Secondary | ICD-10-CM | POA: Diagnosis not present

## 2022-09-19 DIAGNOSIS — M5416 Radiculopathy, lumbar region: Secondary | ICD-10-CM | POA: Diagnosis not present

## 2022-09-19 DIAGNOSIS — M5459 Other low back pain: Secondary | ICD-10-CM | POA: Diagnosis not present

## 2022-09-22 NOTE — Progress Notes (Unsigned)
09/25/2022 Amy [name] [id number] 11/28/1955  Referring provider: Harlan Stains, MD Primary GI doctor: Dr. 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 Must exclude other chronic causes of hepatocellular inflammation that can mimic fatty liver especially since she is having pruritus for past 6-12 months. - Labs to include: hepatitis panel, iron, ferritin, TIBC,  IgG, ANA, Antismooth muscle antibody, celiac,  alpha-one-antitrypsin level --Continue to work on risk factor modification including diet exercise and control of risk factors including blood sugars.  Type 2 diabetes mellitus with chronic kidney disease, with long-term current use of insulin, unspecified CKD stage ([place]) On Mounjaro, continue weight loss  OBESITY, MORBID Continue weight loss  Diarrhea, unspecified type Colon 2021 due to chronic diarrhea, some improvement with SIBO ,continuing diarrhea, will check celiac and pancreatic elastase.  If negative consider repeat SIBO treatment.   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 female referred by Harlan Stains, MD, with a past medical history insulin-dependent diabetes with diabetic neuropathy, hyperlipidemia, hypertension, GERD and others listed below presents for evaluation of elevated liver functions.   12/22/2019 colonoscopy for chronic diarrhea negative biopsies, empirically treated for SIBO with metronidazole '250mg'$  TID for 10 days and cephalexin '250mg'$  TID for 10 days with improvement 01/2020  ordered but never done celiac and fecal elastase 06/03/2020 EGD for chronic epigastric discomfort and IDA found to have extensive proximal gastric compression from a lap band device placed in 2007 resultant severe ulcerative changes at the EG junction/gastric cardia status post IV iron. Dr. Kaylyn Lim of [facility] deflated device  Patient went to the ER 06/17/2022 with pleuritic chest pain, negative troponin no EKG changes found to have elevated LFTs.  She body mass index is 27.09 kg/m.Marland Kitchen      Latest Ref Rng & Units 06/17/2022   10:32 AM 06/21/2021    2:05 AM 07/07/2019    8:58 AM  Hepatic Function  Total Protein 6.5 - 8.1 g/dL 7.4  6.6  6.8   Albumin 3.5 - 5.0 g/dL 4.4  3.9  3.9   AST 15 - 41 U/L 67  38  73   ALT 0 - 44 U/L 91  76  66   Alk Phosphatase 38 - 126 U/L 167  108  108   Total Bilirubin 0.3 - 1.2 mg/dL 0.6  0.5  0.7     03/20/2019 patient had right upper quadrant ultrasound for elevated LFTs which showed normal gallbladder normal CBD, mild coarse heterogeneous parenchymal echogenicity without focal mass. 06/21/2021 CT abdomen pelvis with contrast mildly dilated small bowel loops may represent segmental ileus developing obstruction secondary to lesion is not excluded, mild fatty liver, aortic atherosclerosis. FIB4 is 2.14   She denies tattoos, high risk sexual behavior, IV drug use, and blood transfusion history She denies ETOH use. Patient is not a smoker.  Admits to tylenol minimal tylenol use, less than 3 x a week. , Denies NSAID use. , Denies OTC supplement use. , and Denies recent ABX use.  She is on a statin, on Vytorin, has been on it for several  years.  Only new medication is Mounjaro for last 2 months.  The patient denies issues with jaundice, scleral icterus, dark urine, clay colored stool.  She denies Abdominal distention.Admits to LE edema.  States she does have generalized pruritus, worse with going to bed, has had for 6 months to a year.  She denies confusion  recently but does admit to some poor short term memory.  Has some lower left flank pain, worse with sitting to standing.  Denies fever, chills.  denies  family history of liver disease.  Patient continues to have diarrhea. Denies melena, hematochezia.  States worse after any food/drink.  Patient denies GERd, dysphagia, nausea or vomiting.   External labs and notes reviewed this visit:  06/17/2022  HGB 12.9 MCV 95.4 without evidence of anemia WBC 6.3 Platelets 210 07/27/2020  Iron 83 Ferritin 192.6  06/17/2022  AST 67 ALT 91 Alkphos 167 TBili 0.6   She  reports that she has never smoked. She has never used smokeless tobacco. She reports that she does not drink alcohol and does not use drugs.  Current Medications:   Current Outpatient Medications (Endocrine & Metabolic):    Insulin Glargine (BASAGLAR KWIKPEN) 100 UNIT/ML, Inject 26 Units into the skin at bedtime.   metFORMIN (GLUCOPHAGE-XR) 500 MG 24 hr tablet, Take 1,000 mg by mouth 2 (two) times daily.   MOUNJARO 2.5 MG/0.5ML Pen, Inject 2.5 mg into the skin once a week.   NOVOLOG FLEXPEN 100 UNIT/ML FlexPen, Inject 4-10 Units into the skin 3 (three) times daily.   Current Outpatient Medications (Cardiovascular):    amLODipine (NORVASC) 2.5 MG tablet, Take 2.5 mg by mouth daily.   ezetimibe-simvastatin (VYTORIN) 10-20 MG tablet, Take 1 tablet by mouth daily.   furosemide (LASIX) 40 MG tablet, Take 40 mg by mouth daily.   telmisartan (MICARDIS) 80 MG tablet, Take 80 mg by mouth daily.   Current Outpatient Medications (Respiratory):    cetirizine (ZYRTEC) 10 MG tablet, Take 10 mg by mouth daily.   triamcinolone (NASACORT) 55 MCG/ACT AERO nasal inhaler, Place 2 sprays into the nose daily.   Current Outpatient Medications (Analgesics):    Acetaminophen (TYLENOL PO), Take 500 mg by mouth as needed.   HYDROcodone-acetaminophen (NORCO/VICODIN) 5-325 MG tablet, Take 1 tablet by mouth daily as needed for pain.   Current  Outpatient Medications (Hematological):    FERREX 150 150 MG capsule, Take 150 mg by mouth daily.   Current Outpatient Medications (Other):    amphetamine-dextroamphetamine (ADDERALL XR) 25 MG 24 hr capsule, Take 25 mg by mouth every morning.   ARIPiprazole (ABILIFY) 5 MG tablet, Take 5 mg by mouth at bedtime.   Armodafinil 250 MG tablet, Take 250 mg by mouth every morning.   buPROPion (WELLBUTRIN XL) 150 MG 24 hr tablet, Take 150 mg by mouth every morning.   ergocalciferol (VITAMIN D2) 50000 UNITS capsule, Take 50,000 Units by mouth every Monday.   gabapentin (NEURONTIN) 600 MG tablet, Take 600 mg by mouth at bedtime.   hydroquinone 4 % cream, Apply 1 Application topically at bedtime.   Insulin Pen Needle (FIFTY50 PEN NEEDLES) 31G X 5 MM MISC, BD Ultra-Fine Mini Pen Needle 31 gauge x 3/16"   KLOR-CON M20 20 MEQ tablet, Take 40 mEq by mouth daily.    L-Methylfolate-Algae (L-METHYLFOLATE FORTE) 15-90.314 MG CAPS, Take 1 tablet by mouth at bedtime.   lithium carbonate (ESKALITH) 450 MG CR tablet, Take 900 mg by mouth at bedtime.   omeprazole (PRILOSEC) 40 MG capsule, TAKE 1  CAPSULE BY MOUTH TWICE A DAY (Patient taking differently: Take 40 mg by mouth in the morning and at bedtime.)   SPRAVATO, 84 MG DOSE, 28 MG/DEVICE SOPK, Place 3 sprays into both nostrils once a week. On Wednesday   traZODone (DESYREL) 50 MG tablet, Take 200 mg by mouth at bedtime. 4 tablets at night   triamcinolone cream (KENALOG) 0.1 %, Apply 1 Application topically daily.   triamcinolone cream (KENALOG) 0.1 %, Apply 1 Application topically 2 (two) times daily.   TRINTELLIX 20 MG TABS tablet, Take 20 mg by mouth daily.   zaleplon (SONATA) 10 MG capsule, Take 20 mg by mouth at bedtime.   Facility-Administered Medications Ordered in Other Visits (Other):    gadopentetate dimeglumine (MAGNEVIST) injection 14 mL No current facility-administered medications for this visit.  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 x 20 yrs.   IDA (iron deficiency anemia)    Insulin dependent diabetes mellitus    Major depressive disorder    Osteoarthritis    knees   Seborrheic dermatitis of scalp    SLEEP APNEA    uses CPAP nightly   Vitamin D deficiency    Allergies:  Allergies  Allergen Reactions   Canagliflozin     Other reaction(s): yeast infection   Dulaglutide     Other reaction(s): diarhea/stool incontinenece   Ramipril     Other reaction(s): cough     Surgical History:  She  has a past surgical history that includes Laparoscopic gastric banding (12/05/2005); Esophagogastroduodenoscopy (02/03/2004); Colonoscopy with propofol (10/27/2013); Breast reduction surgery (Bilateral); and Carpal tunnel release (Right, 10/08/2014). Family History:  Her family history includes Breast cancer in her sister; Diabetes in her sister; Heart attack in her father; Heart disease in her father; Heart failure in her mother; Prostate cancer in her brother; Sarcoidosis in her brother.  REVIEW OF SYSTEMS  : All other systems reviewed and negative except where noted in the History of Present Illness.  PHYSICAL EXAM: BP 110/62   Pulse 74   Ht '5\' 4"'$  (1.626 m)   Wt 157 lb 12.8 oz (71.6 kg)   BMI 27.09 kg/m  General :  Alert, well developed female in no acute distress Head:  Normocephalic and atraumatic. Eyes :  sclerae anicteric,conjunctive pink  Heart:  regular rate and rhythm Pulm:  Clear anteriorly; no wheezing Abdomen:   Soft, Obese AB, skin exam normal, Normal bowel sounds. No tenderness . , without hepatomegaly.  no  fluid wave, no  shifting dullness.  Extremities:   Without edema. Msk:  Symmetrical without gross deformities. Peripheral pulses intact.  Neurologic: Alert and  oriented x4;  grossly normal neurologically. without asterixis or clonus.  Skin:   without jaundice.  Psychiatric:  Demonstrates good judgement and reason without abnormal affect or behaviors.   RELEVANT LABS AND IMAGING: CBC    Component Value Date/Time   WBC 6.3 06/17/2022 1032   RBC 4.33 06/17/2022 1032   HGB 12.9 06/17/2022 1032  HGB 13.5 02/26/2019 1547   HCT 41.3 06/17/2022 1032   HCT 40.6 02/26/2019 1547   PLT 210 06/17/2022 1032   PLT 197 02/26/2019 1547   MCV 95.4 06/17/2022 1032   MCV 90 02/26/2019 1547   MCH 29.8 06/17/2022 1032   MCHC 31.2 06/17/2022 1032   RDW 12.7 06/17/2022 1032   RDW 12.5 02/26/2019 1547   LYMPHSABS 1.2 06/17/2022 1032   MONOABS 0.5 06/17/2022 1032   EOSABS 0.2 06/17/2022 1032   BASOSABS 0.0 06/17/2022 1032    CMP     Component Value Date/Time   NA 137 06/17/2022 1032   NA 139 02/26/2019 1547   K 3.0 (L) 06/17/2022 1032   CL 101 06/17/2022 1032   CO2 29 06/17/2022 1032   GLUCOSE 165 (H) 06/17/2022 1032   BUN 7 (L) 06/17/2022 1032   BUN 6 (L) 02/26/2019 1547   CREATININE 0.88 06/17/2022 1032   CALCIUM 8.9 06/17/2022 1032   CALCIUM 9.2 01/18/2009 2305   PROT 7.4 06/17/2022 1032   PROT 6.3 02/26/2019 1547   ALBUMIN 4.4 06/17/2022 1032   ALBUMIN 4.3 02/26/2019 1547   AST 67 (H) 06/17/2022 1032   ALT 91 (H) 06/17/2022 1032   ALKPHOS 167 (H) 06/17/2022 1032   BILITOT 0.6 06/17/2022 1032   BILITOT 0.4 02/26/2019 1547   GFRNONAA >60 06/17/2022 1032   GFRAA >60 07/07/2019 [place] Lauris Keepers, PA-C 9:35 AM

## 2022-09-25 ENCOUNTER — Encounter: Payer: Self-pay | Admitting: Physician Assistant

## 2022-09-25 ENCOUNTER — Other Ambulatory Visit (INDEPENDENT_AMBULATORY_CARE_PROVIDER_SITE_OTHER): Payer: Medicare Other

## 2022-09-25 ENCOUNTER — Ambulatory Visit (INDEPENDENT_AMBULATORY_CARE_PROVIDER_SITE_OTHER): Payer: Medicare Other | Admitting: Physician Assistant

## 2022-09-25 VITALS — BP 110/62 | HR 74 | Ht 64.0 in | Wt 157.8 lb

## 2022-09-25 DIAGNOSIS — E1122 Type 2 diabetes mellitus with diabetic chronic kidney disease: Secondary | ICD-10-CM

## 2022-09-25 DIAGNOSIS — R7989 Other specified abnormal findings of blood chemistry: Secondary | ICD-10-CM

## 2022-09-25 DIAGNOSIS — R197 Diarrhea, unspecified: Secondary | ICD-10-CM | POA: Diagnosis not present

## 2022-09-25 DIAGNOSIS — Z794 Long term (current) use of insulin: Secondary | ICD-10-CM

## 2022-09-25 DIAGNOSIS — L299 Pruritus, unspecified: Secondary | ICD-10-CM

## 2022-09-25 LAB — BASIC METABOLIC PANEL
BUN: 6 mg/dL (ref 6–23)
CO2: 29 mEq/L (ref 19–32)
Calcium: 9.2 mg/dL (ref 8.4–10.5)
Chloride: 103 mEq/L (ref 96–112)
Creatinine, Ser: 1 mg/dL (ref 0.40–1.20)
GFR: 59.38 mL/min — ABNORMAL LOW (ref >60.00)
Glucose, Bld: 111 mg/dL — ABNORMAL HIGH (ref 70–99)
Potassium: 3.9 mEq/L (ref 3.5–5.1)
Sodium: 139 mEq/L (ref 135–145)

## 2022-09-25 LAB — CBC WITH DIFFERENTIAL/PLATELET
Basophils Absolute: 0.1 10*3/uL (ref 0.0–0.1)
Basophils Relative: 0.9 % (ref 0.0–3.0)
Eosinophils Absolute: 0.2 10*3/uL (ref 0.0–0.7)
Eosinophils Relative: 3.2 % (ref 0.0–5.0)
HCT: 38.5 % (ref 36.0–46.0)
Hemoglobin: 12.9 g/dL (ref 12.0–15.0)
Lymphocytes Relative: 22.7 % (ref 12.0–46.0)
Lymphs Abs: 1.3 10*3/uL (ref 0.7–4.0)
MCHC: 33.4 g/dL (ref 30.0–36.0)
MCV: 91.9 fl (ref 78.0–100.0)
Monocytes Absolute: 0.4 10*3/uL (ref 0.1–1.0)
Monocytes Relative: 6.6 % (ref 3.0–12.0)
Neutro Abs: 3.9 10*3/uL (ref 1.4–7.7)
Neutrophils Relative %: 66.6 % (ref 43.0–77.0)
Platelets: 177 10*3/uL (ref 150.0–400.0)
RBC: 4.19 Mil/uL (ref 3.87–5.11)
RDW: 13.9 % (ref 11.5–15.5)
WBC: 5.9 10*3/uL (ref 4.0–10.5)

## 2022-09-25 LAB — IBC + FERRITIN
Ferritin: 176.4 ng/mL (ref 10.0–291.0)
Iron: 93 ug/dL (ref 42–145)
Saturation Ratios: 32.1 % (ref 20.0–50.0)
TIBC: 289.8 ug/dL (ref 250.0–450.0)
Transferrin: 207 mg/dL — ABNORMAL LOW (ref 212.0–360.0)

## 2022-09-25 LAB — HEPATIC FUNCTION PANEL
ALT: 171 U/L — ABNORMAL HIGH (ref 0–35)
AST: 95 U/L — ABNORMAL HIGH (ref 0–37)
Albumin: 4 g/dL (ref 3.5–5.2)
Alkaline Phosphatase: 203 U/L — ABNORMAL HIGH (ref 39–117)
Bilirubin, Direct: 0.1 mg/dL (ref 0.0–0.3)
Total Bilirubin: 0.4 mg/dL (ref 0.2–1.2)
Total Protein: 6.5 g/dL (ref 6.0–8.3)

## 2022-09-25 LAB — PROTIME-INR
INR: 1.1 ratio — ABNORMAL HIGH (ref 0.8–1.0)
Prothrombin Time: 12.1 s (ref 9.6–13.1)

## 2022-09-25 NOTE — Patient Instructions (Signed)
You have been scheduled for an abdominal ultrasound with elastography at [facility] (1st floor). Your appointment is scheduled on 10/02/22 at 9:30 AM. Please arrive 30 minutes prior to your scheduled appointment for registration purposes. Make certain not to have anything to eat or drink 8 hours prior to your procedure. Should you need to reschedule your appointment, you may contact radiology at [phone].  Liver Elastography Various chronic liver diseases such as hepatitis B, C, and fatty liver disease can lead to tissue damage and subsequent scar tissue formation. As the scar tissue accumulates, the liver loses some of its elasticity and becomes stiffer. Liver elastography involves the use of a surface ultrasound probe that delivers a low frequency pulse or shear wave to a small volume of liver tissue under the rib cage. The transmission of the sound wave is completely painless. How Is a Liver Elastography Performed? The liver is located in the right upper abdomen under the rib cage. Patients are asked to lie flat on an examination table. A technician places the FibroScan probe between the ribs on the right side of the lower chest wall. A series of 10 painless pulses are then applied to the liver. The results are recorded on the equipment and an overall liver stiffness score is generated. This score is then interpreted by a qualified physician to predict the likelihood of advanced fibrosis or cirrhosis.  Patients are asked to wear loose clothing and should not consume any liquids or solids for a minimum of 4 hours before the test to increase the likelihood of obtaining reliable test results. The scan will take 10 to 15 minutes to complete, but patients should plan on being available for 30 minutes to allow time for preparation    Your provider has requested that you go to the basement level for lab work before leaving today. Press "B" on the elevator. The lab is located at the first  door on the left as you exit the elevator.   Metabolic dysfunction associated seatohepatitis  Now the leading cause of liver failure in the [place].  It is normally from such risk factors as obesity, diabetes, insulin resistance, high cholesterol, or metabolic syndrome.  The only definitive therapy is weight loss and exercise.   Suggest walking 20-30 mins daily.  Decreasing carbohydrates, increasing veggies.    Fatty Liver Fatty liver is the accumulation of fat in liver cells. It is also called hepatosteatosis or steatohepatitis. It is normal for your liver to contain some fat. If fat is more than 5 to 10% of your liver's weight, you have fatty liver.  There are often no symptoms (problems) for years while damage is still occurring. People often learn about their fatty liver when they have medical tests for other reasons. Fat can damage your liver for years or even decades without causing problems. When it becomes severe, it can cause fatigue, weight loss, weakness, and confusion. This makes you more likely to develop more serious liver problems. The liver is the largest organ in the body. It does a lot of work and often gives no warning signs when it is sick until late in a disease. The liver has many important jobs including: Breaking down foods. Storing vitamins, iron, and other minerals. Making proteins. Making bile for food digestion. Breaking down many products including medications, alcohol and some poisons.  PROGNOSIS  Fatty liver may cause no damage or it can lead to an inflammation of the liver. This is, called steatohepatitis.  Over  time the liver may become scarred and hardened. This condition is called cirrhosis. Cirrhosis is serious and may lead to liver failure or cancer. NASH is one of the leading causes of cirrhosis. About 10-20% of Americans have fatty liver and a smaller 2-5% has NASH.  TREATMENT  Weight loss, fat restriction, and exercise in overweight patients  produces inconsistent results but is worth trying. Good control of diabetes may reduce fatty liver. Eat a balanced, healthy diet. Increase your physical activity. There are no medical or surgical treatments for a fatty liver or NASH, but improving your diet and increasing your exercise may help prevent or reverse some of the damage.    We can refer you to the weight loss clinic if you would like or you can also talk with your primary care about medical options for weight loss.   Thank you for choosing me and [facility].  Vicie Mutters, PA.

## 2022-09-25 NOTE — Progress Notes (Signed)
____________________________________________________________  Attending physician addendum:  Thank you for sending this case to me. I have reviewed the entire note and agree with the plan.  Additional thoughts:   - Bilirubin is normal, so her pruritus is not from a hepatic cause   - Transaminitis for several years , risk factors and prior imaging reports suggest MASH as you pointed out. Alk Phos is now elevated, so please be sure that an Anti-Mitochondrial Antibody was also ordered  Wilfrid Lund, MD  ____________________________________________________________

## 2022-09-26 DIAGNOSIS — M5459 Other low back pain: Secondary | ICD-10-CM | POA: Diagnosis not present

## 2022-09-26 DIAGNOSIS — M5416 Radiculopathy, lumbar region: Secondary | ICD-10-CM | POA: Diagnosis not present

## 2022-09-28 ENCOUNTER — Other Ambulatory Visit: Payer: Federal, State, Local not specified - PPO

## 2022-09-28 DIAGNOSIS — M5459 Other low back pain: Secondary | ICD-10-CM | POA: Diagnosis not present

## 2022-09-28 DIAGNOSIS — R197 Diarrhea, unspecified: Secondary | ICD-10-CM

## 2022-09-28 DIAGNOSIS — M5416 Radiculopathy, lumbar region: Secondary | ICD-10-CM | POA: Diagnosis not present

## 2022-09-28 LAB — CERULOPLASMIN: Ceruloplasmin: 23 mg/dL (ref 18–53)

## 2022-09-28 LAB — ANTI-NUCLEAR AB-TITER (ANA TITER)
ANA TITER: 1:640 {titer} — ABNORMAL HIGH
ANA Titer 1: 1:320 {titer} — ABNORMAL HIGH

## 2022-09-28 LAB — HEPATITIS C ANTIBODY: Hepatitis C Ab: NONREACTIVE

## 2022-09-28 LAB — HEPATITIS B CORE ANTIBODY, TOTAL: Hep B Core Total Ab: NONREACTIVE

## 2022-09-28 LAB — HEPATITIS B SURFACE ANTIGEN: Hepatitis B Surface Ag: NONREACTIVE

## 2022-09-28 LAB — ALPHA-1-ANTITRYPSIN: A-1 Antitrypsin, Ser: 115 mg/dL (ref 83–199)

## 2022-09-28 LAB — IGA: Immunoglobulin A: 223 mg/dL (ref 70–320)

## 2022-09-28 LAB — ANTI-SMOOTH MUSCLE ANTIBODY, IGG: Actin (Smooth Muscle) Antibody (IGG): <20 U (ref <20)

## 2022-09-28 LAB — HEPATITIS A ANTIBODY, TOTAL: Hepatitis A AB,Total: REACTIVE — AB

## 2022-09-28 LAB — ANA: Anti Nuclear Antibody (ANA): POSITIVE — AB

## 2022-09-28 LAB — MITOCHONDRIAL ANTIBODIES: Mitochondrial M2 Ab, IgG: <=20 U (ref <=20.0)

## 2022-09-28 LAB — HEPATITIS B SURFACE ANTIBODY,QUALITATIVE: Hep B S Ab: REACTIVE — AB

## 2022-09-28 LAB — IGG: IgG (Immunoglobin G), Serum: 941 mg/dL (ref 600–1540)

## 2022-09-28 LAB — HEPATITIS A ANTIBODY, IGM: Hep A IgM: NONREACTIVE

## 2022-09-28 LAB — TISSUE TRANSGLUTAMINASE, IGA: (tTG) Ab, IgA: <1 U/mL

## 2022-09-29 ENCOUNTER — Other Ambulatory Visit: Payer: Self-pay | Admitting: Gastroenterology

## 2022-10-02 ENCOUNTER — Ambulatory Visit (HOSPITAL_COMMUNITY)
Admission: RE | Admit: 2022-10-02 | Discharge: 2022-10-02 | Disposition: A | Discharge status: Home / Self Care | Payer: Medicare Other | Source: Ambulatory Visit | Attending: Physician Assistant | Admitting: Physician Assistant

## 2022-10-02 DIAGNOSIS — R7989 Other specified abnormal findings of blood chemistry: Secondary | ICD-10-CM | POA: Diagnosis not present

## 2022-10-02 DIAGNOSIS — R945 Abnormal results of liver function studies: Secondary | ICD-10-CM | POA: Diagnosis not present

## 2022-10-03 DIAGNOSIS — M5416 Radiculopathy, lumbar region: Secondary | ICD-10-CM | POA: Diagnosis not present

## 2022-10-03 DIAGNOSIS — M5459 Other low back pain: Secondary | ICD-10-CM | POA: Diagnosis not present

## 2022-10-04 LAB — FECAL FAT, QUALITATIVE
Fat Qual Neutral, Stl: NORMAL
Fat Qual Total, Stl: NORMAL

## 2022-10-06 ENCOUNTER — Other Ambulatory Visit: Payer: Self-pay

## 2022-10-06 LAB — PANCREATIC ELASTASE, FECAL: Pancreatic Elastase-1, Stool: 42 mcg/g — ABNORMAL LOW

## 2022-10-06 MED ORDER — PANCRELIPASE (LIP-PROT-AMYL) 36000-114000 UNITS PO CPEP: ORAL_CAPSULE | ORAL | 0 refills | Status: DC

## 2022-10-10 DIAGNOSIS — M5416 Radiculopathy, lumbar region: Secondary | ICD-10-CM | POA: Diagnosis not present

## 2022-10-10 DIAGNOSIS — M5459 Other low back pain: Secondary | ICD-10-CM | POA: Diagnosis not present

## 2022-10-11 DIAGNOSIS — F332 Major depressive disorder, recurrent severe without psychotic features: Secondary | ICD-10-CM | POA: Diagnosis not present

## 2022-10-12 DIAGNOSIS — E1140 Type 2 diabetes mellitus with diabetic neuropathy, unspecified: Secondary | ICD-10-CM | POA: Diagnosis not present

## 2022-10-12 DIAGNOSIS — E559 Vitamin D deficiency, unspecified: Secondary | ICD-10-CM | POA: Diagnosis not present

## 2022-10-12 DIAGNOSIS — E7800 Pure hypercholesterolemia, unspecified: Secondary | ICD-10-CM | POA: Diagnosis not present

## 2022-10-12 DIAGNOSIS — E1165 Type 2 diabetes mellitus with hyperglycemia: Secondary | ICD-10-CM | POA: Diagnosis not present

## 2022-10-17 DIAGNOSIS — M5459 Other low back pain: Secondary | ICD-10-CM | POA: Diagnosis not present

## 2022-10-17 DIAGNOSIS — M5416 Radiculopathy, lumbar region: Secondary | ICD-10-CM | POA: Diagnosis not present

## 2022-10-19 ENCOUNTER — Ambulatory Visit
Admission: RE | Admit: 2022-10-19 | Discharge: 2022-10-19 | Disposition: A | Discharge status: Home / Self Care | Payer: Medicare Other | Source: Ambulatory Visit | Attending: Family Medicine | Admitting: Family Medicine

## 2022-10-19 DIAGNOSIS — Z1231 Encounter for screening mammogram for malignant neoplasm of breast: Secondary | ICD-10-CM | POA: Diagnosis not present

## 2022-10-24 ENCOUNTER — Other Ambulatory Visit: Payer: Self-pay | Admitting: Family Medicine

## 2022-10-24 DIAGNOSIS — M5416 Radiculopathy, lumbar region: Secondary | ICD-10-CM | POA: Diagnosis not present

## 2022-10-24 DIAGNOSIS — M5459 Other low back pain: Secondary | ICD-10-CM | POA: Diagnosis not present

## 2022-10-24 DIAGNOSIS — R928 Other abnormal and inconclusive findings on diagnostic imaging of breast: Secondary | ICD-10-CM

## 2022-10-25 DIAGNOSIS — F332 Major depressive disorder, recurrent severe without psychotic features: Secondary | ICD-10-CM | POA: Diagnosis not present

## 2022-10-26 ENCOUNTER — Other Ambulatory Visit: Payer: Self-pay | Admitting: Physician Assistant

## 2022-10-29 NOTE — Progress Notes (Unsigned)
10/30/2022 Amy [name] [id number] July 07, 1956  Referring provider: Harlan Stains, MD Primary GI doctor: Dr. 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 -No alcohol, avoid Tylenol -Still had elevated LFTs we will recheck at this visit, monitor every 6 months, consider abdominal imaging yearly.  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  Patient Care Team: Harlan Stains, MD as PCP - General (Family Medicine) Johnathan Hausen, MD as Consulting Physician (General Surgery)  HISTORY OF PRESENT ILLNESS: 67 y.o. female referred by Harlan Stains, MD, with a past medical history insulin-dependent diabetes with diabetic neuropathy, hyperlipidemia, hypertension, GERD and others listed below presents for evaluation of elevated liver functions.   12/22/2019 colonoscopy for chronic diarrhea negative biopsies, empirically treated  for SIBO with metronidazole '250mg'$  TID for 10 days and cephalexin '250mg'$  TID for 10 days with improvement 01/2020 ordered but never done celiac and fecal elastase 06/03/2020 EGD for chronic epigastric discomfort and IDA found to have extensive proximal gastric compression from a lap band device placed in 2007 resultant severe ulcerative changes at the EG junction/gastric cardia status post IV iron. Dr. Kaylyn Lim of [facility] deflated device  She body mass index is 28.32 kg/m.Marland Kitchen      Latest Ref Rng & Units 09/25/2022    9:50 AM 06/17/2022   10:32 AM 06/21/2021    2:05 AM  Hepatic Function  Total Protein 6.0 - 8.3 g/dL 6.5  7.4  6.6   Albumin 3.5 - 5.2 g/dL 4.0  4.4  3.9   AST 0 - 37 U/L 95  67  38   ALT 0 - 35 U/L 171  91  76   Alk Phosphatase 39 - 117 U/L 203  167  108   Total Bilirubin 0.2 - 1.2 mg/dL 0.4  0.6  0.5   Bilirubin, Direct 0.0 - 0.3 mg/dL 0.1       03/20/2019 patient had right upper quadrant ultrasound for elevated LFTs which showed normal gallbladder normal CBD, mild coarse heterogeneous parenchymal echogenicity without focal mass. 06/21/2021 CT abdomen pelvis with contrast mildly dilated small bowel loops may represent segmental ileus developing obstruction secondary to lesion is not excluded, mild fatty liver, aortic atherosclerosis. FIB4 is 2.14   09/25/2022 patient seen the office for elevated liver function. Had negative celiac, IgG, hepatitis panel, alpha-1 antitrypsin, ceruloplasmin, mitochondrial antibody and anti-smooth muscle antibody. Patient had immunity to hep A and hep B. 10/02/2022 ultrasound  elastography showed suspected fatty infiltration liver no additional hepatic sonographic abnormalities median K PA  4.2 high probability of being normal.  ANA positive with significant titer discussed following up with primary care versus rheumatology referral. She has dry mouth and dry eyes, has neuropathy hands and feet, denies joint pain.  She has rash on her back, denies  rash on face. Denies raynaud's history.   No family history of lupus but she would like to see a rheumatologist. She denies ETOH use. Patient is not a smoker.  She takes tylenol every other day, she is denies NSAIDS, recent ABX, OTC supplements. She is on a statin, on Vytorin, has been on it for several years.  Only new medication is Mounjaro for last 2 months, recently increased to 7.5 mg, starts on Monday.  The patient denies issues with jaundice, scleral icterus, dark urine, clay colored stool.  She denies Abdominal distention.Admits to LE edema.   Patient given trial of Creon for pancreatic elastase of 42. She did trial of creon without any help but she was not taking it before food.  Patient continues to have diarrhea, after each meal.  Denies melena, hematochezia.  Patient denies GERd, dysphagia, nausea or vomiting.    She  reports that she has never smoked. She has never used smokeless tobacco. She reports that she does not drink alcohol and does not use drugs.  Current Medications:   Current Outpatient Medications (Endocrine & Metabolic):    Insulin Glargine (BASAGLAR KWIKPEN) 100 UNIT/ML, Inject 26 Units into the skin at bedtime.   metFORMIN (GLUCOPHAGE-XR) 500 MG 24 hr tablet, Take 1,000 mg by mouth 2 (two) times daily.   MOUNJARO 2.5 MG/0.5ML Pen, Inject 2.5 mg into the skin once a week.   NOVOLOG FLEXPEN 100 UNIT/ML FlexPen, Inject 4-10 Units into the skin 3 (three) times daily.   Current Outpatient Medications (Cardiovascular):    amLODipine (NORVASC) 2.5 MG tablet, Take 2.5 mg by mouth daily.   ezetimibe-simvastatin (VYTORIN) 10-20 MG tablet, Take 1 tablet by mouth daily.   furosemide (LASIX) 40 MG tablet, Take 40 mg by mouth daily.   telmisartan (MICARDIS) 80 MG tablet, Take 80 mg by mouth daily.   Current Outpatient Medications (Respiratory):    cetirizine (ZYRTEC) 10 MG tablet, Take 10 mg by mouth daily.   triamcinolone (NASACORT) 55 MCG/ACT AERO nasal inhaler,  Place 2 sprays into the nose daily.   Current Outpatient Medications (Analgesics):    Acetaminophen (TYLENOL PO), Take 500 mg by mouth as needed.   HYDROcodone-acetaminophen (NORCO/VICODIN) 5-325 MG tablet, Take 1 tablet by mouth daily as needed for pain.   Current Outpatient Medications (Hematological):    FERREX 150 150 MG capsule, Take 150 mg by mouth daily.   Current Outpatient Medications (Other):    amphetamine-dextroamphetamine (ADDERALL XR) 25 MG 24 hr capsule, Take 25 mg by mouth every morning.   ARIPiprazole (ABILIFY) 5 MG tablet, Take 5 mg by mouth at bedtime.   Armodafinil 250 MG tablet, Take 250 mg by mouth every morning.   buPROPion (WELLBUTRIN XL) 150 MG 24 hr tablet, Take 150 mg by mouth every morning.   CREON 36000-114000 units CPEP capsule, TAKE 2 CAPSULES 3 TIMES DAILY WITH MEALS. MAY ALSO TAKE 1 CAPSULE AS NEEDED (WITH SNACKS).   ergocalciferol (VITAMIN D2) 50000 UNITS capsule, Take 50,000 Units by mouth every Monday.   gabapentin (NEURONTIN) 600 MG tablet, Take 600 mg by mouth at bedtime.   hydroquinone 4 % cream, Apply 1 Application topically at bedtime.   Insulin Pen Needle (FIFTY50 PEN NEEDLES) 31G X 5 MM MISC, BD  Ultra-Fine Mini Pen Needle 31 gauge x 3/16"   KLOR-CON M20 20 MEQ tablet, Take 40 mEq by mouth daily.    L-Methylfolate-Algae (L-METHYLFOLATE FORTE) 15-90.314 MG CAPS, Take 1 tablet by mouth at bedtime.   lithium carbonate (ESKALITH) 450 MG CR tablet, Take 900 mg by mouth at bedtime.   omeprazole (PRILOSEC) 40 MG capsule, TAKE 1 CAPSULE BY MOUTH TWICE A DAY   SPRAVATO, 84 MG DOSE, 28 MG/DEVICE SOPK, Place 3 sprays into both nostrils once a week. On Wednesday   traZODone (DESYREL) 50 MG tablet, Take 200 mg by mouth at bedtime. 4 tablets at night   triamcinolone cream (KENALOG) 0.1 %, Apply 1 Application topically daily.   triamcinolone cream (KENALOG) 0.1 %, Apply 1 Application topically 2 (two) times daily.   TRINTELLIX 20 MG TABS tablet, Take 20 mg by  mouth daily.   zaleplon (SONATA) 10 MG capsule, Take 20 mg by mouth at bedtime.   Facility-Administered Medications Ordered in Other Visits (Other):    gadopentetate dimeglumine (MAGNEVIST) injection 14 mL No current facility-administered medications for this visit.  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 x 20 yrs.   IDA (iron deficiency anemia)    Insulin dependent diabetes mellitus    Major depressive disorder    Osteoarthritis    knees   Seborrheic dermatitis of scalp    SLEEP APNEA    uses CPAP nightly   Vitamin D deficiency    Allergies:  Allergies  Allergen Reactions   Canagliflozin     Other reaction(s): yeast infection   Dulaglutide     Other reaction(s): diarhea/stool incontinenece   Ramipril     Other reaction(s): cough     Surgical History:  She  has a past surgical history that includes Laparoscopic gastric banding (12/05/2005); Esophagogastroduodenoscopy (02/03/2004); Colonoscopy with propofol (10/27/2013); Breast reduction surgery (Bilateral); and Carpal tunnel release (Right, 10/08/2014). Family History:  Her family history includes Breast cancer in her sister; Diabetes in her sister; Heart attack in her father; Heart disease in her father; Heart failure in her mother; Prostate cancer in her brother; Sarcoidosis in her brother.  REVIEW OF SYSTEMS  : All other systems reviewed and negative except where noted in the History of Present Illness.  PHYSICAL EXAM: BP 132/72   Pulse 80   Ht '5\' 4"'$  (1.626 m)   Wt 165 lb (74.8 kg)   SpO2 98%   BMI 28.32 kg/m   General :  Alert, well developed female in no acute distress Head:  Normocephalic and atraumatic. Eyes :  sclerae anicteric,conjunctive pink  Heart:  regular rate and rhythm Pulm:  Clear anteriorly; no wheezing Abdomen:   Soft, Obese AB, skin exam normal, Normal bowel sounds. No tenderness . , without hepatomegaly. no  fluid wave, no  shifting dullness.  Extremities:   Without edema. Msk:  Symmetrical without gross deformities. Peripheral pulses intact.  Neurologic: Alert and  oriented x4;  grossly normal neurologically. without asterixis or clonus.  Skin:   without jaundice.  Psychiatric:  Demonstrates good judgement and reason without abnormal affect or behaviors.   RELEVANT LABS AND IMAGING: CBC    Component Value Date/Time   WBC 5.9 09/25/2022 0950   RBC 4.19 09/25/2022 0950   HGB 12.9 09/25/2022 0950   HGB 13.5 02/26/2019 1547   HCT 38.5 09/25/2022 0950   HCT 40.6 02/26/2019 1547   PLT 177.0 09/25/2022 0950   PLT 197 02/26/2019 1547   MCV 91.9 09/25/2022 0950   MCV 90 02/26/2019 1547   MCH 29.8 06/17/2022 1032   MCHC 33.4 09/25/2022 0950   RDW 13.9 09/25/2022 0950   RDW 12.5 02/26/2019 1547   LYMPHSABS 1.3 09/25/2022 0950   MONOABS 0.4 09/25/2022 0950   EOSABS 0.2 09/25/2022 0950   BASOSABS 0.1 09/25/2022 0950    CMP     Component Value Date/Time   NA 139 09/25/2022 0950   NA 139 02/26/2019 1547   K 3.9 09/25/2022 0950   CL 103 09/25/2022 0950   CO2 29 09/25/2022 0950   GLUCOSE 111 (H) 09/25/2022 0950   BUN 6 09/25/2022 0950   BUN 6 (L) 02/26/2019 1547   CREATININE 1.00 09/25/2022 0950   CALCIUM 9.2 09/25/2022 0950   CALCIUM 9.2 01/18/2009 2305   PROT 6.5 09/25/2022 0950   PROT 6.3 02/26/2019 1547   ALBUMIN 4.0 09/25/2022 0950   ALBUMIN 4.3 02/26/2019 1547   AST 95 (H) 09/25/2022 0950   ALT 171 (H) 09/25/2022 0950   ALKPHOS 203 (H) 09/25/2022 0950   BILITOT 0.4 09/25/2022 0950   BILITOT 0.4 02/26/2019 1547   GFRNONAA >60 06/17/2022 1032   GFRAA >60  07/07/2019 Amy Berda Shelvin, PA-C 9:10 AM

## 2022-10-30 ENCOUNTER — Encounter: Payer: Self-pay | Admitting: Physician Assistant

## 2022-10-30 ENCOUNTER — Other Ambulatory Visit (INDEPENDENT_AMBULATORY_CARE_PROVIDER_SITE_OTHER): Payer: Medicare Other

## 2022-10-30 ENCOUNTER — Ambulatory Visit (INDEPENDENT_AMBULATORY_CARE_PROVIDER_SITE_OTHER): Payer: Medicare Other | Admitting: Physician Assistant

## 2022-10-30 VITALS — BP 132/72 | HR 80 | Ht 64.0 in | Wt 165.0 lb

## 2022-10-30 DIAGNOSIS — R197 Diarrhea, unspecified: Secondary | ICD-10-CM | POA: Diagnosis not present

## 2022-10-30 DIAGNOSIS — R7989 Other specified abnormal findings of blood chemistry: Secondary | ICD-10-CM

## 2022-10-30 DIAGNOSIS — Z794 Long term (current) use of insulin: Secondary | ICD-10-CM

## 2022-10-30 DIAGNOSIS — E1122 Type 2 diabetes mellitus with diabetic chronic kidney disease: Secondary | ICD-10-CM | POA: Diagnosis not present

## 2022-10-30 DIAGNOSIS — R768 Other specified abnormal immunological findings in serum: Secondary | ICD-10-CM

## 2022-10-30 LAB — COMPREHENSIVE METABOLIC PANEL
ALT: 105 U/L — ABNORMAL HIGH (ref 0–35)
AST: 58 U/L — ABNORMAL HIGH (ref 0–37)
Albumin: 3.8 g/dL (ref 3.5–5.2)
Alkaline Phosphatase: 172 U/L — ABNORMAL HIGH (ref 39–117)
BUN: 13 mg/dL (ref 6–23)
CO2: 31 mEq/L (ref 19–32)
Calcium: 9.6 mg/dL (ref 8.4–10.5)
Chloride: 104 mEq/L (ref 96–112)
Creatinine, Ser: 1.03 mg/dL (ref 0.40–1.20)
GFR: 57.28 mL/min — ABNORMAL LOW (ref >60.00)
Glucose, Bld: 50 mg/dL — ABNORMAL LOW (ref 70–99)
Potassium: 4.5 mEq/L (ref 3.5–5.1)
Sodium: 140 mEq/L (ref 135–145)
Total Bilirubin: 0.4 mg/dL (ref 0.2–1.2)
Total Protein: 6.5 g/dL (ref 6.0–8.3)

## 2022-10-30 LAB — CBC WITH DIFFERENTIAL/PLATELET
Basophils Absolute: 0 10*3/uL (ref 0.0–0.1)
Basophils Relative: 0.8 % (ref 0.0–3.0)
Eosinophils Absolute: 0.2 10*3/uL (ref 0.0–0.7)
Eosinophils Relative: 2.9 % (ref 0.0–5.0)
HCT: 39.4 % (ref 36.0–46.0)
Hemoglobin: 12.8 g/dL (ref 12.0–15.0)
Lymphocytes Relative: 31.3 % (ref 12.0–46.0)
Lymphs Abs: 2 10*3/uL (ref 0.7–4.0)
MCHC: 32.6 g/dL (ref 30.0–36.0)
MCV: 93.8 fl (ref 78.0–100.0)
Monocytes Absolute: 0.5 10*3/uL (ref 0.1–1.0)
Monocytes Relative: 8.4 % (ref 3.0–12.0)
Neutro Abs: 3.5 10*3/uL (ref 1.4–7.7)
Neutrophils Relative %: 56.6 % (ref 43.0–77.0)
Platelets: 214 10*3/uL (ref 150.0–400.0)
RBC: 4.2 Mil/uL (ref 3.87–5.11)
RDW: 14 % (ref 11.5–15.5)
WBC: 6.3 10*3/uL (ref 4.0–10.5)

## 2022-10-30 MED ORDER — ZENPEP 40000-126000 UNITS PO CPEP: ORAL_CAPSULE | ORAL | 0 refills | Status: DC

## 2022-10-30 NOTE — Patient Instructions (Addendum)
Your provider has requested that you go to the basement level for lab work before leaving today. Press "B" on the elevator. The lab is located at the first door on the left as you exit the elevator.  Will refer to rheumatology for evaluation of your ANA  Try the samples 1 pill with snacks and 2 pills with food If this does not help we will treat you for SIBO or small intestinal overgrowth again.   Will recheck liver function but most likely this is fatty liver.   Metabolic dysfunction associated seatohepatitis  Now the leading cause of liver failure in the [place].  It is normally from such risk factors as obesity, diabetes, insulin resistance, high cholesterol, or metabolic syndrome.  The only definitive therapy is weight loss and exercise.   Suggest walking 20-30 mins daily.  Decreasing carbohydrates, increasing veggies.    Fatty Liver Fatty liver is the accumulation of fat in liver cells. It is also called hepatosteatosis or steatohepatitis. It is normal for your liver to contain some fat. If fat is more than 5 to 10% of your liver's weight, you have fatty liver.  There are often no symptoms (problems) for years while damage is still occurring. People often learn about their fatty liver when they have medical tests for other reasons. Fat can damage your liver for years or even decades without causing problems. When it becomes severe, it can cause fatigue, weight loss, weakness, and confusion. This makes you more likely to develop more serious liver problems. The liver is the largest organ in the body. It does a lot of work and often gives no warning signs when it is sick until late in a disease. The liver has many important jobs including: Breaking down foods. Storing vitamins, iron, and other minerals. Making proteins. Making bile for food digestion. Breaking down many products including medications, alcohol and some poisons.  PROGNOSIS  Fatty liver may cause no damage or it  can lead to an inflammation of the liver. This is, called steatohepatitis.  Over time the liver may become scarred and hardened. This condition is called cirrhosis. Cirrhosis is serious and may lead to liver failure or cancer. NASH is one of the leading causes of cirrhosis. About 10-20% of Americans have fatty liver and a smaller 2-5% has NASH.  TREATMENT  Weight loss, fat restriction, and exercise in overweight patients produces inconsistent results but is worth trying. Good control of diabetes may reduce fatty liver. Eat a balanced, healthy diet. Increase your physical activity. There are no medical or surgical treatments for a fatty liver or NASH, but improving your diet and increasing your exercise may help prevent or reverse some of the damage.  I appreciate the opportunity to care for you. Vicie Mutters, PA-C

## 2022-10-30 NOTE — Progress Notes (Signed)
____________________________________________________________  Attending physician addendum:  Thank you for sending this case to me. I have reviewed the entire note and agree with the plan.   Brianca Fortenberry Danis, MD  ____________________________________________________________  

## 2022-10-31 DIAGNOSIS — M5416 Radiculopathy, lumbar region: Secondary | ICD-10-CM | POA: Diagnosis not present

## 2022-10-31 DIAGNOSIS — M5459 Other low back pain: Secondary | ICD-10-CM | POA: Diagnosis not present

## 2022-11-02 DIAGNOSIS — M5459 Other low back pain: Secondary | ICD-10-CM | POA: Diagnosis not present

## 2022-11-02 DIAGNOSIS — M5416 Radiculopathy, lumbar region: Secondary | ICD-10-CM | POA: Diagnosis not present

## 2022-11-06 ENCOUNTER — Other Ambulatory Visit: Payer: Self-pay | Admitting: Family Medicine

## 2022-11-06 ENCOUNTER — Ambulatory Visit
Admission: RE | Admit: 2022-11-06 | Discharge: 2022-11-06 | Disposition: A | Discharge status: Home / Self Care | Payer: Medicare Other | Source: Ambulatory Visit | Attending: Family Medicine | Admitting: Family Medicine

## 2022-11-06 DIAGNOSIS — R921 Mammographic calcification found on diagnostic imaging of breast: Secondary | ICD-10-CM | POA: Diagnosis not present

## 2022-11-06 DIAGNOSIS — R928 Other abnormal and inconclusive findings on diagnostic imaging of breast: Secondary | ICD-10-CM | POA: Diagnosis not present

## 2022-11-07 DIAGNOSIS — I10 Essential (primary) hypertension: Secondary | ICD-10-CM | POA: Diagnosis not present

## 2022-11-07 DIAGNOSIS — E1140 Type 2 diabetes mellitus with diabetic neuropathy, unspecified: Secondary | ICD-10-CM | POA: Diagnosis not present

## 2022-11-07 DIAGNOSIS — I700 Atherosclerosis of aorta: Secondary | ICD-10-CM | POA: Diagnosis not present

## 2022-11-07 DIAGNOSIS — E785 Hyperlipidemia, unspecified: Secondary | ICD-10-CM | POA: Diagnosis not present

## 2022-11-08 DIAGNOSIS — F332 Major depressive disorder, recurrent severe without psychotic features: Secondary | ICD-10-CM | POA: Diagnosis not present

## 2022-11-09 DIAGNOSIS — M5416 Radiculopathy, lumbar region: Secondary | ICD-10-CM | POA: Diagnosis not present

## 2022-11-09 DIAGNOSIS — M5459 Other low back pain: Secondary | ICD-10-CM | POA: Diagnosis not present

## 2022-11-13 ENCOUNTER — Telehealth: Payer: Self-pay | Admitting: Physician Assistant

## 2022-11-13 NOTE — Telephone Encounter (Signed)
PT is calling to let us know that the Zenpep she was prescribed is giving her heartburn after eating and also makes her gag. Please advise.

## 2022-11-14 DIAGNOSIS — M5459 Other low back pain: Secondary | ICD-10-CM | POA: Diagnosis not present

## 2022-11-14 DIAGNOSIS — M5416 Radiculopathy, lumbar region: Secondary | ICD-10-CM | POA: Diagnosis not present

## 2022-11-14 NOTE — Telephone Encounter (Signed)
See note below and advise. 

## 2022-11-15 ENCOUNTER — Ambulatory Visit
Admission: RE | Admit: 2022-11-15 | Discharge: 2022-11-15 | Disposition: A | Discharge status: Home / Self Care | Payer: Medicare Other | Source: Ambulatory Visit | Attending: Family Medicine | Admitting: Family Medicine

## 2022-11-15 DIAGNOSIS — R928 Other abnormal and inconclusive findings on diagnostic imaging of breast: Secondary | ICD-10-CM

## 2022-11-15 DIAGNOSIS — F3341 Major depressive disorder, recurrent, in partial remission: Secondary | ICD-10-CM | POA: Diagnosis not present

## 2022-11-15 DIAGNOSIS — R921 Mammographic calcification found on diagnostic imaging of breast: Secondary | ICD-10-CM | POA: Diagnosis not present

## 2022-11-15 DIAGNOSIS — N6489 Other specified disorders of breast: Secondary | ICD-10-CM | POA: Diagnosis not present

## 2022-11-15 DIAGNOSIS — F411 Generalized anxiety disorder: Secondary | ICD-10-CM | POA: Diagnosis not present

## 2022-11-15 DIAGNOSIS — F900 Attention-deficit hyperactivity disorder, predominantly inattentive type: Secondary | ICD-10-CM | POA: Diagnosis not present

## 2022-11-15 HISTORY — PX: BREAST BIOPSY: SHX20

## 2022-11-15 NOTE — Telephone Encounter (Signed)
Pt returned call. Advised her of samples and appointment. Patient voiced understanding.

## 2022-11-15 NOTE — Telephone Encounter (Signed)
Creon samples left up front for pt to pick up. Pt scheduled to see Dr. Loletha Carrow 12/05/22 at 3:40pm. Detailed message left on pts voicemail.

## 2022-11-21 DIAGNOSIS — M5416 Radiculopathy, lumbar region: Secondary | ICD-10-CM | POA: Diagnosis not present

## 2022-11-21 DIAGNOSIS — M5459 Other low back pain: Secondary | ICD-10-CM | POA: Diagnosis not present

## 2022-11-23 ENCOUNTER — Other Ambulatory Visit: Payer: Self-pay

## 2022-11-23 DIAGNOSIS — M5416 Radiculopathy, lumbar region: Secondary | ICD-10-CM | POA: Diagnosis not present

## 2022-11-23 DIAGNOSIS — M5459 Other low back pain: Secondary | ICD-10-CM | POA: Diagnosis not present

## 2022-11-23 MED ORDER — ZENPEP 40000-126000 UNITS PO CPEP: ORAL_CAPSULE | ORAL | 0 refills | Status: DC

## 2022-11-23 NOTE — Telephone Encounter (Signed)
Inbound call from patient calling about medication that was sent into the pharmacy. Patient states it is a different prescription. Please advise.

## 2022-11-23 NOTE — Telephone Encounter (Signed)
Pt states that the creon samples are not working. States the zenpep was working better. She states she want a prescription sent in for the zenpep. Discussed with her she had told us the zenpep caused indigestion and made her gag. Pt states it did cause indigestion but it worked better and she reqeusts a refill on the zenpep. New script sent in.

## 2022-11-27 ENCOUNTER — Other Ambulatory Visit: Payer: Self-pay

## 2022-11-27 MED ORDER — ZENPEP 40000-126000 UNITS PO CPEP: ORAL_CAPSULE | ORAL | 3 refills | Status: DC

## 2022-11-27 NOTE — Telephone Encounter (Signed)
Pt states the creon was not helping that the samples of zenpep did help. She is requesting prescription for the zenpep be sent to the pharmacy. Script sent in as requested.

## 2022-11-27 NOTE — Telephone Encounter (Signed)
Patient is calling states none of the medications are helping. Please advise

## 2022-11-28 DIAGNOSIS — Z1329 Encounter for screening for other suspected endocrine disorder: Secondary | ICD-10-CM | POA: Diagnosis not present

## 2022-12-05 ENCOUNTER — Encounter: Payer: Self-pay | Admitting: Gastroenterology

## 2022-12-05 ENCOUNTER — Ambulatory Visit (INDEPENDENT_AMBULATORY_CARE_PROVIDER_SITE_OTHER): Payer: Medicare Other | Admitting: Gastroenterology

## 2022-12-05 VITALS — BP 124/68 | HR 67 | Ht 65.0 in | Wt 163.0 lb

## 2022-12-05 DIAGNOSIS — K7581 Nonalcoholic steatohepatitis (NASH): Secondary | ICD-10-CM

## 2022-12-05 DIAGNOSIS — K529 Noninfective gastroenteritis and colitis, unspecified: Secondary | ICD-10-CM

## 2022-12-05 DIAGNOSIS — M5459 Other low back pain: Secondary | ICD-10-CM | POA: Diagnosis not present

## 2022-12-05 DIAGNOSIS — M5416 Radiculopathy, lumbar region: Secondary | ICD-10-CM | POA: Diagnosis not present

## 2022-12-05 NOTE — Progress Notes (Signed)
[facility] progress note:  History: Amy [name] 12/05/2022  Referring provider: Laurann Montana, MD  Reason for consult/chief complaint: elevated lft's   Subjective  HPI: Amy [name] was here for consultation with our PA 4 weeks ago and in late January regarding elevated LFTs. February 2024 ultrasound with elastography showed fatty liver and stiffness score 4.2 Transaminitis felt likely due to metabolic dysfunction associated steatohepatitis given negative serologic workup and her risk factors. She also had chronic diarrhea, which was an issue when I saw her for colonoscopy in 2021, at which time colonoscopy was unremarkable with biopsies negative for microscopic colitis.  She was treated empirically for SIBO at that time with metronidazole and cephalexin.  Fecal elastase low at 42 on 09/28/2022 Had been given a trial of Creon, but was not taking it with food. _______________________  Amy [name] tells me that taking pancreatic enzymes resolved with diarrhea, and then when she stopped her diarrhea returned.  It has occasionally been so that she would have an episode of incontinence overnight. Her pharmacist also changed from Zenpep to Creon. She also did not feel that she had been losing weight in the months leading up to starting that medicine.   ROS:  Review of Systems Mood "up-and-down" Arthralgias Remainder systems negative except as above  Past Medical History: Past Medical History:  Diagnosis Date   ADHD (attention deficit hyperactivity disorder)    Anemia    Anxiety    Bipolar disorder    Carpal tunnel syndrome of right wrist 09/2014   Cataract, immature    Depression    Diabetic neuropathy    feet   Dyslipidemia    GERD (gastroesophageal reflux disease)    Gout    Hammer toe    Hyperlipidemia    Hypertension    states under control with med., has been on med. x 20 yrs.   IDA (iron deficiency anemia)    Insulin dependent diabetes mellitus     Major depressive disorder    Osteoarthritis    knees   Seborrheic dermatitis of scalp    SLEEP APNEA    uses CPAP nightly   Vitamin D deficiency      Past Surgical History: Past Surgical History:  Procedure Laterality Date   BREAST BIOPSY Right 11/15/2022   MM RT BREAST BX W LOC DEV 1ST LESION IMAGE BX SPEC STEREO GUIDE 11/15/2022 [facility]   BREAST REDUCTION SURGERY Bilateral    CARPAL TUNNEL RELEASE Right 10/08/2014   Procedure: RIGHT CARPAL TUNNEL RELEASE;  Surgeon: Cindee Salt, MD;  Location: [facility];  Service: Orthopedics;  Laterality: Right;   COLONOSCOPY WITH PROPOFOL  10/27/2013   ESOPHAGOGASTRODUODENOSCOPY  02/03/2004   LAPAROSCOPIC GASTRIC BANDING  12/05/2005     Family History: Family History  Problem Relation Age of Onset   Heart failure Mother    Heart disease Father    Heart attack Father    Breast cancer Sister    Diabetes Sister    Sarcoidosis Brother    Prostate cancer Brother    Colon cancer Neg Hx    Dementia Neg Hx    Esophageal cancer Neg Hx    Rectal cancer Neg Hx    Stomach cancer Neg Hx    Memory loss Neg Hx     Social History: Social History   Socioeconomic History   Marital status: Married    Spouse name: james   Number of children: 2   Years of education: 12   Highest education  level: High school graduate  Occupational History   Occupation: maintenance    Employer: [employer]  Tobacco Use   Smoking status: Never   Smokeless tobacco: Never  Vaping Use   Vaping Use: Never used  Substance and Sexual Activity   Alcohol use: No   Drug use: No   Sexual activity: Not Currently  Other Topics Concern   Not on file  Social History Narrative   Married with one son and one daughter   Caffeine: 2 cups a day of tea   Lives at home with her husband   Social Determinants of Health   Financial Resource Strain: Low Risk  (06/26/2019)   Overall Financial Resource Strain (CARDIA)    Difficulty of Paying Living  Expenses: Not hard at all  Food Insecurity: No Food Insecurity (07/12/2022)   Hunger Vital Sign    Worried About Running Out of Food in the Last Year: Never true    Ran Out of Food in the Last Year: Never true  Transportation Needs: No Transportation Needs (07/12/2022)   PRAPARE - Administrator, Civil ServiceTransportation    Lack of Transportation (Medical): No    Lack of Transportation (Non-Medical): No  Physical Activity: Insufficiently Active (06/26/2019)   Exercise Vital Sign    Days of Exercise per Week: 3 days    Minutes of Exercise per Session: 30 min  Stress: Stress Concern Present (06/26/2019)   [employer] - Occupational Stress Questionnaire    Feeling of Stress : Very much  Social Connections: Unknown (06/26/2019)   Social Connection and Isolation Panel [NHANES]    Frequency of Communication with Friends and Family: Not on file    Frequency of Social Gatherings with Friends and Family: Not on file    Attends Religious Services: 1 to 4 times per year    Active Member of [employer] or Organizations: Yes    Attends BankerClub or Organization Meetings: 1 to 4 times per year    Marital Status: Married    Allergies: Allergies  Allergen Reactions   Canagliflozin     Other reaction(s): yeast infection   Dulaglutide     Other reaction(s): diarhea/stool incontinenece   Ramipril     Other reaction(s): cough    Outpatient Meds: Current Outpatient Medications  Medication Sig Dispense Refill   Acetaminophen (TYLENOL PO) Take 500 mg by mouth as needed.     amLODipine (NORVASC) 2.5 MG tablet Take 2.5 mg by mouth daily.     amphetamine-dextroamphetamine (ADDERALL XR) 25 MG 24 hr capsule Take 25 mg by mouth every morning.     ARIPiprazole (ABILIFY) 5 MG tablet Take 5 mg by mouth at bedtime.     Armodafinil 250 MG tablet Take 250 mg by mouth every morning.     AUVELITY 45-105 MG TBCR Take 1 tablet by mouth 2 (two) times daily.     cetirizine (ZYRTEC) 10 MG tablet Take 10 mg by mouth  daily.     ergocalciferol (VITAMIN D2) 50000 UNITS capsule Take 50,000 Units by mouth every Monday.     ezetimibe-simvastatin (VYTORIN) 10-20 MG tablet Take 1 tablet by mouth daily.     FERREX 150 150 MG capsule Take 150 mg by mouth daily.  1   furosemide (LASIX) 40 MG tablet Take 40 mg by mouth daily.     gabapentin (NEURONTIN) 600 MG tablet Take 600 mg by mouth at bedtime.     HYDROcodone-acetaminophen (NORCO/VICODIN) 5-325 MG tablet Take 1 tablet by mouth daily as needed  for pain.     hydroquinone 4 % cream Apply 1 Application topically at bedtime.     Insulin Glargine (BASAGLAR KWIKPEN) 100 UNIT/ML Inject 26 Units into the skin at bedtime.     Insulin Pen Needle (FIFTY50 PEN NEEDLES) 31G X 5 MM MISC BD Ultra-Fine Mini Pen Needle 31 gauge x 3/16"     KLOR-CON M20 20 MEQ tablet Take 40 mEq by mouth daily.      L-Methylfolate-Algae (L-METHYLFOLATE FORTE) 15-90.314 MG CAPS Take 1 tablet by mouth at bedtime.     lithium carbonate (ESKALITH) 450 MG CR tablet Take 900 mg by mouth at bedtime.     metFORMIN (GLUCOPHAGE-XR) 500 MG 24 hr tablet Take 1,000 mg by mouth 2 (two) times daily.     MOUNJARO 2.5 MG/0.5ML Pen Inject 2.5 mg into the skin once a week.     NOVOLOG FLEXPEN 100 UNIT/ML FlexPen Inject 4-10 Units into the skin 3 (three) times daily.     omeprazole (PRILOSEC) 40 MG capsule TAKE 1 CAPSULE BY MOUTH TWICE A DAY 180 capsule 1   Pancrelipase, Lip-Prot-Amyl, (ZENPEP) 40000-126000 units CPEP Take 2 capsules with meals three times a day and take one capsule with snacks 72 capsule 3   SPRAVATO, 84 MG DOSE, 28 MG/DEVICE SOPK Place 3 sprays into both nostrils once a week. On Wednesday     telmisartan (MICARDIS) 80 MG tablet Take 80 mg by mouth daily.     traZODone (DESYREL) 50 MG tablet Take 200 mg by mouth at bedtime. 4 tablets at night     triamcinolone (NASACORT) 55 MCG/ACT AERO nasal inhaler Place 2 sprays into the nose daily.     triamcinolone cream (KENALOG) 0.1 % Apply 1 Application  topically daily.     triamcinolone cream (KENALOG) 0.1 % Apply 1 Application topically 2 (two) times daily. 30 g 0   TRINTELLIX 20 MG TABS tablet Take 20 mg by mouth daily.  3   zaleplon (SONATA) 10 MG capsule Take 20 mg by mouth at bedtime.     No current facility-administered medications for this visit.   Facility-Administered Medications Ordered in Other Visits  Medication Dose Route Frequency Provider Last Rate Last Admin   gadopentetate dimeglumine (MAGNEVIST) injection 14 mL  14 mL Intravenous Once PRN Anson Fret, MD          ___________________________________________________________________ Objective   Exam:  BP 124/68   Pulse 67   Ht 5\' 5"  (1.651 m)   Wt 163 lb (73.9 kg)   SpO2 99%   BMI 27.12 kg/m  Wt Readings from Last 3 Encounters:  12/05/22 163 lb (73.9 kg)  10/30/22 165 lb (74.8 kg)  09/25/22 157 lb 12.8 oz (71.6 kg)    General: Pleasant, flattened affect  CV: Regular without appreciable murmur, no JVD, no peripheral edema Resp: clear to auscultation bilaterally, normal RR and effort noted GI: soft, no tenderness, with active bowel sounds. No guarding or palpable organomegaly noted.  No bruit Skin; warm and dry, no rash or jaundice noted Neuro: awake, alert and oriented x 3. Normal gross motor function and fluent speech  Labs:     Latest Ref Rng & Units 10/30/2022    9:46 AM 09/25/2022    9:50 AM 06/17/2022   10:32 AM  CMP  Glucose 70 - 99 mg/dL 50  409  811   BUN 6 - 23 mg/dL 13  6  7    Creatinine 0.40 - 1.20 mg/dL 9.14  7.82  9.56  Sodium 135 - 145 mEq/L 140  139  137   Potassium 3.5 - 5.1 mEq/L 4.5  3.9  3.0   Chloride 96 - 112 mEq/L 104  103  101   CO2 19 - 32 mEq/L 31  29  29    Calcium 8.4 - 10.5 mg/dL 9.6  9.2  8.9   Total Protein 6.0 - 8.3 g/dL 6.5  6.5  7.4   Total Bilirubin 0.2 - 1.2 mg/dL 0.4  0.4  0.6   Alkaline Phos 39 - 117 U/L 172  203  167   AST 0 - 37 U/L 58  95  67   ALT 0 - 35 U/L 105  171  91      Radiologic  Studies:  CLINICAL DATA:  Abnormal imaging, elevated LFTs, question fibrosis   EXAM: [place] ELASTOGRAPHY HEPATIC   TECHNIQUE: Sonography of the liver was performed. In addition, ultrasound elastography evaluation of the liver was performed. A region of interest was placed within the right lobe of the liver. Following application of a compressive sonographic pulse, tissue compressibility was assessed. Multiple assessments were performed at the selected site. Median tissue compressibility was determined. Previously, hepatic stiffness was assessed by shear wave velocity. Based on recently published Society of Radiologists in Ultrasound consensus article, reporting is now recommended to be performed in the SI units of pressure (kiloPascals) representing hepatic stiffness/elasticity. The obtained result is compared to the published reference standards. (cACLD = compensated Advanced Chronic Liver Disease)   COMPARISON:  CT abdomen and pelvis 06/21/2021   FINDINGS: Liver: Echogenic parenchyma, likely fatty infiltration though this can be seen with cirrhosis and certain infiltrative disorders. No focal hepatic mass or nodularity. Portal vein is patent on color Doppler imaging with normal direction of blood flow towards the liver.   ULTRASOUND HEPATIC ELASTOGRAPHY   Device: Siemens Helix VTQ   Patient position: Oblique   Transducer: 9C2   Number of measurements: 12   Hepatic segment:  8   Median kPa: 4.2   IQR: 1.9   IQR/Median kPa ratio: 0.4   Data quality:  Good   Diagnostic category:  < or = 5 kPa: high probability of being normal   The use of hepatic elastography is applicable to patients with viral hepatitis and non-alcoholic fatty liver disease. At this time, there is insufficient data for the referenced cut-off values and use in other causes of liver disease, including alcoholic liver disease. Patients, however, may be assessed by elastography and serve as their  own reference standard/baseline.   In patients with non-alcoholic liver disease, the values suggesting compensated advanced chronic liver disease (cACLD) may be lower, and patients may need additional testing with elasticity results of 7-9 kPa.   Please note that abnormal hepatic elasticity and shear wave velocities may also be identified in clinical settings other than with hepatic fibrosis, such as: acute hepatitis, elevated right heart and central venous pressures including use of beta blockers, veno-occlusive disease (Budd-Chiari), infiltrative processes such as mastocytosis/amyloidosis/infiltrative tumor/lymphoma, extrahepatic cholestasis, with hyperemia in the post-prandial state, and with liver transplantation. Correlation with patient history, laboratory data, and clinical condition recommended.   Diagnostic Categories:   < or =5 kPa: high probability of being normal   < or =9 kPa: in the absence of other known clinical signs, rules out cACLD   >9 kPa and ?13 kPa: suggestive of cACLD, but needs further testing   >13 kPa: highly suggestive of cACLD   > or =17 kPa: highly suggestive of cACLD  with an increased probability of clinically significant portal hypertension   IMPRESSION: ULTRASOUND LIVER:   Suspected fatty infiltration of liver.   No additional hepatic sonographic abnormalities.   ULTRASOUND HEPATIC ELASTOGRAPHY:   Median kPa:  4.2   Diagnostic category:  < or = 5 kPa: high probability of being normal     Electronically Signed   By: Ulyses Southward M.D.   On: 10/02/2022 12:33  ___________________________  Last CTAP Oct 2022 - pancreas nml ? Focal SB loop dilatation (Report on file)  Assessment: Encounter Diagnoses  Name Primary?   Chronic diarrhea Yes   Metabolic dysfunction-associated steatohepatitis (MASH)     Elevated LFTs, mixed transaminitis and alkaline phosphatase with normal bilirubin.  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  We discussed the nature of  this condition and the possibility that it can eventually lead to cirrhosis.  It is essential that she significant reduce carbohydrate intake and do what she can to work with her PCP for optimal control of weight, glucose and lipids.  Regarding the diarrhea, she clearly improved on pancreatic enzyme supplements.  However, she has no risk factors for chronic pancreatitis/EPI and no family history of pancreatic issues.  Last abdominal imaging 18 months ago with no abnormalities of the pancreas seen. She had diarrhea in 2021 with negative endoscopic workup, and she reportedly improved on antibiotics given empirically for SIBO. She is certain that when she did the stool collection that it was not mixed with urine, which could dilute the results.  And it is unlikely she developed microscopic colitis since she did not have before in similar circumstances.  However, SIBO could cause watery stool that dilutes the fecal elastase result, making this look like exocrine pancreatic insufficiency.  Plan:  Before we resume pancreas enzyme supplements, I would like to give her a course of rifaximin 550 mg, 1 tablet twice daily for 10 days. If that is not authorized by insurance or otherwise cost prohibitive, then second line treatment would be metronidazole 250 mg 3 times daily for 14 days  She will contact me with a clinical update by portal message or nurse call about a week after finishing that treatment course.  If diarrhea not improved, then it would seem she truly does have EPI and will need to resume enzyme supplements.   35 minutes were spent on this encounter (including chart review, history/exam, counseling/coordination of care, and documentation) > 50% of that time was spent on counseling and coordination of care.   [name] III  CC: Referring provider noted above

## 2022-12-05 NOTE — Patient Instructions (Signed)
_______________________________________________________  If your blood pressure at your visit was 140/90 or greater, please contact your primary care physician to follow up on this.  _______________________________________________________  If you are age 65 or older, your body mass index should be between 23-30. Your Body mass index is 27.12 kg/m. If this is out of the aforementioned range listed, please consider follow up with your Primary Care Provider.  If you are age 2 or younger, your body mass index should be between 19-25. Your Body mass index is 27.12 kg/m. If this is out of the aformentioned range listed, please consider follow up with your Primary Care Provider.   ________________________________________________________  The [facility] providers would like to encourage you to use [facility] to communicate with providers for non-urgent requests or questions.  Due to long hold times on the telephone, sending your provider a message by [facility] may be a faster and more efficient way to get a response.  Please allow 48 business hours for a response.  Please remember that this is for non-urgent requests.  _______________________________________________________   We have given you the complete treatment coarse of XIFAXAN.  Take one twice a day for 10 days  Medication Samples have been provided to the patient.  Drug name: Xifaxan        Strength: 550mg         LOT: [id number]  Exp.Date: 09-2024  It was a pleasure to see you today!  Thank you for trusting me with your gastrointestinal care!

## 2022-12-06 ENCOUNTER — Telehealth: Payer: Self-pay | Admitting: Gastroenterology

## 2022-12-06 ENCOUNTER — Telehealth: Payer: Self-pay | Admitting: Physician Assistant

## 2022-12-06 NOTE — Telephone Encounter (Signed)
Patient call requesting a refill for Zenpep and for it to be sent [facility]. Please advise.

## 2022-12-06 NOTE — Telephone Encounter (Signed)
Error

## 2022-12-07 MED ORDER — ZENPEP 40000-126000 UNITS PO CPEP: ORAL_CAPSULE | ORAL | 3 refills | Status: DC

## 2022-12-07 NOTE — Telephone Encounter (Signed)
Zenpep refilled and sent to [facility]

## 2022-12-11 ENCOUNTER — Other Ambulatory Visit: Payer: Self-pay | Admitting: Physician Assistant

## 2022-12-11 MED ORDER — ZENPEP 40000-126000 UNITS PO CPEP: ORAL_CAPSULE | ORAL | 3 refills | Status: DC

## 2022-12-11 NOTE — Telephone Encounter (Signed)
Contacted pt & pt is aware of Zenpep being resent to [facility] due to an error.

## 2022-12-11 NOTE — Telephone Encounter (Signed)
Inbound call from patient, states she spoke to [facility] and they stated they had not received the medication.

## 2022-12-11 NOTE — Addendum Note (Signed)
Addended by: Rise Paganini on: 12/11/2022 03:02 PM   Modules accepted: Orders

## 2022-12-12 DIAGNOSIS — M5459 Other low back pain: Secondary | ICD-10-CM | POA: Diagnosis not present

## 2022-12-12 DIAGNOSIS — M5416 Radiculopathy, lumbar region: Secondary | ICD-10-CM | POA: Diagnosis not present

## 2022-12-13 DIAGNOSIS — F332 Major depressive disorder, recurrent severe without psychotic features: Secondary | ICD-10-CM | POA: Diagnosis not present

## 2022-12-14 NOTE — Telephone Encounter (Signed)
Inbound call from patient stating pharmacy cannot fill prescription. Please advise.  Thank you

## 2022-12-19 DIAGNOSIS — M5459 Other low back pain: Secondary | ICD-10-CM | POA: Diagnosis not present

## 2022-12-19 DIAGNOSIS — M5416 Radiculopathy, lumbar region: Secondary | ICD-10-CM | POA: Diagnosis not present

## 2022-12-20 NOTE — Telephone Encounter (Signed)
Contacted pt & advised to pick up Zenpep samples from office until I could figure out the issue with [facility].

## 2022-12-26 ENCOUNTER — Telehealth: Payer: Self-pay | Admitting: Gastroenterology

## 2022-12-26 NOTE — Telephone Encounter (Signed)
Inbound call from patient , she is wanting to speak with a nurse in regards some samples for [facility] Dr.Danis gave her. She said he had only 2 pills left and she still do not know what she should expect for medication neither whats the medication for. Please advise

## 2022-12-27 DIAGNOSIS — F332 Major depressive disorder, recurrent severe without psychotic features: Secondary | ICD-10-CM | POA: Diagnosis not present

## 2022-12-27 NOTE — Telephone Encounter (Signed)
Returned call to patient. I informed her that the Xifaxan was prescribed to see if her diarrhea would improve. Pt reports improvement in diarrhea already, but today is her last day to take medications. I advised pt to call back in a week with an update and if she continues to have diarrhea she will need to resume pancreatic enzymes. Patient verbalized understanding and had no concerns at the end of the call.

## 2023-01-01 NOTE — Telephone Encounter (Signed)
Contacted pt.-LVM

## 2023-01-01 NOTE — Telephone Encounter (Signed)
Inbound call from patient, states she has ran out of samples and is still unable to receive the prescription for her pharmacy. Also would like to know how long she should continue taking it?

## 2023-01-03 DIAGNOSIS — F332 Major depressive disorder, recurrent severe without psychotic features: Secondary | ICD-10-CM | POA: Diagnosis not present

## 2023-01-10 DIAGNOSIS — F332 Major depressive disorder, recurrent severe without psychotic features: Secondary | ICD-10-CM | POA: Diagnosis not present

## 2023-01-17 DIAGNOSIS — F332 Major depressive disorder, recurrent severe without psychotic features: Secondary | ICD-10-CM | POA: Diagnosis not present

## 2023-01-18 ENCOUNTER — Telehealth: Payer: Self-pay | Admitting: Gastroenterology

## 2023-01-18 NOTE — Telephone Encounter (Signed)
Patient called stating anytime she eats or drink her stools are yellow. Advised her of next appointment in August.Requesting a call back to discuss what could be done before then. Please advise, thank you.

## 2023-01-18 NOTE — Telephone Encounter (Signed)
Yellow stool is no cause for alarm.  Stool will be variable colors depending upon diet, other medicines, rate of stool passage, and other factors.  She has exocrine pancreatic insufficiency as evidenced by prior testing and should continue Creon current dose.  Imodium can be taken up to 2 tablets 3 times daily as needed to help control the diarrhea.  Next available follow-up appointment.   - HD

## 2023-01-18 NOTE — Telephone Encounter (Signed)
Returned call to patient. Pt reports that she has noticed that all of her stools are "yellow in color, does not look like mucous". Pt has been back on Creon for about 4 weeks, she take 2 capsules with meals and 1 capsule with a snack. Pt states that she is still having diarrhea, mainly at night. Pt reports having about 3 stools yesterday evening, she even experienced some incontinence. Pt denies any new medications, supplements, or anything in her diet that she can think of that would cause her stools to turn yellow. Please advise, thanks.

## 2023-01-19 NOTE — Telephone Encounter (Signed)
Called and spoke with patient regarding Dr. Myrtie Neither' recommendations. Patient has been scheduled for a follow up appt with Dr. Myrtie Neither on Wednesday, 04/04/23 at 9:20 am. Pt is aware that I will mail her appt information, she confirmed address on file. Pt verbalized understanding and had no concerns at the end of the call.

## 2023-01-23 DIAGNOSIS — F332 Major depressive disorder, recurrent severe without psychotic features: Secondary | ICD-10-CM | POA: Diagnosis not present

## 2023-01-30 ENCOUNTER — Telehealth: Payer: Self-pay | Admitting: *Deleted

## 2023-01-30 ENCOUNTER — Other Ambulatory Visit (INDEPENDENT_AMBULATORY_CARE_PROVIDER_SITE_OTHER): Payer: Medicare Other

## 2023-01-30 ENCOUNTER — Other Ambulatory Visit: Payer: Self-pay | Admitting: *Deleted

## 2023-01-30 ENCOUNTER — Ambulatory Visit
Admission: RE | Admit: 2023-01-30 | Discharge: 2023-01-30 | Disposition: A | Discharge status: Home / Self Care | Payer: Federal, State, Local not specified - PPO | Source: Ambulatory Visit | Attending: Family Medicine | Admitting: Family Medicine

## 2023-01-30 DIAGNOSIS — R7989 Other specified abnormal findings of blood chemistry: Secondary | ICD-10-CM

## 2023-01-30 DIAGNOSIS — E2839 Other primary ovarian failure: Secondary | ICD-10-CM

## 2023-01-30 DIAGNOSIS — Z8262 Family history of osteoporosis: Secondary | ICD-10-CM | POA: Diagnosis not present

## 2023-01-30 DIAGNOSIS — E559 Vitamin D deficiency, unspecified: Secondary | ICD-10-CM | POA: Diagnosis not present

## 2023-01-30 DIAGNOSIS — E349 Endocrine disorder, unspecified: Secondary | ICD-10-CM | POA: Diagnosis not present

## 2023-01-30 DIAGNOSIS — N951 Menopausal and female climacteric states: Secondary | ICD-10-CM | POA: Diagnosis not present

## 2023-01-30 LAB — COMPREHENSIVE METABOLIC PANEL
ALT: 92 U/L — ABNORMAL HIGH (ref 0–35)
AST: 49 U/L — ABNORMAL HIGH (ref 0–37)
Albumin: 4 g/dL (ref 3.5–5.2)
Alkaline Phosphatase: 176 U/L — ABNORMAL HIGH (ref 39–117)
BUN: 9 mg/dL (ref 6–23)
CO2: 31 mEq/L (ref 19–32)
Calcium: 9.3 mg/dL (ref 8.4–10.5)
Chloride: 101 mEq/L (ref 96–112)
Creatinine, Ser: 1 mg/dL (ref 0.40–1.20)
GFR: 59.24 mL/min — ABNORMAL LOW (ref >60.00)
Glucose, Bld: 86 mg/dL (ref 70–99)
Potassium: 4.4 mEq/L (ref 3.5–5.1)
Sodium: 136 mEq/L (ref 135–145)
Total Bilirubin: 0.3 mg/dL (ref 0.2–1.2)
Total Protein: 7 g/dL (ref 6.0–8.3)

## 2023-01-30 NOTE — Telephone Encounter (Signed)
-----   Message from Chrystie Nose, RN sent at 01/30/2023  7:44 AM EDT ----- Regarding: FW: cmet  ----- Message ----- From: Chrystie Nose, RN Sent: 01/30/2023  12:00 AM EDT To: Chrystie Nose, RN Subject: cmet                                           Pt needs repeat CMP in 3 mth

## 2023-01-30 NOTE — Telephone Encounter (Signed)
Patient called and LMTCB.

## 2023-01-31 DIAGNOSIS — F332 Major depressive disorder, recurrent severe without psychotic features: Secondary | ICD-10-CM | POA: Diagnosis not present

## 2023-02-06 DIAGNOSIS — F332 Major depressive disorder, recurrent severe without psychotic features: Secondary | ICD-10-CM | POA: Diagnosis not present

## 2023-02-09 ENCOUNTER — Ambulatory Visit: Payer: Medicare Other | Attending: Internal Medicine | Admitting: Internal Medicine

## 2023-02-09 ENCOUNTER — Encounter: Payer: Self-pay | Admitting: Internal Medicine

## 2023-02-09 VITALS — BP 110/71 | HR 71 | Resp 15 | Ht 65.0 in | Wt 156.6 lb

## 2023-02-09 DIAGNOSIS — M25552 Pain in left hip: Secondary | ICD-10-CM

## 2023-02-09 DIAGNOSIS — R768 Other specified abnormal immunological findings in serum: Secondary | ICD-10-CM | POA: Diagnosis not present

## 2023-02-09 DIAGNOSIS — K8689 Other specified diseases of pancreas: Secondary | ICD-10-CM

## 2023-02-09 DIAGNOSIS — M19041 Primary osteoarthritis, right hand: Secondary | ICD-10-CM

## 2023-02-09 DIAGNOSIS — M19042 Primary osteoarthritis, left hand: Secondary | ICD-10-CM

## 2023-02-09 NOTE — Progress Notes (Signed)
Office Visit Note  Patient: Amy [name]             Date of Birth: Jun 01, 1956           MRN: [id number]             PCP: Laurann Montana, MD Referring: Javier Glazier Visit Date: 02/09/2023 Occupation: @GUAROCC @  Subjective:   History of Present Illness: Amy [name] is a 67 y.o. female here for evaluation of positive ANA this was originally checked in evaluation of abnormal liver function but referred to rheumatology evaluate other possible causes of ANA in the setting of chronic joint pain as well.  No GI history significant for previous laparoscopic gastric band surgery years ago.  More recently having symptoms with frequent diarrhea developing yellow greasy discoloration in stools lab findings consistent for pancreatic insufficiency.  Mild elevated LFTs with elastography is most suggestive for steatosis. Joint history she has previous carpal tunnel surgical release more than 10 years ago.  However recently developed numbness frequently radiating down the entire arm.  She has to let this hang in a dependent position for a while and symptoms decreased.  Also experiences pain on the side of the hip extending down to the level of the knee worse on her left side.  She takes Tylenol as needed sometimes has gabapentin 600 mg at night partially beneficial for joint pains. She does not notice any new skin rashes or discoloration.  No new oral ulcers, lymphadenopathy, Raynaud's symptoms, or abnormal bruising or blood clots.   Labs reviewed ANA pos AMA/ASMA neg  Activities of Daily Living:  Patient reports morning stiffness for several hours.   Patient Reports nocturnal pain.  Difficulty dressing/grooming: Denies Difficulty climbing stairs: Reports Difficulty getting out of chair: Reports Difficulty using hands for taps, buttons, cutlery, and/or writing: Reports  Review of Systems  Constitutional:  Positive for fatigue.  HENT:  Positive for mouth dryness and sore tongue.  Negative for mouth sores.   Eyes:  Negative for dryness.  Respiratory:  Positive for shortness of breath.   Cardiovascular:  Positive for chest pain. Negative for palpitations.  Gastrointestinal:  Positive for diarrhea. Negative for blood in stool and constipation.  Endocrine: Positive for increased urination.  Genitourinary:  Positive for involuntary urination and nocturia.  Musculoskeletal:  Positive for joint pain, gait problem, joint pain, myalgias, muscle weakness, morning stiffness, muscle tenderness and myalgias. Negative for joint swelling.  Skin:  Positive for color change, rash and hair loss. Negative for sensitivity to sunlight.  Allergic/Immunologic: Negative for susceptible to infections.  Neurological:  Positive for dizziness, numbness and headaches.  Hematological:  Negative for swollen glands.  Psychiatric/Behavioral:  Positive for depressed mood and sleep disturbance. The patient is nervous/anxious.     PMFS History:  Patient Active Problem List   Diagnosis Date Noted   Positive ANA (antinuclear antibody) 02/09/2023   Pancreatic insufficiency 02/09/2023   Osteoarthritis of both hands 02/09/2023   Pain in left hip 02/09/2023   Diabetes mellitus (HCC) 11/23/2017   Postmenopausal bleeding 11/23/2017   Fitting and adjustment of gastric lap band 09/05/2013   Other hammer toe (acquired) 06/03/2013   Other and unspecified hyperlipidemia 04/28/2013   TIA (transient ischemic attack) 04/09/2013   Lapband Vagotomy Study Pt May 2007 with 10 cm lapband 08/04/2011   ESOPHAGEAL REFLUX 10/10/2010   HEMATEMESIS 10/10/2010   DYSPHAGIA UNSPECIFIED 10/10/2010   EDEMA 12/20/2009   OBESITY, MORBID 01/18/2009   SLEEP APNEA 01/18/2009   NUMBNESS  04/20/2008   Type II or unspecified type diabetes mellitus with neurological manifestations, uncontrolled(250.62) 03/14/2007   DEPRESSION 03/14/2007   HYPERTENSION 03/14/2007   ALLERGIC RHINITIS 03/14/2007    Past Medical History:   Diagnosis Date   ADHD (attention deficit hyperactivity disorder)    Anemia    Anxiety    Bipolar disorder (HCC)    Carpal tunnel syndrome of right wrist 09/2014   Cataract, immature    Depression    Diabetic neuropathy (HCC)    feet   Dyslipidemia    GERD (gastroesophageal reflux disease)    Gout    Hammer toe    Hyperlipidemia    Hypertension    states under control with med., has been on med. x 20 yrs.   IDA (iron deficiency anemia)    Insulin dependent diabetes mellitus    Major depressive disorder    Osteoarthritis    knees   Seborrheic dermatitis of scalp    SLEEP APNEA    uses CPAP nightly   Vitamin D deficiency     Family History  Problem Relation Age of Onset   Heart failure Mother    Heart disease Father    Heart attack Father    Breast cancer Sister    Diabetes Sister    Diabetes Sister    Sarcoidosis Brother    Prostate cancer Brother    Lung disease Brother    Diabetes Son    Diabetes Daughter    Colon cancer Neg Hx    Dementia Neg Hx    Esophageal cancer Neg Hx    Rectal cancer Neg Hx    Stomach cancer Neg Hx    Memory loss Neg Hx    Past Surgical History:  Procedure Laterality Date   BREAST BIOPSY Right 11/15/2022   MM RT BREAST BX W LOC DEV 1ST LESION IMAGE BX SPEC STEREO GUIDE 11/15/2022 [facility]   BREAST REDUCTION SURGERY Bilateral    CARPAL TUNNEL RELEASE Right 10/08/2014   Procedure: RIGHT CARPAL TUNNEL RELEASE;  Surgeon: Cindee Salt, MD;  Location:  SUR[facility]ERY CENTER;  Service: Orthopedics;  Laterality: Right;   COLONOSCOPY WITH PROPOFOL  10/27/2013   ESOPHAGOGASTRODUODENOSCOPY  02/03/2004   LAPAROSCOPIC GASTRIC BANDING  12/05/2005   Social History   Social History Narrative   Married with one son and one daughter   Caffeine: 2 cups a day of tea   Lives at home with her husband   Immunization History  Administered Date(s) Administered   Hepatitis B 07/12/2016   Influenza Whole 04/28/2009   Influenza-Unspecified  05/05/2015, 07/12/2016   PFIZER(Purple Top)SARS-COV-2 Vaccination 11/14/2019, 12/09/2019, 07/18/2020   Pfizer Covid-19 Vaccine Bivalent Booster 64yrs & up 06/18/2021, 10/01/2021     Objective: Vital Signs: BP 110/71 (BP Location: Right Arm, Patient Position: Sitting, Cuff Size: Normal)   Pulse 71   Resp 15   Ht 5\' 5"  (1.651 m)   Wt 156 lb 9.6 oz (71 kg)   BMI 26.06 kg/m    Physical Exam Eyes:     Conjunctiva/sclera: Conjunctivae normal.  Cardiovascular:     Rate and Rhythm: Normal rate and regular rhythm.  Pulmonary:     Effort: Pulmonary effort is normal.     Breath sounds: Normal breath sounds.  Musculoskeletal:     Right lower leg: No edema.     Left lower leg: No edema.  Lymphadenopathy:     Cervical: No cervical adenopathy.  Skin:    General: Skin is warm and dry.  Findings: No rash.  Neurological:     Mental Status: She is alert.  Psychiatric:        Mood and Affect: Mood normal.      Musculoskeletal Exam:  Shoulders full ROM no tenderness or swelling Elbows full ROM no tenderness or swelling Wrists full ROM no tenderness or swelling Fingers full ROM deviation of 1st MCP joints b/l, left 2nd DIP heberdon's nodes, no synovitis Left lateral hip tenderness to pressure, goes over lateral side of thigh down to knee, normal rotation ROM without pain Knees full ROM no tenderness or swelling   Investigation: No additional findings.  Imaging: DG BONE DENSITY (DXA)  Result Date: 01/30/2023 EXAM: DUAL X-RAY ABSORPTIOMETRY (DXA) FOR BONE MINERAL DENSITY IMPRESSION: Referring Physician:  CYNTHIA WHITE Your patient completed a bone mineral density test using GE Lunar iDXA system (analysis version: 16). Technologist:    lmn PATIENT: Name: Marry, Merriman Patient ID: [id number] Birth Date: [birth year] Height: 63.7 in. Sex: Female Measured: 01/30/2023 Weight: 156.0 lbs. Indications: Bariatric surgery, Desyrel, Estrogen Deficient, Family Hist. (Parent hip fracture),  Gabapentin, Insulin for Diabetes, Postmenopausal, Vitamin D Deficient Fractures: NONE Treatments: Vitamin D (E933.5) ASSESSMENT: The BMD measured at Femur Total Left is 0.839 g/cm2 with a T-score of -1.3. This patient is considered osteopenic/low bone mass according to World Health Organization [facility]) criteria. The quality of the exam is good. The lumbar spine was excluded due to degenerative changes and surgical hardware. Site Region Measured Date Measured Age YA BMD Significant CHANGE T-score DualFemur Total Left 01/30/2023 65.2 -1.3 0.839 g/cm2 * DualFemur Total Left 03/14/2019 61.3 -1.1 0.874 g/cm2 DualFemur Total Mean 01/30/2023 65.2 -1.3 0.842 g/cm2 DualFemur Total Mean 03/14/2019 61.3 -1.2 0.859 g/cm2 Left Forearm Radius 33% 01/30/2023 65.2 -1.0 0.784 g/cm2 World Health Organization [facility]) criteria for post-menopausal, Caucasian Women: Normal       T-score at or above -1 SD Osteopenia   T-score between -1 and -2.5 SD Osteoporosis T-score at or below -2.5 SD RECOMMENDATION: 1. All patients should optimize calcium and vitamin D intake. 2. Consider FDA-approved medical therapies in postmenopausal women and men aged 58 years and older, based on the following: a. A hip or vertebral (clinical or morphometric) fracture. b. T-score = -2.5 at the femoral neck or spine after appropriate evaluation to exclude secondary causes. c. Low bone mass (T-score between -1.0 and -2.5 at the femoral neck or spine) and a 10-year probability of a hip fracture = 3% or a 10-year probability of a major osteoporosis-related fracture = 20% based on the [place]-adapted WHO algorithm. d. Clinician judgment and/or patient preferences may indicate treatment for people with 10-year fracture probabilities above or below these levels. FOLLOW-UP: Patients with diagnosis of osteoporosis or at high risk for fracture should have regular bone mineral density tests.? Patients eligible for Medicare are allowed routine testing every 2 years.? The testing  frequency can be increased to one year for patients who have rapidly progressing disease, are receiving or discontinuing medical therapy to restore bone mass, or have additional risk factors. I have reviewed this study and agree with the findings. [facility] FRAX* 10-year Probability of Fracture Based on femoral neck BMD: DualFemur (Right) Major Osteoporotic Fracture: 7.2% Hip Fracture:                0.3% Population:                  [place] ([place]) Risk Factors:  Family Hist. (Parent hip fracture) *FRAX is a Armed forces logistics/support/administrative officer of the [employer] of [employer] for Metabolic Bone Disease, a World Science writer (WHO) [employer]. ASSESSMENT: The probability of a major osteoporotic fracture is 7.2% within the next ten years. The probability of a hip fracture is 0.3% within the next ten years. Electronically Signed   By: Bary Richard M.D.   On: 01/30/2023 15:12    Recent Labs: Lab Results  Component Value Date   WBC 6.3 10/30/2022   HGB 12.8 10/30/2022   PLT 214.0 10/30/2022   NA 136 01/30/2023   K 4.4 01/30/2023   CL 101 01/30/2023   CO2 31 01/30/2023   GLUCOSE 86 01/30/2023   BUN 9 01/30/2023   CREATININE 1.00 01/30/2023   BILITOT 0.3 01/30/2023   ALKPHOS 176 (H) 01/30/2023   AST 49 (H) 01/30/2023   ALT 92 (H) 01/30/2023   PROT 7.0 01/30/2023   ALBUMIN 4.0 01/30/2023   CALCIUM 9.3 01/30/2023   GFRAA >60 07/07/2019    Speciality Comments: No specialty comments available.  Procedures:  No procedures performed Allergies: Canagliflozin, Dulaglutide, and Ramipril   Assessment / Plan:     Visit Diagnoses: Positive ANA (antinuclear antibody) - Plan: Anti-scleroderma antibody, RNP Antibody, Anti-Smith antibody, Sjogrens syndrome-A extractable nuclear antibody, Sjogrens syndrome-B extractable nuclear antibody, Anti-DNA antibody, double-stranded, C3 and C4, IGG SUBCLASS 4  Positive ANA does not have specific clinical criteria for  autoimmune connective tissue disease on exam and history today.  Will check specific antibody panel as detailed above also serum complements and IgG4 level.  Lower pretest suspicion of symptoms indicating a systemic connective tissue disease so would not recommend any additional empiric treatment or schedule follow-up unless abnormal results.  Pancreatic insufficiency  Not entirely clear about because patient does have longstanding history of diabetes.  Some other autoimmune diseases could contribute to pancreatic exocrine dysfunction such as Sjogren's or IgG4 related disease screening for labs as above.  Osteoarthritis of both hands, unspecified osteoarthritis type  Exam most consistent with primary osteoarthritis of both hands with prominent Heberden's nodes.  No appreciable synovitis.  Pain in left hip  Left hip pain is most consistent with greater trochanteric pain syndrome possible bursitis.  Provided printed range of motion and strengthening exercises to work on at home.  Discussed if failure to improve could have option for referral to physical therapy or trial of local steroid injection.  Orders: Orders Placed This Encounter  Procedures   Anti-scleroderma antibody   RNP Antibody   Anti-Smith antibody   Sjogrens syndrome-A extractable nuclear antibody   Sjogrens syndrome-B extractable nuclear antibody   Anti-DNA antibody, double-stranded   C3 and C4   IGG SUBCLASS 4   No orders of the defined types were placed in this encounter.    Follow-Up Instructions: No follow-ups on file.   Fuller Plan, MD  Note - This record has been created using [employer].  Chart creation errors have been sought, but may not always  have been located. Such creation errors do not reflect on  the standard of medical care.

## 2023-02-11 LAB — ANTI-DNA ANTIBODY, DOUBLE-STRANDED: ds DNA Ab: <1 IU/mL

## 2023-02-11 LAB — SJOGRENS SYNDROME-B EXTRACTABLE NUCLEAR ANTIBODY: SSB (La) (ENA) Antibody, IgG: <1 AI

## 2023-02-11 LAB — SJOGRENS SYNDROME-A EXTRACTABLE NUCLEAR ANTIBODY: SSA (Ro) (ENA) Antibody, IgG: <1 AI

## 2023-02-11 LAB — C3 AND C4
C3 Complement: 127 mg/dL (ref 83–193)
C4 Complement: 29 mg/dL (ref 15–57)

## 2023-02-11 LAB — ANTI-SCLERODERMA ANTIBODY: Scleroderma (Scl-70) (ENA) Antibody, IgG: <1 AI

## 2023-02-11 LAB — RNP ANTIBODY: Ribonucleic Protein(ENA) Antibody, IgG: <1 AI

## 2023-02-11 LAB — IGG SUBCLASS 4: IgG Subclass 4: 20.4 mg/dL (ref 4.0–86.0)

## 2023-02-11 LAB — ANTI-SMITH ANTIBODY: ENA SM Ab Ser-aCnc: <1 AI

## 2023-02-13 DIAGNOSIS — E7800 Pure hypercholesterolemia, unspecified: Secondary | ICD-10-CM | POA: Diagnosis not present

## 2023-02-13 DIAGNOSIS — F314 Bipolar disorder, current episode depressed, severe, without psychotic features: Secondary | ICD-10-CM | POA: Diagnosis not present

## 2023-02-13 DIAGNOSIS — E1165 Type 2 diabetes mellitus with hyperglycemia: Secondary | ICD-10-CM | POA: Diagnosis not present

## 2023-02-13 DIAGNOSIS — I10 Essential (primary) hypertension: Secondary | ICD-10-CM | POA: Diagnosis not present

## 2023-02-13 NOTE — Telephone Encounter (Signed)
Called and spoke with pt. Pt states that she was previously on Zenpep and did well, but her insurance no longer covered it so prescription was changed to Creon. Pt reports that she has been on Creon 2 capsules with meals for the last 2 months. Pt reports that she continued to have incontinence in the evenings. I asked patient if she added the Imodium and she has not. I explained to patient that she can take OTC Imodium 2 tablets TID PRN for diarrhea. Pt has not tried this yet. Pt will continue Creon and add Imodium PRN. Pt has been advised to let us know if she has any other concerns prior to her f/u in August. Pt verbalized understanding and had no concerns at the end of the  call.

## 2023-02-13 NOTE — Telephone Encounter (Signed)
Patient called states she still has diarrhea and the Creon has not been helping. Seeking further advise.

## 2023-02-14 DIAGNOSIS — F332 Major depressive disorder, recurrent severe without psychotic features: Secondary | ICD-10-CM | POA: Diagnosis not present

## 2023-02-21 DIAGNOSIS — F332 Major depressive disorder, recurrent severe without psychotic features: Secondary | ICD-10-CM | POA: Diagnosis not present

## 2023-03-05 DIAGNOSIS — F332 Major depressive disorder, recurrent severe without psychotic features: Secondary | ICD-10-CM | POA: Diagnosis not present

## 2023-03-07 DIAGNOSIS — F332 Major depressive disorder, recurrent severe without psychotic features: Secondary | ICD-10-CM | POA: Diagnosis not present

## 2023-03-09 ENCOUNTER — Other Ambulatory Visit (HOSPITAL_COMMUNITY): Payer: Self-pay | Admitting: Family Medicine

## 2023-03-09 DIAGNOSIS — E1142 Type 2 diabetes mellitus with diabetic polyneuropathy: Secondary | ICD-10-CM | POA: Diagnosis not present

## 2023-03-09 DIAGNOSIS — E1140 Type 2 diabetes mellitus with diabetic neuropathy, unspecified: Secondary | ICD-10-CM | POA: Diagnosis not present

## 2023-03-09 DIAGNOSIS — F3175 Bipolar disorder, in partial remission, most recent episode depressed: Secondary | ICD-10-CM | POA: Diagnosis not present

## 2023-03-09 DIAGNOSIS — R0789 Other chest pain: Secondary | ICD-10-CM | POA: Diagnosis not present

## 2023-03-09 DIAGNOSIS — E785 Hyperlipidemia, unspecified: Secondary | ICD-10-CM | POA: Diagnosis not present

## 2023-03-09 DIAGNOSIS — K7581 Nonalcoholic steatohepatitis (NASH): Secondary | ICD-10-CM | POA: Diagnosis not present

## 2023-03-09 DIAGNOSIS — I10 Essential (primary) hypertension: Secondary | ICD-10-CM | POA: Diagnosis not present

## 2023-03-09 DIAGNOSIS — Z79899 Other long term (current) drug therapy: Secondary | ICD-10-CM | POA: Diagnosis not present

## 2023-03-09 DIAGNOSIS — Z23 Encounter for immunization: Secondary | ICD-10-CM | POA: Diagnosis not present

## 2023-03-14 DIAGNOSIS — F332 Major depressive disorder, recurrent severe without psychotic features: Secondary | ICD-10-CM | POA: Diagnosis not present

## 2023-03-21 DIAGNOSIS — F332 Major depressive disorder, recurrent severe without psychotic features: Secondary | ICD-10-CM | POA: Diagnosis not present

## 2023-03-26 ENCOUNTER — Ambulatory Visit (HOSPITAL_COMMUNITY)
Admission: RE | Admit: 2023-03-26 | Discharge: 2023-03-26 | Disposition: A | Discharge status: Home / Self Care | Payer: Medicare Other | Source: Ambulatory Visit | Attending: Family Medicine | Admitting: Family Medicine

## 2023-03-26 ENCOUNTER — Encounter (HOSPITAL_COMMUNITY): Payer: Self-pay

## 2023-03-26 DIAGNOSIS — R0789 Other chest pain: Secondary | ICD-10-CM | POA: Insufficient documentation

## 2023-03-28 DIAGNOSIS — F332 Major depressive disorder, recurrent severe without psychotic features: Secondary | ICD-10-CM | POA: Diagnosis not present

## 2023-04-04 ENCOUNTER — Ambulatory Visit: Payer: Medicare Other | Admitting: Gastroenterology

## 2023-04-04 ENCOUNTER — Other Ambulatory Visit: Payer: Medicare Other

## 2023-04-04 ENCOUNTER — Encounter: Payer: Self-pay | Admitting: Gastroenterology

## 2023-04-04 VITALS — BP 104/68 | HR 84 | Ht 64.0 in | Wt 155.4 lb

## 2023-04-04 DIAGNOSIS — K8681 Exocrine pancreatic insufficiency: Secondary | ICD-10-CM

## 2023-04-04 DIAGNOSIS — K529 Noninfective gastroenteritis and colitis, unspecified: Secondary | ICD-10-CM

## 2023-04-04 DIAGNOSIS — K5900 Constipation, unspecified: Secondary | ICD-10-CM | POA: Diagnosis not present

## 2023-04-04 DIAGNOSIS — F332 Major depressive disorder, recurrent severe without psychotic features: Secondary | ICD-10-CM | POA: Diagnosis not present

## 2023-04-04 NOTE — Patient Instructions (Addendum)
_______________________________________________________  If your blood pressure at your visit was 140/90 or greater, please contact your primary care physician to follow up on this.  _______________________________________________________  If you are age 65 or older, your body mass index should be between 23-30. Your Body mass index is 26.67 kg/m. If this is out of the aforementioned range listed, please consider follow up with your Primary Care Provider.  If you are age 37 or younger, your body mass index should be between 19-25. Your Body mass index is 26.67 kg/m. If this is out of the aformentioned range listed, please consider follow up with your Primary Care Provider.   ________________________________________________________  The [place] [facility] providers would like to encourage you to use [facility] to communicate with providers for non-urgent requests or questions.  Due to long hold times on the telephone, sending your provider a message by [facility] may be a faster and more efficient way to get a response.  Please allow 48 business hours for a response.  Please remember that this is for non-urgent requests.  _______________________________________________________  STOP Creon  STOP Imodium   Fecal elastase in 2 weeks.   It was a pleasure to see you today!  Thank you for trusting me with your gastrointestinal care!

## 2023-04-04 NOTE — Progress Notes (Signed)
[facility] Progress Note  Chief Complaint:  Chief Complaint  Patient presents with   Constipation    Pt states she haven't had a BM in 1 week   exocrine pancreatic insufficiency     Subjective  History: Chronic diarrhea and question of pancreatic insufficiency, last office visit was extensive clinical details on 12/05/2022. Amy [name] had no abnormalities of the pancreas on 2022 imaging and no risk factors for EPI, though her fecal elastase was low at 42 earlier this year.  I wonder whether that could have had some dilutional effect from other causes of diarrhea.  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 ______________________________ It is somewhat difficult to follow the timeline of symptoms and question of response to medicines, but she does not recall having any significant change in symptoms after the rifaximin treatment. Today, she complains constipation that has started about a month ago, but worse within the last week.  No BM for about 7 days now.. She also states that her diarrhea symptoms have stopped about a month ago. She has been taking Creon 36000 units daily and has been taking Imodium for about a month and half. She typically takes imodium with each meal.   She's been taking Mounjaro 7.5 mg for about 2 months now. She also reports taking metformin for about 15 years for diabetes and has been taking iron supplements per the recommendation of her PCP.   We also reviewed her previous visit and thoroughly discussed her medications.    Patient denies any nausea, blood in stool, black stool, vomiting, abdominal pain, bloating, unintentional weight loss, reflux, dysphagia.   ROS: Review of Systems  Constitutional:  Negative for appetite change  and fever.  HENT:  Negative for trouble swallowing.   Respiratory:  Negative for cough and shortness of breath.   Cardiovascular:  Negative for chest pain.  Gastrointestinal:  Positive for constipation and diarrhea. Negative for abdominal distention, abdominal pain, anal bleeding, blood in stool, nausea, rectal pain and vomiting.  Genitourinary:  Negative for dysuria.  Musculoskeletal:  Negative for back pain.  Skin:  Negative for rash.  Neurological:  Negative for weakness.  All other systems reviewed and are negative.    The patient's Past Medical, Family and Social History were reviewed and are on file in the EMR.  Objective:  Med list reviewed  Current Outpatient Medications:    Acetaminophen (TYLENOL PO), Take 500 mg by mouth as needed., Disp: , Rfl:    amLODipine (NORVASC) 2.5 MG tablet, Take 2.5 mg by mouth daily., Disp: , Rfl:    Armodafinil 250 MG tablet, Take 250 mg by mouth every morning., Disp: , Rfl:    Azelastine HCl 137 MCG/SPRAY SOLN, Place 1 spray into the nose daily., Disp: , Rfl:    cetirizine (ZYRTEC) 10 MG tablet, Take 10 mg by mouth daily., Disp: , Rfl:    Continuous Glucose Sensor (DEXCOM G7 SENSOR) MISC, , Disp: , Rfl:    CREON 36000-114000 units CPEP capsule, Take 36,000 Units by mouth 3 (three) times daily before meals., Disp: , Rfl:    Cyanocobalamin 1000 MCG SUBL, Take 1 tablet by mouth daily., Disp: , Rfl:    ergocalciferol (VITAMIN D2) 50000 UNITS capsule, Take 50,000 Units  by mouth every Monday., Disp: , Rfl:    FERREX 150 150 MG capsule, Take 150 mg by mouth daily., Disp: , Rfl: 1   furosemide (LASIX) 40 MG tablet, Take 40 mg by mouth daily., Disp: , Rfl:    gabapentin (NEURONTIN) 300 MG capsule, Take 300 mg by mouth 3 (three) times daily., Disp: , Rfl:    HYDROcodone-acetaminophen (NORCO/VICODIN) 5-325 MG tablet, Take 1 tablet by mouth daily as needed for pain., Disp: , Rfl:    hydroquinone 4 % cream, Apply 1 Application topically at bedtime., Disp: ,  Rfl:    INGREZZA 40 MG capsule, Take 40 mg by mouth daily., Disp: , Rfl:    Insulin Glargine (BASAGLAR KWIKPEN) 100 UNIT/ML, Inject 26 Units into the skin at bedtime., Disp: , Rfl:    Insulin Pen Needle (FIFTY50 PEN NEEDLES) 31G X 5 MM MISC, BD Ultra-Fine Mini Pen Needle 31 gauge x 3/16", Disp: , Rfl:    JORNAY PM 80 MG CP24, Take 1 capsule by mouth at bedtime., Disp: , Rfl:    KLOR-CON M20 20 MEQ tablet, Take 40 mEq by mouth daily. , Disp: , Rfl:    L-Methylfolate-Algae (L-METHYLFOLATE FORTE) 15-90.314 MG CAPS, Take 1 tablet by mouth at bedtime., Disp: , Rfl:    lithium carbonate (ESKALITH) 450 MG CR tablet, Take 900 mg by mouth at bedtime., Disp: , Rfl:    loperamide (IMODIUM A-D) 2 MG capsule, Take 2 mg by mouth 3 (three) times daily., Disp: , Rfl:    metFORMIN (GLUCOPHAGE-XR) 500 MG 24 hr tablet, Take 1,000 mg by mouth 2 (two) times daily., Disp: , Rfl:    MOUNJARO 7.5 MG/0.5ML Pen, Inject 7.5 mg into the skin once a week., Disp: , Rfl:    NOVOLOG FLEXPEN 100 UNIT/ML FlexPen, Inject 4-10 Units into the skin 3 (three) times daily., Disp: , Rfl:    omeprazole (PRILOSEC) 40 MG capsule, TAKE 1 CAPSULE BY MOUTH TWICE A DAY, Disp: 180 capsule, Rfl: 1   pravastatin (PRAVACHOL) 40 MG tablet, Take 40 mg by mouth daily., Disp: , Rfl:    SPRAVATO, 84 MG DOSE, 28 MG/DEVICE SOPK, Place 3 sprays into both nostrils once a week. On Wednesday, Disp: , Rfl:    telmisartan (MICARDIS) 40 MG tablet, Take 40 mg by mouth daily., Disp: , Rfl:    traZODone (DESYREL) 50 MG tablet, Take 200 mg by mouth at bedtime. 4 tablets at night, Disp: , Rfl:    triamcinolone (NASACORT) 55 MCG/ACT AERO nasal inhaler, Place 2 sprays into the nose daily., Disp: , Rfl:    triamcinolone cream (KENALOG) 0.1 %, Apply 1 Application topically 2 (two) times daily., Disp: 30 g, Rfl: 0   TRINTELLIX 20 MG TABS tablet, Take 20 mg by mouth daily., Disp: , Rfl: 3   VRAYLAR 1.5 MG capsule, Take 1.5 mg by mouth daily., Disp: , Rfl:    zaleplon  (SONATA) 10 MG capsule, Take 20 mg by mouth at bedtime., Disp: , Rfl:  No current facility-administered medications for this visit.  Facility-Administered Medications Ordered in Other Visits:    gadopentetate dimeglumine (MAGNEVIST) injection 14 mL, 14 mL, Intravenous, Once PRN, Anson Fret, MD   Vital signs in last 24 hrs: Vitals:   04/04/23 0850  BP: 104/68  Pulse: 84   Wt Readings from Last 3 Encounters:  04/04/23 155 lb 6 oz (70.5 kg)  02/09/23 156 lb 9.6 oz (71 kg)  12/05/22 163 lb (73.9 kg)  Note weight stable last 2 months  Physical Exam  General: well-appearing, pleasant and restricted affect as before Eyes: sclera anicteric, no redness ENT: oral mucosa moist without lesions, no cervical or supraclavicular lymphadenopathy CV: RRR, no JVD, no peripheral edema Resp: clear to auscultation bilaterally, normal RR and effort noted GI: soft, no tenderness, with active bowel sounds. No guarding or palpable organomegaly noted.  No bruit Skin; warm and dry, no rash or jaundice noted. Lap band port felt on the abdominal wall. Neuro: awake, alert and oriented x 3. Normal gross motor function and fluent speech  Labs:   ___________________________________________ Radiologic studies:   ____________________________________________ Other:   _____________________________________________ Assessment & Plan  Assessment: Chronic diarrhea  Acute constipation  Exocrine pancreatic insufficiency  Overall clinical picture still somewhat puzzling.  I am not yet convinced she has EPI.  She did not have microscopic colitis in similar circumstances on prior colonoscopy.  It is difficult to tell for certain if she had improvement in the diarrhea after rifaximin because she also seems to have started [facility] about the same time.  Her GLP-1 agonist seems likely be contributing to the constipation, now made worse because she has continue the Imodium with every meal. The metformin was  probably not causing the diarrhea she described earlier this year because she has been on it for so many years.  Nevertheless, I wonder if she still needs it if the [facility] is controlling her diabetes.  This would be a question for her endocrinologist, and I will copy my note to them.  I also do not know if she still needs iron supplements, as the anemia was diagnosed in 2021 and periodically followed by primary care who also prescribes her iron supplement.  Plan: -Advised to discontinue Imodium  -Discontinue Creon for now -Fecal Elastase stool test in 2 weeks  -Contact PCP and endocrinology to discuss medications   30 minutes were spent on this encounter (including chart review, history/exam, counseling/coordination of care, and documentation) > 50% of that time was spent on counseling and coordination of care.    I,Safa M Kadhim,acting as a scribe for [name] III, MD.,have documented all relevant documentation on the behalf of Amy Rist, MD,as directed by  Amy Rist, MD while in the presence of Amy Rist, MD.   Marvis Repress III, MD, have reviewed all documentation for this visit. The documentation on 04/04/23 for the exam, diagnosis, procedures, and orders are all accurate and complete.

## 2023-04-04 NOTE — Progress Notes (Deleted)
Amy [name] Progress Note  Chief Complaint: ***  Subjective  History: Chronic diarrhea and question of pancreatic insufficiency, last office visit was extensive clinical details on 12/05/2022. Amy [name] had no abnormalities of the pancreas on 2022 imaging and no risk factors for EPI, though her fecal elastase was low at 42 earlier this year.  I wonder whether that could have had some dilutional effect from other causes of diarrhea.  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 ROS: Cardiovascular:  no chest pain Respiratory: no dyspnea  The patient's Past Medical, Family and Social History were reviewed and are on file in the EMR.  Objective:  Med list reviewed  Current Outpatient Medications:    Acetaminophen (TYLENOL PO), Take 500 mg by mouth as needed., Disp: , Rfl:    amLODipine (NORVASC) 2.5 MG tablet, Take 2.5 mg by mouth daily., Disp: , Rfl:    Armodafinil 250 MG tablet, Take 250 mg by mouth every morning., Disp: , Rfl:    cetirizine (ZYRTEC) 10 MG tablet, Take 10 mg by mouth daily., Disp: , Rfl:    Continuous Glucose Sensor (DEXCOM G7 SENSOR) MISC, , Disp: , Rfl:    CREON 36000-114000 units CPEP capsule, Take 36,000 Units by mouth 3 (three) times daily before meals., Disp: , Rfl:    Cyanocobalamin 1000 MCG SUBL, Take 1 tablet by mouth daily., Disp: , Rfl:    ergocalciferol (VITAMIN D2) 50000 UNITS capsule, Take 50,000 Units by mouth every Monday., Disp: , Rfl:    FERREX 150 150 MG capsule, Take 150 mg by mouth daily., Disp: , Rfl: 1   furosemide (LASIX) 40 MG tablet, Take 40 mg by mouth daily., Disp: , Rfl:    gabapentin (NEURONTIN) 300 MG capsule, Take 300 mg by mouth 3 (three) times daily., Disp: , Rfl:    HYDROcodone-acetaminophen  (NORCO/VICODIN) 5-325 MG tablet, Take 1 tablet by mouth daily as needed for pain., Disp: , Rfl:    hydroquinone 4 % cream, Apply 1 Application topically at bedtime., Disp: , Rfl:    INGREZZA 40 MG capsule, Take 40 mg by mouth daily., Disp: , Rfl:    Insulin Glargine (BASAGLAR KWIKPEN) 100 UNIT/ML, Inject 26 Units into the skin at bedtime., Disp: , Rfl:    Insulin Pen Needle (FIFTY50 PEN NEEDLES) 31G X 5 MM MISC, BD Ultra-Fine Mini Pen Needle 31 gauge x 3/16", Disp: , Rfl:    JORNAY PM 80 MG CP24, Take 1 capsule by mouth at bedtime., Disp: , Rfl:    KLOR-CON M20 20 MEQ tablet, Take 40 mEq by mouth daily. , Disp: , Rfl:    L-Methylfolate-Algae (L-METHYLFOLATE FORTE) 15-90.314 MG CAPS, Take 1 tablet by mouth at bedtime., Disp: , Rfl:    lithium carbonate (ESKALITH) 450 MG CR tablet, Take 900 mg by mouth at bedtime., Disp: , Rfl:    metFORMIN (GLUCOPHAGE-XR) 500 MG 24 hr tablet, Take 1,000 mg by mouth 2 (two) times daily., Disp: , Rfl:    NOVOLOG FLEXPEN 100 UNIT/ML FlexPen, Inject 4-10 Units into the skin 3 (three) times daily., Disp: , Rfl:    omeprazole (PRILOSEC) 40 MG capsule, TAKE 1 CAPSULE BY MOUTH TWICE A DAY, Disp: 180 capsule, Rfl: 1   pravastatin (PRAVACHOL) 40  MG tablet, Take 40 mg by mouth daily., Disp: , Rfl:    SPRAVATO, 84 MG DOSE, 28 MG/DEVICE SOPK, Place 3 sprays into both nostrils once a week. On Wednesday, Disp: , Rfl:    telmisartan (MICARDIS) 40 MG tablet, Take 40 mg by mouth daily., Disp: , Rfl:    traZODone (DESYREL) 50 MG tablet, Take 200 mg by mouth at bedtime. 4 tablets at night, Disp: , Rfl:    triamcinolone (NASACORT) 55 MCG/ACT AERO nasal inhaler, Place 2 sprays into the nose daily., Disp: , Rfl:    triamcinolone cream (KENALOG) 0.1 %, Apply 1 Application topically daily., Disp: , Rfl:    triamcinolone cream (KENALOG) 0.1 %, Apply 1 Application topically 2 (two) times daily., Disp: 30 g, Rfl: 0   TRINTELLIX 20 MG TABS tablet, Take 20 mg by mouth daily., Disp: , Rfl: 3    VRAYLAR 1.5 MG capsule, Take 1.5 mg by mouth daily., Disp: , Rfl:    zaleplon (SONATA) 10 MG capsule, Take 20 mg by mouth at bedtime., Disp: , Rfl:    Azelastine HCl 137 MCG/SPRAY SOLN, Place 1 spray into the nose daily., Disp: , Rfl:    loperamide (IMODIUM A-D) 2 MG capsule, Take 2 mg by mouth 3 (three) times daily., Disp: , Rfl:    MOUNJARO 7.5 MG/0.5ML Pen, Inject 7.5 mg into the skin once a week., Disp: , Rfl:  No current facility-administered medications for this visit.  Facility-Administered Medications Ordered in Other Visits:    gadopentetate dimeglumine (MAGNEVIST) injection 14 mL, 14 mL, Intravenous, Once PRN, Anson Fret, MD   Vital signs in last 24 hrs: There were no vitals filed for this visit. Wt Readings from Last 3 Encounters:  04/04/23 155 lb 6 oz (70.5 kg)  02/09/23 156 lb 9.6 oz (71 kg)  12/05/22 163 lb (73.9 kg)    Physical Exam  *** HEENT: sclera anicteric, oral mucosa moist without lesions Neck: supple, no thyromegaly, JVD or lymphadenopathy Cardiac: ***,  no peripheral edema Pulm: clear to auscultation bilaterally, normal RR and effort noted Abdomen: soft, *** tenderness, with active bowel sounds. No guarding or palpable hepatosplenomegaly. Skin; warm and dry, no jaundice or rash  Labs:   ___________________________________________ Radiologic studies:   ____________________________________________ Other:   _____________________________________________ Assessment & Plan  Assessment: No diagnosis found.    Plan:   *** minutes were spent on this encounter (including chart review, history/exam, counseling/coordination of care, and documentation) > 50% of that time was spent on counseling and coordination of care.   [name] III

## 2023-04-05 DIAGNOSIS — Z124 Encounter for screening for malignant neoplasm of cervix: Secondary | ICD-10-CM | POA: Diagnosis not present

## 2023-04-05 DIAGNOSIS — Z01419 Encounter for gynecological examination (general) (routine) without abnormal findings: Secondary | ICD-10-CM | POA: Diagnosis not present

## 2023-04-05 DIAGNOSIS — Z1151 Encounter for screening for human papillomavirus (HPV): Secondary | ICD-10-CM | POA: Diagnosis not present

## 2023-04-06 DIAGNOSIS — U071 COVID-19: Secondary | ICD-10-CM | POA: Diagnosis not present

## 2023-04-06 DIAGNOSIS — R0981 Nasal congestion: Secondary | ICD-10-CM | POA: Diagnosis not present

## 2023-04-06 DIAGNOSIS — R6883 Chills (without fever): Secondary | ICD-10-CM | POA: Diagnosis not present

## 2023-04-06 DIAGNOSIS — R059 Cough, unspecified: Secondary | ICD-10-CM | POA: Diagnosis not present

## 2023-04-11 DIAGNOSIS — F332 Major depressive disorder, recurrent severe without psychotic features: Secondary | ICD-10-CM | POA: Diagnosis not present

## 2023-04-18 ENCOUNTER — Other Ambulatory Visit: Payer: Medicare Other

## 2023-04-18 DIAGNOSIS — K8681 Exocrine pancreatic insufficiency: Secondary | ICD-10-CM

## 2023-04-18 DIAGNOSIS — K529 Noninfective gastroenteritis and colitis, unspecified: Secondary | ICD-10-CM

## 2023-04-18 DIAGNOSIS — F332 Major depressive disorder, recurrent severe without psychotic features: Secondary | ICD-10-CM | POA: Diagnosis not present

## 2023-04-18 DIAGNOSIS — K5900 Constipation, unspecified: Secondary | ICD-10-CM

## 2023-04-24 ENCOUNTER — Other Ambulatory Visit: Payer: Self-pay | Admitting: Physician Assistant

## 2023-04-25 ENCOUNTER — Telehealth: Payer: Self-pay | Admitting: Gastroenterology

## 2023-04-25 ENCOUNTER — Other Ambulatory Visit: Payer: Self-pay

## 2023-04-25 DIAGNOSIS — F332 Major depressive disorder, recurrent severe without psychotic features: Secondary | ICD-10-CM | POA: Diagnosis not present

## 2023-04-25 LAB — PANCREATIC ELASTASE, FECAL: Pancreatic Elastase-1, Stool: 319 ug/g

## 2023-04-25 MED ORDER — PANCRELIPASE (LIP-PROT-AMYL) 36000-114000 UNITS PO CPEP: ORAL_CAPSULE | ORAL | 1 refills | Status: DC

## 2023-04-25 NOTE — Telephone Encounter (Signed)
Inbound call from patient requesting medication refill on creon.   Patient would like medication sent into [facility] because [facility] is out of the medication. Please advise.   Thank you

## 2023-04-25 NOTE — Telephone Encounter (Signed)
Rx refill request was requested to be sent to [facility] at [employer].

## 2023-05-02 ENCOUNTER — Telehealth: Payer: Self-pay | Admitting: Gastroenterology

## 2023-05-02 DIAGNOSIS — F332 Major depressive disorder, recurrent severe without psychotic features: Secondary | ICD-10-CM | POA: Diagnosis not present

## 2023-05-02 NOTE — Telephone Encounter (Signed)
Inbound call from patient requesting a call to discuss 8/21 lab results. Please advise, thank you.

## 2023-05-02 NOTE — Telephone Encounter (Signed)
Returned call to patient. We reviewed 8/29 MyChart message that Dr. Myrtie Neither sent. Pt states that she stopped Creon after her office visit for about 2 weeks and the diarrhea returned. Pt states that she resumed Creon 2 capsules with meals and 1 Imodium with meals as well and this seems to control her diarrhea. There has been no change in her [facility] prescription, she still takes this as previously prescribed. Pt states that when she does not take Creon or Imodium she will have 2 episodes of diarrhea in a day. First stool is typically light in volume, pt reports that she is mostly incontinent at night. Pt is aware that you are out of the office until next week. Pt states that she will wait for your advice, pt is managing symptoms on current regimen of creon and Imodium. Thanks

## 2023-05-03 ENCOUNTER — Other Ambulatory Visit: Payer: Self-pay | Admitting: Gastroenterology

## 2023-05-07 NOTE — Telephone Encounter (Signed)
Thank you for the update.  In light of that, it seems that she probably does have pancreatic enzyme insufficiency despite having had a normal fecal elastase level, so I am agreeable to her continuing the Creon at current dose.  I would just like to reiterate caution with the Imodium so she does not develop constipation like she had when I last saw her, particularly because Greggory Keen can also cause constipation.  Definitely take the Imodium before bedtime to avoid any overnight diarrhea.  I will see her in clinic as scheduled.  Ellwood Dense

## 2023-05-08 DIAGNOSIS — F332 Major depressive disorder, recurrent severe without psychotic features: Secondary | ICD-10-CM | POA: Diagnosis not present

## 2023-05-08 NOTE — Telephone Encounter (Signed)
Called and spoke with patient regarding recommendations below. Pt is aware of her follow up appt in November. Pt verbalized understanding of all information and had no concerns at the end of the call.

## 2023-05-14 DIAGNOSIS — G252 Other specified forms of tremor: Secondary | ICD-10-CM | POA: Diagnosis not present

## 2023-05-14 DIAGNOSIS — F3341 Major depressive disorder, recurrent, in partial remission: Secondary | ICD-10-CM | POA: Diagnosis not present

## 2023-05-14 DIAGNOSIS — F900 Attention-deficit hyperactivity disorder, predominantly inattentive type: Secondary | ICD-10-CM | POA: Diagnosis not present

## 2023-05-16 DIAGNOSIS — F332 Major depressive disorder, recurrent severe without psychotic features: Secondary | ICD-10-CM | POA: Diagnosis not present

## 2023-05-23 DIAGNOSIS — F332 Major depressive disorder, recurrent severe without psychotic features: Secondary | ICD-10-CM | POA: Diagnosis not present

## 2023-05-30 DIAGNOSIS — F332 Major depressive disorder, recurrent severe without psychotic features: Secondary | ICD-10-CM | POA: Diagnosis not present

## 2023-06-06 DIAGNOSIS — F332 Major depressive disorder, recurrent severe without psychotic features: Secondary | ICD-10-CM | POA: Diagnosis not present

## 2023-06-13 DIAGNOSIS — F332 Major depressive disorder, recurrent severe without psychotic features: Secondary | ICD-10-CM | POA: Diagnosis not present

## 2023-06-20 DIAGNOSIS — F332 Major depressive disorder, recurrent severe without psychotic features: Secondary | ICD-10-CM | POA: Diagnosis not present

## 2023-06-27 DIAGNOSIS — F332 Major depressive disorder, recurrent severe without psychotic features: Secondary | ICD-10-CM | POA: Diagnosis not present

## 2023-07-03 ENCOUNTER — Ambulatory Visit (INDEPENDENT_AMBULATORY_CARE_PROVIDER_SITE_OTHER): Payer: Medicare Other | Admitting: Gastroenterology

## 2023-07-03 ENCOUNTER — Encounter: Payer: Self-pay | Admitting: Gastroenterology

## 2023-07-03 VITALS — BP 118/70 | HR 71 | Ht 64.0 in | Wt 155.0 lb

## 2023-07-03 DIAGNOSIS — K529 Noninfective gastroenteritis and colitis, unspecified: Secondary | ICD-10-CM | POA: Diagnosis not present

## 2023-07-03 DIAGNOSIS — K8681 Exocrine pancreatic insufficiency: Secondary | ICD-10-CM

## 2023-07-03 NOTE — Patient Instructions (Signed)
We are going to give you a call to get you scheduled for a 6 month follow up.   _______________________________________________________  If your blood pressure at your visit was 140/90 or greater, please contact your primary care physician to follow up on this.  _______________________________________________________  If you are age 65 or older, your body mass index should be between 23-30. Your Body mass index is 26.61 kg/m. If this is out of the aforementioned range listed, please consider follow up with your Primary Care Provider.  If you are age 89 or younger, your body mass index should be between 19-25. Your Body mass index is 26.61 kg/m. If this is out of the aformentioned range listed, please consider follow up with your Primary Care Provider.   ________________________________________________________  The [facility] providers would like to encourage you to use [facility] to communicate with providers for non-urgent requests or questions.  Due to long hold times on the telephone, sending your provider a message by [facility] may be a faster and more efficient way to get a response.  Please allow 48 business hours for a response.  Please remember that this is for non-urgent requests.  _______________________________________________________ It was a pleasure to see you today!  Thank you for trusting me with your gastrointestinal care!

## 2023-07-03 NOTE — Progress Notes (Signed)
[facility] Progress Note  Chief Complaint: EPI  Subjective  Prior history  Summary of clinical history in 04/24/2023 office note:  Chronic diarrhea and question of pancreatic insufficiency, last office visit was extensive clinical details on 12/05/2022. [name] had no abnormalities of the pancreas on 2022 imaging and no risk factors for EPI, though her fecal elastase was low at 42 earlier this year.  I wonder whether that could have had some dilutional effect from other causes of diarrhea.  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  Overall clinical picture still somewhat puzzling.  I am not yet convinced she has EPI.  She did not have microscopic colitis in similar circumstances on prior colonoscopy.  It is difficult to tell for certain if she had improvement in the diarrhea after rifaximin because she also seems to have started [facility] about the same time.  Her GLP-1 agonist seems likely be contributing to the constipation, now made worse because she has continue the Imodium with every meal. The metformin was probably not causing the diarrhea she described earlier this year because she has been on it for so many years.  Nevertheless, I wonder if she still needs it if the [facility] is controlling her diabetes.  This would be a question for her endocrinologist, and I will copy my note to them.   I also do not know if she still needs iron supplements, as the anemia was diagnosed in 2021 and periodically followed by primary care who also prescribes her iron supplement."  Fecal elastase was normal after being off Creon for 2 weeks.  Subsequent phone note indicates patient complained of recurrent diarrhea, and thus was put back on Creon.   Discussed the use of AI  scribe software for clinical note transcription with the patient, who gave verbal consent to proceed.  History of Present Illness   Amy [name], a patient with a history of pancreatic insufficiency, presented with concerns about persistent loose, liquid stools despite taking pancreatic enzymes and Imodium. After discontinuing the pancreatic enzymes for a brief period, the patient reported a rapid return of diarrhea within a day. Currently, the patient is taking two to three tablets of pancreatic enzymes with each meal, which seems to control the diarrhea. However, the stools remain mostly liquid in consistency, with occasional formation. The patient also takes one Imodium tablet at night to prevent nocturnal diarrhea, which appears to be effective.  The patient's appetite remains good, and weight fluctuates but is generally stable. Blood sugar control, in the context of diabetes management with Monjuaro, is described as variable. The patient has no issues with swallowing or food passage since the deflation of a previous lap band device, and denies any vomiting.  The patient also reported a pattern of needing to use the bathroom shortly after consuming a heavy meal.        ROS: Cardiovascular:  no chest pain Respiratory: no dyspnea  The patient's Past Medical, Family and Social History were reviewed and are on file in the EMR.  Objective:  Med list reviewed  Current Outpatient Medications:    Acetaminophen (TYLENOL PO), Take 500 mg by mouth as needed., Disp: , Rfl:    amLODipine (NORVASC) 2.5 MG tablet, Take 2.5 mg  by mouth daily., Disp: , Rfl:    Armodafinil 250 MG tablet, Take 250 mg by mouth every morning., Disp: , Rfl:    Azelastine HCl 137 MCG/SPRAY SOLN, Place 1 spray into the nose daily., Disp: , Rfl:    cetirizine (ZYRTEC) 10 MG tablet, Take 10 mg by mouth daily., Disp: , Rfl:    Continuous Glucose Sensor (DEXCOM G7 SENSOR) MISC, , Disp: , Rfl:    Cyanocobalamin 1000 MCG SUBL, Take 1  tablet by mouth daily., Disp: , Rfl:    ergocalciferol (VITAMIN D2) 50000 UNITS capsule, Take 50,000 Units by mouth every Monday., Disp: , Rfl:    FERREX 150 150 MG capsule, Take 150 mg by mouth daily., Disp: , Rfl: 1   furosemide (LASIX) 40 MG tablet, Take 40 mg by mouth daily., Disp: , Rfl:    gabapentin (NEURONTIN) 300 MG capsule, Take 300 mg by mouth 3 (three) times daily., Disp: , Rfl:    HYDROcodone-acetaminophen (NORCO/VICODIN) 5-325 MG tablet, Take 1 tablet by mouth daily as needed for pain., Disp: , Rfl:    hydroquinone 4 % cream, Apply 1 Application topically at bedtime., Disp: , Rfl:    INGREZZA 40 MG capsule, Take 40 mg by mouth daily., Disp: , Rfl:    Insulin Glargine (BASAGLAR KWIKPEN) 100 UNIT/ML, Inject 26 Units into the skin at bedtime., Disp: , Rfl:    Insulin Pen Needle (FIFTY50 PEN NEEDLES) 31G X 5 MM MISC, BD Ultra-Fine Mini Pen Needle 31 gauge x 3/16", Disp: , Rfl:    JORNAY PM 80 MG CP24, Take 1 capsule by mouth at bedtime., Disp: , Rfl:    KLOR-CON M20 20 MEQ tablet, Take 40 mEq by mouth daily. , Disp: , Rfl:    L-Methylfolate-Algae (L-METHYLFOLATE FORTE) 15-90.314 MG CAPS, Take 1 tablet by mouth at bedtime., Disp: , Rfl:    lipase/protease/amylase (CREON) 36000 UNITS CPEP capsule, TAKE 2 CAPSULES 3 TIMES DAILY WITH MEALS. MAY ALSO TAKE 1 CAPSULE AS NEEDED (WITH SNACKS)., Disp: 720 capsule, Rfl: 1   lithium carbonate (ESKALITH) 450 MG CR tablet, Take 900 mg by mouth at bedtime., Disp: , Rfl:    loperamide (IMODIUM A-D) 2 MG capsule, Take 2 mg by mouth 3 (three) times daily., Disp: , Rfl:    metFORMIN (GLUCOPHAGE-XR) 500 MG 24 hr tablet, Take 1,000 mg by mouth 2 (two) times daily., Disp: , Rfl:    MOUNJARO 7.5 MG/0.5ML Pen, Inject 7.5 mg into the skin once a week., Disp: , Rfl:    NOVOLOG FLEXPEN 100 UNIT/ML FlexPen, Inject 4-10 Units into the skin 3 (three) times daily., Disp: , Rfl:    omeprazole (PRILOSEC) 40 MG capsule, TAKE 1 CAPSULE BY MOUTH TWICE A DAY, Disp: 180  capsule, Rfl: 1   pravastatin (PRAVACHOL) 40 MG tablet, Take 40 mg by mouth daily., Disp: , Rfl:    SPRAVATO, 84 MG DOSE, 28 MG/DEVICE SOPK, Place 3 sprays into both nostrils once a week. On Wednesday, Disp: , Rfl:    telmisartan (MICARDIS) 40 MG tablet, Take 40 mg by mouth daily., Disp: , Rfl:    traZODone (DESYREL) 50 MG tablet, Take 200 mg by mouth at bedtime. 4 tablets at night, Disp: , Rfl:    triamcinolone (NASACORT) 55 MCG/ACT AERO nasal inhaler, Place 2 sprays into the nose daily., Disp: , Rfl:    triamcinolone cream (KENALOG) 0.1 %, Apply 1 Application topically 2 (two) times daily., Disp: 30 g, Rfl: 0   TRINTELLIX 20 MG TABS tablet, Take 20  mg by mouth daily., Disp: , Rfl: 3   VRAYLAR 1.5 MG capsule, Take 1.5 mg by mouth daily., Disp: , Rfl:    zaleplon (SONATA) 10 MG capsule, Take 20 mg by mouth at bedtime., Disp: , Rfl:  No current facility-administered medications for this visit.  Facility-Administered Medications Ordered in Other Visits:    gadopentetate dimeglumine (MAGNEVIST) injection 14 mL, 14 mL, Intravenous, Once PRN, Anson Fret, MD   Vital signs in last 24 hrs: Vitals:   07/03/23 0908  BP: 118/70  Pulse: 71   Wt Readings from Last 3 Encounters:  07/03/23 155 lb (70.3 kg)  04/04/23 155 lb 6 oz (70.5 kg)  02/09/23 156 lb 9.6 oz (71 kg)    Physical Exam  Well-appearing Cardiac: Regular,  no peripheral edema Pulm: clear to auscultation bilaterally, normal RR and effort noted Abdomen: soft, no tenderness, with active bowel sounds. No guarding or palpable hepatosplenomegaly.    Labs:   ___________________________________________ Radiologic studies:   ____________________________________________ Other: Fecal elastase level on file, noted above  _____________________________________________ Assessment & Plan  Assessment: Encounter Diagnoses  Name Primary?   Chronic diarrhea Yes   Exocrine pancreatic insufficiency     Assessment and Plan     The intermittent postprandial need for bowel movement sounds like some underlying IBS, may be related to the EPI, perhaps side effect of GLP-1 agonist medicine.  Pancreatic insufficiency Diarrhea returned within a day of stopping pancreatic enzymes. Currently taking two Creon tablets with each meal, which controls diarrhea. Stools are often loose and liquidy. -Continue current dose of Creon. -Consider additional dose of Imodium as needed, with caution to avoid constipation.   Diabetes Blood sugar control is variable. Currently on Mijaro with no reported side effects. -Continue GLP-1 agonist as prescribed.  Follow-up in approximately six months, or sooner if any problems arise.       25 minutes were spent on this encounter (including chart review, history/exam, counseling/coordination of care, and documentation) > 50% of that time was spent on counseling and coordination of care.   [name] III

## 2023-07-04 DIAGNOSIS — F332 Major depressive disorder, recurrent severe without psychotic features: Secondary | ICD-10-CM | POA: Diagnosis not present

## 2023-07-09 DIAGNOSIS — F332 Major depressive disorder, recurrent severe without psychotic features: Secondary | ICD-10-CM | POA: Diagnosis not present

## 2023-07-09 DIAGNOSIS — F900 Attention-deficit hyperactivity disorder, predominantly inattentive type: Secondary | ICD-10-CM | POA: Diagnosis not present

## 2023-07-11 DIAGNOSIS — F332 Major depressive disorder, recurrent severe without psychotic features: Secondary | ICD-10-CM | POA: Diagnosis not present

## 2023-07-17 DIAGNOSIS — Z23 Encounter for immunization: Secondary | ICD-10-CM | POA: Diagnosis not present

## 2023-07-17 DIAGNOSIS — Z79899 Other long term (current) drug therapy: Secondary | ICD-10-CM | POA: Diagnosis not present

## 2023-07-17 DIAGNOSIS — I700 Atherosclerosis of aorta: Secondary | ICD-10-CM | POA: Diagnosis not present

## 2023-07-17 DIAGNOSIS — F3175 Bipolar disorder, in partial remission, most recent episode depressed: Secondary | ICD-10-CM | POA: Diagnosis not present

## 2023-07-17 DIAGNOSIS — E559 Vitamin D deficiency, unspecified: Secondary | ICD-10-CM | POA: Diagnosis not present

## 2023-07-17 DIAGNOSIS — E538 Deficiency of other specified B group vitamins: Secondary | ICD-10-CM | POA: Diagnosis not present

## 2023-07-17 DIAGNOSIS — I10 Essential (primary) hypertension: Secondary | ICD-10-CM | POA: Diagnosis not present

## 2023-07-17 DIAGNOSIS — E1142 Type 2 diabetes mellitus with diabetic polyneuropathy: Secondary | ICD-10-CM | POA: Diagnosis not present

## 2023-07-17 DIAGNOSIS — E785 Hyperlipidemia, unspecified: Secondary | ICD-10-CM | POA: Diagnosis not present

## 2023-07-17 DIAGNOSIS — D509 Iron deficiency anemia, unspecified: Secondary | ICD-10-CM | POA: Diagnosis not present

## 2023-07-18 DIAGNOSIS — F332 Major depressive disorder, recurrent severe without psychotic features: Secondary | ICD-10-CM | POA: Diagnosis not present

## 2023-07-25 DIAGNOSIS — F332 Major depressive disorder, recurrent severe without psychotic features: Secondary | ICD-10-CM | POA: Diagnosis not present

## 2023-08-01 DIAGNOSIS — F332 Major depressive disorder, recurrent severe without psychotic features: Secondary | ICD-10-CM | POA: Diagnosis not present

## 2023-08-08 DIAGNOSIS — F332 Major depressive disorder, recurrent severe without psychotic features: Secondary | ICD-10-CM | POA: Diagnosis not present

## 2023-08-13 DIAGNOSIS — E7800 Pure hypercholesterolemia, unspecified: Secondary | ICD-10-CM | POA: Diagnosis not present

## 2023-08-13 DIAGNOSIS — E1165 Type 2 diabetes mellitus with hyperglycemia: Secondary | ICD-10-CM | POA: Diagnosis not present

## 2023-08-15 DIAGNOSIS — F332 Major depressive disorder, recurrent severe without psychotic features: Secondary | ICD-10-CM | POA: Diagnosis not present

## 2023-08-15 DIAGNOSIS — E7800 Pure hypercholesterolemia, unspecified: Secondary | ICD-10-CM | POA: Diagnosis not present

## 2023-08-15 DIAGNOSIS — I10 Essential (primary) hypertension: Secondary | ICD-10-CM | POA: Diagnosis not present

## 2023-08-15 DIAGNOSIS — E1140 Type 2 diabetes mellitus with diabetic neuropathy, unspecified: Secondary | ICD-10-CM | POA: Diagnosis not present

## 2023-08-15 DIAGNOSIS — E1165 Type 2 diabetes mellitus with hyperglycemia: Secondary | ICD-10-CM | POA: Diagnosis not present

## 2023-08-16 DIAGNOSIS — G4710 Hypersomnia, unspecified: Secondary | ICD-10-CM | POA: Diagnosis not present

## 2023-08-16 DIAGNOSIS — F411 Generalized anxiety disorder: Secondary | ICD-10-CM | POA: Diagnosis not present

## 2023-08-16 DIAGNOSIS — F3341 Major depressive disorder, recurrent, in partial remission: Secondary | ICD-10-CM | POA: Diagnosis not present

## 2023-08-16 DIAGNOSIS — F900 Attention-deficit hyperactivity disorder, predominantly inattentive type: Secondary | ICD-10-CM | POA: Diagnosis not present

## 2023-08-30 DIAGNOSIS — F332 Major depressive disorder, recurrent severe without psychotic features: Secondary | ICD-10-CM | POA: Diagnosis not present

## 2023-09-04 DIAGNOSIS — I10 Essential (primary) hypertension: Secondary | ICD-10-CM | POA: Diagnosis not present

## 2023-09-04 DIAGNOSIS — E785 Hyperlipidemia, unspecified: Secondary | ICD-10-CM | POA: Diagnosis not present

## 2023-09-04 DIAGNOSIS — M791 Myalgia, unspecified site: Secondary | ICD-10-CM | POA: Diagnosis not present

## 2023-09-04 DIAGNOSIS — Z0000 Encounter for general adult medical examination without abnormal findings: Secondary | ICD-10-CM | POA: Diagnosis not present

## 2023-09-04 DIAGNOSIS — I700 Atherosclerosis of aorta: Secondary | ICD-10-CM | POA: Diagnosis not present

## 2023-09-04 DIAGNOSIS — E1142 Type 2 diabetes mellitus with diabetic polyneuropathy: Secondary | ICD-10-CM | POA: Diagnosis not present

## 2023-09-05 DIAGNOSIS — F332 Major depressive disorder, recurrent severe without psychotic features: Secondary | ICD-10-CM | POA: Diagnosis not present

## 2023-09-12 DIAGNOSIS — F332 Major depressive disorder, recurrent severe without psychotic features: Secondary | ICD-10-CM | POA: Diagnosis not present

## 2023-09-17 DIAGNOSIS — E119 Type 2 diabetes mellitus without complications: Secondary | ICD-10-CM | POA: Diagnosis not present

## 2023-09-19 DIAGNOSIS — F332 Major depressive disorder, recurrent severe without psychotic features: Secondary | ICD-10-CM | POA: Diagnosis not present

## 2023-09-25 DIAGNOSIS — M542 Cervicalgia: Secondary | ICD-10-CM | POA: Diagnosis not present

## 2023-09-26 DIAGNOSIS — F332 Major depressive disorder, recurrent severe without psychotic features: Secondary | ICD-10-CM | POA: Diagnosis not present

## 2023-09-29 DIAGNOSIS — M48061 Spinal stenosis, lumbar region without neurogenic claudication: Secondary | ICD-10-CM | POA: Diagnosis not present

## 2023-09-29 DIAGNOSIS — M542 Cervicalgia: Secondary | ICD-10-CM | POA: Diagnosis not present

## 2023-09-29 DIAGNOSIS — M4802 Spinal stenosis, cervical region: Secondary | ICD-10-CM | POA: Diagnosis not present

## 2023-09-29 DIAGNOSIS — M5030 Other cervical disc degeneration, unspecified cervical region: Secondary | ICD-10-CM | POA: Diagnosis not present

## 2023-10-02 DIAGNOSIS — M542 Cervicalgia: Secondary | ICD-10-CM | POA: Diagnosis not present

## 2023-10-03 DIAGNOSIS — F332 Major depressive disorder, recurrent severe without psychotic features: Secondary | ICD-10-CM | POA: Diagnosis not present

## 2023-10-05 DIAGNOSIS — M542 Cervicalgia: Secondary | ICD-10-CM | POA: Diagnosis not present

## 2023-10-08 ENCOUNTER — Other Ambulatory Visit: Payer: Self-pay | Admitting: Physician Assistant

## 2023-10-08 MED ORDER — PANCRELIPASE (LIP-PROT-AMYL) 36000-114000 UNITS PO CPEP: ORAL_CAPSULE | ORAL | 1 refills | Status: DC

## 2023-10-10 DIAGNOSIS — F332 Major depressive disorder, recurrent severe without psychotic features: Secondary | ICD-10-CM | POA: Diagnosis not present

## 2023-10-17 DIAGNOSIS — F332 Major depressive disorder, recurrent severe without psychotic features: Secondary | ICD-10-CM | POA: Diagnosis not present

## 2023-10-24 ENCOUNTER — Other Ambulatory Visit: Payer: Self-pay | Admitting: Gastroenterology

## 2023-10-24 DIAGNOSIS — F332 Major depressive disorder, recurrent severe without psychotic features: Secondary | ICD-10-CM | POA: Diagnosis not present

## 2023-10-31 DIAGNOSIS — F332 Major depressive disorder, recurrent severe without psychotic features: Secondary | ICD-10-CM | POA: Diagnosis not present

## 2023-11-07 DIAGNOSIS — F332 Major depressive disorder, recurrent severe without psychotic features: Secondary | ICD-10-CM | POA: Diagnosis not present

## 2023-11-08 DIAGNOSIS — F3341 Major depressive disorder, recurrent, in partial remission: Secondary | ICD-10-CM | POA: Diagnosis not present

## 2023-11-14 DIAGNOSIS — F332 Major depressive disorder, recurrent severe without psychotic features: Secondary | ICD-10-CM | POA: Diagnosis not present

## 2023-11-21 DIAGNOSIS — F332 Major depressive disorder, recurrent severe without psychotic features: Secondary | ICD-10-CM | POA: Diagnosis not present

## 2023-11-28 DIAGNOSIS — F332 Major depressive disorder, recurrent severe without psychotic features: Secondary | ICD-10-CM | POA: Diagnosis not present

## 2023-12-05 DIAGNOSIS — F332 Major depressive disorder, recurrent severe without psychotic features: Secondary | ICD-10-CM | POA: Diagnosis not present

## 2023-12-07 DIAGNOSIS — I10 Essential (primary) hypertension: Secondary | ICD-10-CM | POA: Diagnosis not present

## 2023-12-07 DIAGNOSIS — I700 Atherosclerosis of aorta: Secondary | ICD-10-CM | POA: Diagnosis not present

## 2023-12-07 DIAGNOSIS — E1142 Type 2 diabetes mellitus with diabetic polyneuropathy: Secondary | ICD-10-CM | POA: Diagnosis not present

## 2023-12-07 DIAGNOSIS — E785 Hyperlipidemia, unspecified: Secondary | ICD-10-CM | POA: Diagnosis not present

## 2023-12-11 DIAGNOSIS — F332 Major depressive disorder, recurrent severe without psychotic features: Secondary | ICD-10-CM | POA: Diagnosis not present

## 2023-12-18 DIAGNOSIS — F332 Major depressive disorder, recurrent severe without psychotic features: Secondary | ICD-10-CM | POA: Diagnosis not present

## 2023-12-25 ENCOUNTER — Other Ambulatory Visit: Payer: Self-pay | Admitting: Family Medicine

## 2023-12-25 DIAGNOSIS — Z1231 Encounter for screening mammogram for malignant neoplasm of breast: Secondary | ICD-10-CM

## 2023-12-27 DIAGNOSIS — F332 Major depressive disorder, recurrent severe without psychotic features: Secondary | ICD-10-CM | POA: Diagnosis not present

## 2023-12-28 DIAGNOSIS — F332 Major depressive disorder, recurrent severe without psychotic features: Secondary | ICD-10-CM | POA: Diagnosis not present

## 2024-01-01 DIAGNOSIS — F332 Major depressive disorder, recurrent severe without psychotic features: Secondary | ICD-10-CM | POA: Diagnosis not present

## 2024-01-02 DIAGNOSIS — F332 Major depressive disorder, recurrent severe without psychotic features: Secondary | ICD-10-CM | POA: Diagnosis not present

## 2024-01-08 DIAGNOSIS — F332 Major depressive disorder, recurrent severe without psychotic features: Secondary | ICD-10-CM | POA: Diagnosis not present

## 2024-01-10 DIAGNOSIS — F332 Major depressive disorder, recurrent severe without psychotic features: Secondary | ICD-10-CM | POA: Diagnosis not present

## 2024-01-14 DIAGNOSIS — F332 Major depressive disorder, recurrent severe without psychotic features: Secondary | ICD-10-CM | POA: Diagnosis not present

## 2024-01-15 ENCOUNTER — Ambulatory Visit
Admission: RE | Admit: 2024-01-15 | Discharge: 2024-01-15 | Disposition: A | Discharge status: Home / Self Care | Source: Ambulatory Visit | Attending: Family Medicine | Admitting: Family Medicine

## 2024-01-15 DIAGNOSIS — Z1231 Encounter for screening mammogram for malignant neoplasm of breast: Secondary | ICD-10-CM | POA: Diagnosis not present

## 2024-01-17 DIAGNOSIS — F332 Major depressive disorder, recurrent severe without psychotic features: Secondary | ICD-10-CM | POA: Diagnosis not present

## 2024-01-18 DIAGNOSIS — E1142 Type 2 diabetes mellitus with diabetic polyneuropathy: Secondary | ICD-10-CM | POA: Diagnosis not present

## 2024-01-18 DIAGNOSIS — E1140 Type 2 diabetes mellitus with diabetic neuropathy, unspecified: Secondary | ICD-10-CM | POA: Diagnosis not present

## 2024-01-18 DIAGNOSIS — I10 Essential (primary) hypertension: Secondary | ICD-10-CM | POA: Diagnosis not present

## 2024-01-22 DIAGNOSIS — F332 Major depressive disorder, recurrent severe without psychotic features: Secondary | ICD-10-CM | POA: Diagnosis not present

## 2024-01-24 DIAGNOSIS — F332 Major depressive disorder, recurrent severe without psychotic features: Secondary | ICD-10-CM | POA: Diagnosis not present

## 2024-01-26 DIAGNOSIS — F331 Major depressive disorder, recurrent, moderate: Secondary | ICD-10-CM | POA: Diagnosis not present

## 2024-01-26 DIAGNOSIS — F3175 Bipolar disorder, in partial remission, most recent episode depressed: Secondary | ICD-10-CM | POA: Diagnosis not present

## 2024-01-26 DIAGNOSIS — I10 Essential (primary) hypertension: Secondary | ICD-10-CM | POA: Diagnosis not present

## 2024-01-26 DIAGNOSIS — E1142 Type 2 diabetes mellitus with diabetic polyneuropathy: Secondary | ICD-10-CM | POA: Diagnosis not present

## 2024-01-29 DIAGNOSIS — F332 Major depressive disorder, recurrent severe without psychotic features: Secondary | ICD-10-CM | POA: Diagnosis not present

## 2024-01-31 DIAGNOSIS — F332 Major depressive disorder, recurrent severe without psychotic features: Secondary | ICD-10-CM | POA: Diagnosis not present

## 2024-02-04 DIAGNOSIS — F332 Major depressive disorder, recurrent severe without psychotic features: Secondary | ICD-10-CM | POA: Diagnosis not present

## 2024-02-05 DIAGNOSIS — F332 Major depressive disorder, recurrent severe without psychotic features: Secondary | ICD-10-CM | POA: Diagnosis not present

## 2024-02-07 DIAGNOSIS — F341 Dysthymic disorder: Secondary | ICD-10-CM | POA: Diagnosis not present

## 2024-02-07 DIAGNOSIS — F332 Major depressive disorder, recurrent severe without psychotic features: Secondary | ICD-10-CM | POA: Diagnosis not present

## 2024-02-07 DIAGNOSIS — F900 Attention-deficit hyperactivity disorder, predominantly inattentive type: Secondary | ICD-10-CM | POA: Diagnosis not present

## 2024-02-08 DIAGNOSIS — F332 Major depressive disorder, recurrent severe without psychotic features: Secondary | ICD-10-CM | POA: Diagnosis not present

## 2024-02-12 DIAGNOSIS — F332 Major depressive disorder, recurrent severe without psychotic features: Secondary | ICD-10-CM | POA: Diagnosis not present

## 2024-02-12 DIAGNOSIS — F341 Dysthymic disorder: Secondary | ICD-10-CM | POA: Diagnosis not present

## 2024-02-12 DIAGNOSIS — F900 Attention-deficit hyperactivity disorder, predominantly inattentive type: Secondary | ICD-10-CM | POA: Diagnosis not present

## 2024-02-13 DIAGNOSIS — Z1329 Encounter for screening for other suspected endocrine disorder: Secondary | ICD-10-CM | POA: Diagnosis not present

## 2024-02-13 DIAGNOSIS — Z130 Encounter for screening for diseases of the blood and blood-forming organs and certain disorders involving the immune mechanism: Secondary | ICD-10-CM | POA: Diagnosis not present

## 2024-02-13 DIAGNOSIS — Z79899 Other long term (current) drug therapy: Secondary | ICD-10-CM | POA: Diagnosis not present

## 2024-02-14 DIAGNOSIS — E7800 Pure hypercholesterolemia, unspecified: Secondary | ICD-10-CM | POA: Diagnosis not present

## 2024-02-14 DIAGNOSIS — E1165 Type 2 diabetes mellitus with hyperglycemia: Secondary | ICD-10-CM | POA: Diagnosis not present

## 2024-02-15 DIAGNOSIS — F332 Major depressive disorder, recurrent severe without psychotic features: Secondary | ICD-10-CM | POA: Diagnosis not present

## 2024-02-16 DIAGNOSIS — E1142 Type 2 diabetes mellitus with diabetic polyneuropathy: Secondary | ICD-10-CM | POA: Diagnosis not present

## 2024-02-16 DIAGNOSIS — E1140 Type 2 diabetes mellitus with diabetic neuropathy, unspecified: Secondary | ICD-10-CM | POA: Diagnosis not present

## 2024-02-16 DIAGNOSIS — I10 Essential (primary) hypertension: Secondary | ICD-10-CM | POA: Diagnosis not present

## 2024-02-18 DIAGNOSIS — F332 Major depressive disorder, recurrent severe without psychotic features: Secondary | ICD-10-CM | POA: Diagnosis not present

## 2024-02-20 DIAGNOSIS — F332 Major depressive disorder, recurrent severe without psychotic features: Secondary | ICD-10-CM | POA: Diagnosis not present

## 2024-02-21 DIAGNOSIS — E7800 Pure hypercholesterolemia, unspecified: Secondary | ICD-10-CM | POA: Diagnosis not present

## 2024-02-21 DIAGNOSIS — E1165 Type 2 diabetes mellitus with hyperglycemia: Secondary | ICD-10-CM | POA: Diagnosis not present

## 2024-02-21 DIAGNOSIS — I10 Essential (primary) hypertension: Secondary | ICD-10-CM | POA: Diagnosis not present

## 2024-02-21 DIAGNOSIS — E559 Vitamin D deficiency, unspecified: Secondary | ICD-10-CM | POA: Diagnosis not present

## 2024-02-25 DIAGNOSIS — F331 Major depressive disorder, recurrent, moderate: Secondary | ICD-10-CM | POA: Diagnosis not present

## 2024-02-25 DIAGNOSIS — I10 Essential (primary) hypertension: Secondary | ICD-10-CM | POA: Diagnosis not present

## 2024-02-25 DIAGNOSIS — E1142 Type 2 diabetes mellitus with diabetic polyneuropathy: Secondary | ICD-10-CM | POA: Diagnosis not present

## 2024-02-25 DIAGNOSIS — F3175 Bipolar disorder, in partial remission, most recent episode depressed: Secondary | ICD-10-CM | POA: Diagnosis not present

## 2024-02-26 DIAGNOSIS — F332 Major depressive disorder, recurrent severe without psychotic features: Secondary | ICD-10-CM | POA: Diagnosis not present

## 2024-02-28 DIAGNOSIS — F332 Major depressive disorder, recurrent severe without psychotic features: Secondary | ICD-10-CM | POA: Diagnosis not present

## 2024-03-04 DIAGNOSIS — F900 Attention-deficit hyperactivity disorder, predominantly inattentive type: Secondary | ICD-10-CM | POA: Diagnosis not present

## 2024-03-04 DIAGNOSIS — F332 Major depressive disorder, recurrent severe without psychotic features: Secondary | ICD-10-CM | POA: Diagnosis not present

## 2024-03-04 DIAGNOSIS — F341 Dysthymic disorder: Secondary | ICD-10-CM | POA: Diagnosis not present

## 2024-03-06 DIAGNOSIS — F332 Major depressive disorder, recurrent severe without psychotic features: Secondary | ICD-10-CM | POA: Diagnosis not present

## 2024-03-07 DIAGNOSIS — D509 Iron deficiency anemia, unspecified: Secondary | ICD-10-CM | POA: Diagnosis not present

## 2024-03-07 DIAGNOSIS — E785 Hyperlipidemia, unspecified: Secondary | ICD-10-CM | POA: Diagnosis not present

## 2024-03-07 DIAGNOSIS — I700 Atherosclerosis of aorta: Secondary | ICD-10-CM | POA: Diagnosis not present

## 2024-03-07 DIAGNOSIS — E538 Deficiency of other specified B group vitamins: Secondary | ICD-10-CM | POA: Diagnosis not present

## 2024-03-07 DIAGNOSIS — I10 Essential (primary) hypertension: Secondary | ICD-10-CM | POA: Diagnosis not present

## 2024-03-07 DIAGNOSIS — E559 Vitamin D deficiency, unspecified: Secondary | ICD-10-CM | POA: Diagnosis not present

## 2024-03-11 DIAGNOSIS — F332 Major depressive disorder, recurrent severe without psychotic features: Secondary | ICD-10-CM | POA: Diagnosis not present

## 2024-03-13 DIAGNOSIS — F332 Major depressive disorder, recurrent severe without psychotic features: Secondary | ICD-10-CM | POA: Diagnosis not present

## 2024-03-14 DIAGNOSIS — F332 Major depressive disorder, recurrent severe without psychotic features: Secondary | ICD-10-CM | POA: Diagnosis not present

## 2024-03-17 DIAGNOSIS — F332 Major depressive disorder, recurrent severe without psychotic features: Secondary | ICD-10-CM | POA: Diagnosis not present

## 2024-03-17 DIAGNOSIS — I10 Essential (primary) hypertension: Secondary | ICD-10-CM | POA: Diagnosis not present

## 2024-03-17 DIAGNOSIS — E1142 Type 2 diabetes mellitus with diabetic polyneuropathy: Secondary | ICD-10-CM | POA: Diagnosis not present

## 2024-03-17 DIAGNOSIS — E1140 Type 2 diabetes mellitus with diabetic neuropathy, unspecified: Secondary | ICD-10-CM | POA: Diagnosis not present

## 2024-03-19 DIAGNOSIS — F332 Major depressive disorder, recurrent severe without psychotic features: Secondary | ICD-10-CM | POA: Diagnosis not present

## 2024-03-24 DIAGNOSIS — F332 Major depressive disorder, recurrent severe without psychotic features: Secondary | ICD-10-CM | POA: Diagnosis not present

## 2024-03-27 ENCOUNTER — Other Ambulatory Visit: Payer: Self-pay

## 2024-03-27 DIAGNOSIS — E1142 Type 2 diabetes mellitus with diabetic polyneuropathy: Secondary | ICD-10-CM | POA: Diagnosis not present

## 2024-03-27 DIAGNOSIS — E1140 Type 2 diabetes mellitus with diabetic neuropathy, unspecified: Secondary | ICD-10-CM | POA: Diagnosis not present

## 2024-03-27 DIAGNOSIS — F331 Major depressive disorder, recurrent, moderate: Secondary | ICD-10-CM | POA: Diagnosis not present

## 2024-03-27 DIAGNOSIS — I10 Essential (primary) hypertension: Secondary | ICD-10-CM | POA: Diagnosis not present

## 2024-03-27 DIAGNOSIS — F3175 Bipolar disorder, in partial remission, most recent episode depressed: Secondary | ICD-10-CM | POA: Diagnosis not present

## 2024-03-27 MED ORDER — OMEPRAZOLE 40 MG PO CPDR: 40.0000 mg | DELAYED_RELEASE_CAPSULE | Freq: Two times a day (BID) | ORAL | 1 refills | Status: DC

## 2024-03-31 DIAGNOSIS — F332 Major depressive disorder, recurrent severe without psychotic features: Secondary | ICD-10-CM | POA: Diagnosis not present

## 2024-04-03 DIAGNOSIS — F332 Major depressive disorder, recurrent severe without psychotic features: Secondary | ICD-10-CM | POA: Diagnosis not present

## 2024-04-04 ENCOUNTER — Telehealth: Payer: Self-pay | Admitting: *Deleted

## 2024-04-04 MED ORDER — PANCRELIPASE (LIP-PROT-AMYL) 36000-114000 UNITS PO CPEP: ORAL_CAPSULE | ORAL | 1 refills | Status: AC

## 2024-04-04 NOTE — Telephone Encounter (Signed)
 Per Dr Clayburn last office visit note patient is to stay on current dose of creon  at CREON   36,000 UNIT 2 capsules by mouth 3 times daily with meals and 1 capsule as needed with snacks.   Sent Creon  rx electronically to [facility]

## 2024-04-04 NOTE — Telephone Encounter (Signed)
 Received fax request from pharmacy clarification on Creon  refills.

## 2024-04-07 DIAGNOSIS — F341 Dysthymic disorder: Secondary | ICD-10-CM | POA: Diagnosis not present

## 2024-04-07 DIAGNOSIS — F900 Attention-deficit hyperactivity disorder, predominantly inattentive type: Secondary | ICD-10-CM | POA: Diagnosis not present

## 2024-04-08 DIAGNOSIS — F332 Major depressive disorder, recurrent severe without psychotic features: Secondary | ICD-10-CM | POA: Diagnosis not present

## 2024-04-10 DIAGNOSIS — F332 Major depressive disorder, recurrent severe without psychotic features: Secondary | ICD-10-CM | POA: Diagnosis not present

## 2024-04-15 DIAGNOSIS — F332 Major depressive disorder, recurrent severe without psychotic features: Secondary | ICD-10-CM | POA: Diagnosis not present

## 2024-04-16 DIAGNOSIS — I10 Essential (primary) hypertension: Secondary | ICD-10-CM | POA: Diagnosis not present

## 2024-04-16 DIAGNOSIS — E1142 Type 2 diabetes mellitus with diabetic polyneuropathy: Secondary | ICD-10-CM | POA: Diagnosis not present

## 2024-04-16 DIAGNOSIS — E1140 Type 2 diabetes mellitus with diabetic neuropathy, unspecified: Secondary | ICD-10-CM | POA: Diagnosis not present

## 2024-04-18 DIAGNOSIS — F332 Major depressive disorder, recurrent severe without psychotic features: Secondary | ICD-10-CM | POA: Diagnosis not present

## 2024-04-24 DIAGNOSIS — Z01419 Encounter for gynecological examination (general) (routine) without abnormal findings: Secondary | ICD-10-CM | POA: Diagnosis not present

## 2024-04-24 DIAGNOSIS — Z124 Encounter for screening for malignant neoplasm of cervix: Secondary | ICD-10-CM | POA: Diagnosis not present

## 2024-04-27 DIAGNOSIS — E1142 Type 2 diabetes mellitus with diabetic polyneuropathy: Secondary | ICD-10-CM | POA: Diagnosis not present

## 2024-04-27 DIAGNOSIS — I10 Essential (primary) hypertension: Secondary | ICD-10-CM | POA: Diagnosis not present

## 2024-04-27 DIAGNOSIS — E1140 Type 2 diabetes mellitus with diabetic neuropathy, unspecified: Secondary | ICD-10-CM | POA: Diagnosis not present

## 2024-04-27 DIAGNOSIS — F331 Major depressive disorder, recurrent, moderate: Secondary | ICD-10-CM | POA: Diagnosis not present

## 2024-04-27 DIAGNOSIS — F3175 Bipolar disorder, in partial remission, most recent episode depressed: Secondary | ICD-10-CM | POA: Diagnosis not present

## 2024-04-29 DIAGNOSIS — F332 Major depressive disorder, recurrent severe without psychotic features: Secondary | ICD-10-CM | POA: Diagnosis not present

## 2024-05-01 DIAGNOSIS — F332 Major depressive disorder, recurrent severe without psychotic features: Secondary | ICD-10-CM | POA: Diagnosis not present

## 2024-05-06 DIAGNOSIS — F332 Major depressive disorder, recurrent severe without psychotic features: Secondary | ICD-10-CM | POA: Diagnosis not present

## 2024-05-08 DIAGNOSIS — F332 Major depressive disorder, recurrent severe without psychotic features: Secondary | ICD-10-CM | POA: Diagnosis not present

## 2024-05-13 DIAGNOSIS — F332 Major depressive disorder, recurrent severe without psychotic features: Secondary | ICD-10-CM | POA: Diagnosis not present

## 2024-05-15 DIAGNOSIS — F332 Major depressive disorder, recurrent severe without psychotic features: Secondary | ICD-10-CM | POA: Diagnosis not present

## 2024-05-16 DIAGNOSIS — E1142 Type 2 diabetes mellitus with diabetic polyneuropathy: Secondary | ICD-10-CM | POA: Diagnosis not present

## 2024-05-16 DIAGNOSIS — E1140 Type 2 diabetes mellitus with diabetic neuropathy, unspecified: Secondary | ICD-10-CM | POA: Diagnosis not present

## 2024-05-16 DIAGNOSIS — I10 Essential (primary) hypertension: Secondary | ICD-10-CM | POA: Diagnosis not present

## 2024-05-20 DIAGNOSIS — F332 Major depressive disorder, recurrent severe without psychotic features: Secondary | ICD-10-CM | POA: Diagnosis not present

## 2024-05-22 DIAGNOSIS — F332 Major depressive disorder, recurrent severe without psychotic features: Secondary | ICD-10-CM | POA: Diagnosis not present

## 2024-05-27 DIAGNOSIS — E1140 Type 2 diabetes mellitus with diabetic neuropathy, unspecified: Secondary | ICD-10-CM | POA: Diagnosis not present

## 2024-05-27 DIAGNOSIS — I10 Essential (primary) hypertension: Secondary | ICD-10-CM | POA: Diagnosis not present

## 2024-05-27 DIAGNOSIS — F3175 Bipolar disorder, in partial remission, most recent episode depressed: Secondary | ICD-10-CM | POA: Diagnosis not present

## 2024-05-27 DIAGNOSIS — E1142 Type 2 diabetes mellitus with diabetic polyneuropathy: Secondary | ICD-10-CM | POA: Diagnosis not present

## 2024-05-27 DIAGNOSIS — F332 Major depressive disorder, recurrent severe without psychotic features: Secondary | ICD-10-CM | POA: Diagnosis not present

## 2024-05-29 DIAGNOSIS — F332 Major depressive disorder, recurrent severe without psychotic features: Secondary | ICD-10-CM | POA: Diagnosis not present

## 2024-06-02 DIAGNOSIS — F411 Generalized anxiety disorder: Secondary | ICD-10-CM | POA: Diagnosis not present

## 2024-06-02 DIAGNOSIS — F3341 Major depressive disorder, recurrent, in partial remission: Secondary | ICD-10-CM | POA: Diagnosis not present

## 2024-06-02 DIAGNOSIS — F900 Attention-deficit hyperactivity disorder, predominantly inattentive type: Secondary | ICD-10-CM | POA: Diagnosis not present

## 2024-06-02 DIAGNOSIS — G2401 Drug induced subacute dyskinesia: Secondary | ICD-10-CM | POA: Diagnosis not present

## 2024-06-03 DIAGNOSIS — F332 Major depressive disorder, recurrent severe without psychotic features: Secondary | ICD-10-CM | POA: Diagnosis not present

## 2024-06-05 DIAGNOSIS — F332 Major depressive disorder, recurrent severe without psychotic features: Secondary | ICD-10-CM | POA: Diagnosis not present

## 2024-06-09 DIAGNOSIS — F332 Major depressive disorder, recurrent severe without psychotic features: Secondary | ICD-10-CM | POA: Diagnosis not present

## 2024-06-10 DIAGNOSIS — E785 Hyperlipidemia, unspecified: Secondary | ICD-10-CM | POA: Diagnosis not present

## 2024-06-10 DIAGNOSIS — F3175 Bipolar disorder, in partial remission, most recent episode depressed: Secondary | ICD-10-CM | POA: Diagnosis not present

## 2024-06-10 DIAGNOSIS — Z23 Encounter for immunization: Secondary | ICD-10-CM | POA: Diagnosis not present

## 2024-06-10 DIAGNOSIS — I10 Essential (primary) hypertension: Secondary | ICD-10-CM | POA: Diagnosis not present

## 2024-06-10 DIAGNOSIS — R945 Abnormal results of liver function studies: Secondary | ICD-10-CM | POA: Diagnosis not present

## 2024-06-10 DIAGNOSIS — E1142 Type 2 diabetes mellitus with diabetic polyneuropathy: Secondary | ICD-10-CM | POA: Diagnosis not present

## 2024-06-12 DIAGNOSIS — F332 Major depressive disorder, recurrent severe without psychotic features: Secondary | ICD-10-CM | POA: Diagnosis not present

## 2024-06-17 DIAGNOSIS — F332 Major depressive disorder, recurrent severe without psychotic features: Secondary | ICD-10-CM | POA: Diagnosis not present

## 2024-06-19 DIAGNOSIS — F332 Major depressive disorder, recurrent severe without psychotic features: Secondary | ICD-10-CM | POA: Diagnosis not present

## 2024-06-24 DIAGNOSIS — E1142 Type 2 diabetes mellitus with diabetic polyneuropathy: Secondary | ICD-10-CM | POA: Diagnosis not present

## 2024-06-24 DIAGNOSIS — E1140 Type 2 diabetes mellitus with diabetic neuropathy, unspecified: Secondary | ICD-10-CM | POA: Diagnosis not present

## 2024-06-24 DIAGNOSIS — I10 Essential (primary) hypertension: Secondary | ICD-10-CM | POA: Diagnosis not present

## 2024-06-24 DIAGNOSIS — F332 Major depressive disorder, recurrent severe without psychotic features: Secondary | ICD-10-CM | POA: Diagnosis not present

## 2024-06-26 DIAGNOSIS — F332 Major depressive disorder, recurrent severe without psychotic features: Secondary | ICD-10-CM | POA: Diagnosis not present

## 2024-06-27 DIAGNOSIS — F3175 Bipolar disorder, in partial remission, most recent episode depressed: Secondary | ICD-10-CM | POA: Diagnosis not present

## 2024-06-27 DIAGNOSIS — E1140 Type 2 diabetes mellitus with diabetic neuropathy, unspecified: Secondary | ICD-10-CM | POA: Diagnosis not present

## 2024-06-27 DIAGNOSIS — E1142 Type 2 diabetes mellitus with diabetic polyneuropathy: Secondary | ICD-10-CM | POA: Diagnosis not present

## 2024-06-27 DIAGNOSIS — F331 Major depressive disorder, recurrent, moderate: Secondary | ICD-10-CM | POA: Diagnosis not present

## 2024-06-27 DIAGNOSIS — I10 Essential (primary) hypertension: Secondary | ICD-10-CM | POA: Diagnosis not present

## 2024-07-08 DIAGNOSIS — F332 Major depressive disorder, recurrent severe without psychotic features: Secondary | ICD-10-CM | POA: Diagnosis not present

## 2024-07-10 DIAGNOSIS — F332 Major depressive disorder, recurrent severe without psychotic features: Secondary | ICD-10-CM | POA: Diagnosis not present

## 2024-07-15 DIAGNOSIS — F332 Major depressive disorder, recurrent severe without psychotic features: Secondary | ICD-10-CM | POA: Diagnosis not present

## 2024-07-17 DIAGNOSIS — F332 Major depressive disorder, recurrent severe without psychotic features: Secondary | ICD-10-CM | POA: Diagnosis not present

## 2024-07-21 DIAGNOSIS — F332 Major depressive disorder, recurrent severe without psychotic features: Secondary | ICD-10-CM | POA: Diagnosis not present

## 2024-07-23 DIAGNOSIS — F332 Major depressive disorder, recurrent severe without psychotic features: Secondary | ICD-10-CM | POA: Diagnosis not present

## 2024-07-27 DIAGNOSIS — E1140 Type 2 diabetes mellitus with diabetic neuropathy, unspecified: Secondary | ICD-10-CM | POA: Diagnosis not present

## 2024-07-27 DIAGNOSIS — F3175 Bipolar disorder, in partial remission, most recent episode depressed: Secondary | ICD-10-CM | POA: Diagnosis not present

## 2024-07-27 DIAGNOSIS — E1142 Type 2 diabetes mellitus with diabetic polyneuropathy: Secondary | ICD-10-CM | POA: Diagnosis not present

## 2024-07-27 DIAGNOSIS — F331 Major depressive disorder, recurrent, moderate: Secondary | ICD-10-CM | POA: Diagnosis not present

## 2024-07-27 DIAGNOSIS — I10 Essential (primary) hypertension: Secondary | ICD-10-CM | POA: Diagnosis not present

## 2024-07-29 DIAGNOSIS — F332 Major depressive disorder, recurrent severe without psychotic features: Secondary | ICD-10-CM | POA: Diagnosis not present

## 2024-07-31 DIAGNOSIS — F332 Major depressive disorder, recurrent severe without psychotic features: Secondary | ICD-10-CM | POA: Diagnosis not present

## 2024-08-04 DIAGNOSIS — F3341 Major depressive disorder, recurrent, in partial remission: Secondary | ICD-10-CM | POA: Diagnosis not present

## 2024-08-04 DIAGNOSIS — F900 Attention-deficit hyperactivity disorder, predominantly inattentive type: Secondary | ICD-10-CM | POA: Diagnosis not present

## 2024-08-04 DIAGNOSIS — G2401 Drug induced subacute dyskinesia: Secondary | ICD-10-CM | POA: Diagnosis not present

## 2024-08-05 DIAGNOSIS — F332 Major depressive disorder, recurrent severe without psychotic features: Secondary | ICD-10-CM | POA: Diagnosis not present

## 2024-08-07 DIAGNOSIS — F332 Major depressive disorder, recurrent severe without psychotic features: Secondary | ICD-10-CM | POA: Diagnosis not present

## 2024-08-08 DIAGNOSIS — E1165 Type 2 diabetes mellitus with hyperglycemia: Secondary | ICD-10-CM | POA: Diagnosis not present

## 2024-08-08 DIAGNOSIS — I10 Essential (primary) hypertension: Secondary | ICD-10-CM | POA: Diagnosis not present

## 2024-08-08 DIAGNOSIS — E7800 Pure hypercholesterolemia, unspecified: Secondary | ICD-10-CM | POA: Diagnosis not present

## 2024-08-08 DIAGNOSIS — E559 Vitamin D deficiency, unspecified: Secondary | ICD-10-CM | POA: Diagnosis not present

## 2024-08-12 DIAGNOSIS — F332 Major depressive disorder, recurrent severe without psychotic features: Secondary | ICD-10-CM | POA: Diagnosis not present

## 2024-08-14 DIAGNOSIS — F332 Major depressive disorder, recurrent severe without psychotic features: Secondary | ICD-10-CM | POA: Diagnosis not present

## 2024-09-18 ENCOUNTER — Other Ambulatory Visit: Payer: Self-pay

## 2024-09-18 MED ORDER — OMEPRAZOLE 40 MG PO CPDR: 40.0000 mg | DELAYED_RELEASE_CAPSULE | Freq: Two times a day (BID) | ORAL | 0 refills | Status: AC

## 2024-09-29 ENCOUNTER — Ambulatory Visit: Admitting: Podiatry

## 2024-10-01 ENCOUNTER — Ambulatory Visit: Admitting: Podiatry

## 2024-10-01 ENCOUNTER — Encounter: Payer: Self-pay | Admitting: Podiatry

## 2024-10-01 ENCOUNTER — Ambulatory Visit (INDEPENDENT_AMBULATORY_CARE_PROVIDER_SITE_OTHER)

## 2024-10-01 DIAGNOSIS — M2042 Other hammer toe(s) (acquired), left foot: Secondary | ICD-10-CM | POA: Diagnosis not present

## 2024-10-01 DIAGNOSIS — M2041 Other hammer toe(s) (acquired), right foot: Secondary | ICD-10-CM | POA: Diagnosis not present

## 2024-10-01 DIAGNOSIS — E1140 Type 2 diabetes mellitus with diabetic neuropathy, unspecified: Secondary | ICD-10-CM

## 2024-10-01 DIAGNOSIS — L84 Corns and callosities: Secondary | ICD-10-CM

## 2024-10-02 NOTE — Progress Notes (Signed)
 Subjective:   Patient ID: Amy [name], female   DOB: 69 y.o.   MRN: [id number]   HPI Patient presents with painful hammertoe deformity digits 2 4 right digit 4 left states that they have gotten gradually worse over the years and does not remember specific injury.  States that it is becoming increasingly more aggravating for her.  Patient does not smoke likes to be active.  Patient is a diabetic sugar and not very good control A1c approximately 9   Review of Systems  All other systems reviewed and are negative.       Objective:  Physical Exam Vitals and nursing note reviewed.  Constitutional:      Appearance: She is well-developed.  Pulmonary:     Effort: Pulmonary effort is normal.  Musculoskeletal:        General: Normal range of motion.  Skin:    General: Skin is warm.  Neurological:     Mental Status: She is alert.     Neurovascular status indicates good pulses PT and DP but patient does have diminishment of sharp dull vibratory 25-year history of diabetes mild tingling burning at times.  Patient is noted to have elevated digits with keratotic lesions digits 2 4 right digit 4 left that are painful when pressed and make shoe gear difficult.  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  I would want her to get her sugar in better control and I strongly encourage no matter what that she does work on reducing her A1c  X-rays indicate moderate elevation of the digits bilateral no indications of arthritis no indications of other pathology
